# Patient Record
Sex: Female | Born: 1960 | Race: White | Hispanic: No | Marital: Single | Smoking: Current every day smoker
Health system: Southern US, Community
[De-identification: ages and names within clinical notes are randomized; demographics above are authoritative.]

## PROBLEM LIST (undated history)

## (undated) DIAGNOSIS — E119 Type 2 diabetes mellitus without complications: Secondary | ICD-10-CM

## (undated) DIAGNOSIS — F329 Major depressive disorder, single episode, unspecified: Secondary | ICD-10-CM

## (undated) DIAGNOSIS — M1990 Unspecified osteoarthritis, unspecified site: Secondary | ICD-10-CM

## (undated) DIAGNOSIS — E1142 Type 2 diabetes mellitus with diabetic polyneuropathy: Secondary | ICD-10-CM

## (undated) DIAGNOSIS — E7800 Pure hypercholesterolemia, unspecified: Secondary | ICD-10-CM

## (undated) DIAGNOSIS — F419 Anxiety disorder, unspecified: Secondary | ICD-10-CM

## (undated) DIAGNOSIS — D519 Vitamin B12 deficiency anemia, unspecified: Secondary | ICD-10-CM

## (undated) DIAGNOSIS — C50919 Malignant neoplasm of unspecified site of unspecified female breast: Secondary | ICD-10-CM

## (undated) DIAGNOSIS — M069 Rheumatoid arthritis, unspecified: Secondary | ICD-10-CM

## (undated) DIAGNOSIS — D0502 Lobular carcinoma in situ of left breast: Secondary | ICD-10-CM

## (undated) DIAGNOSIS — M545 Low back pain, unspecified: Secondary | ICD-10-CM

## (undated) DIAGNOSIS — G8929 Other chronic pain: Secondary | ICD-10-CM

## (undated) DIAGNOSIS — M109 Gout, unspecified: Secondary | ICD-10-CM

## (undated) DIAGNOSIS — F32A Depression, unspecified: Secondary | ICD-10-CM

## (undated) DIAGNOSIS — G709 Myoneural disorder, unspecified: Secondary | ICD-10-CM

## (undated) HISTORY — PX: TONSILLECTOMY: SUR1361

## (undated) HISTORY — DX: Gout, unspecified: M10.9

## (undated) HISTORY — DX: Lobular carcinoma in situ of left breast: D05.02

## (undated) HISTORY — PX: ABDOMINAL HYSTERECTOMY: SHX81

## (undated) HISTORY — PX: DILATION AND CURETTAGE OF UTERUS: SHX78

## (undated) HISTORY — PX: CHOLECYSTECTOMY: SHX55

## (undated) HISTORY — DX: Rheumatoid arthritis, unspecified: M06.9

## (undated) HISTORY — PX: FRACTURE SURGERY: SHX138

---

## 1999-03-13 HISTORY — PX: BACK SURGERY: SHX140

## 2004-11-19 ENCOUNTER — Emergency Department: Payer: Self-pay | Admitting: General Practice

## 2006-03-12 ENCOUNTER — Emergency Department: Payer: Self-pay

## 2007-02-24 ENCOUNTER — Emergency Department (HOSPITAL_COMMUNITY): Admission: EM | Admit: 2007-02-24 | Discharge: 2007-02-25 | Payer: Self-pay | Admitting: Emergency Medicine

## 2007-07-09 ENCOUNTER — Emergency Department (HOSPITAL_COMMUNITY): Admission: EM | Admit: 2007-07-09 | Discharge: 2007-07-09 | Payer: Self-pay | Admitting: Emergency Medicine

## 2008-08-03 ENCOUNTER — Emergency Department: Payer: Self-pay | Admitting: Emergency Medicine

## 2008-08-09 ENCOUNTER — Emergency Department: Payer: Self-pay

## 2008-09-05 ENCOUNTER — Emergency Department (HOSPITAL_COMMUNITY): Admission: EM | Admit: 2008-09-05 | Discharge: 2008-09-05 | Payer: Self-pay | Admitting: Emergency Medicine

## 2008-09-08 ENCOUNTER — Emergency Department (HOSPITAL_COMMUNITY): Admission: EM | Admit: 2008-09-08 | Discharge: 2008-09-08 | Payer: Self-pay | Admitting: Emergency Medicine

## 2008-09-11 ENCOUNTER — Emergency Department (HOSPITAL_COMMUNITY): Admission: EM | Admit: 2008-09-11 | Discharge: 2008-09-11 | Payer: Self-pay | Admitting: Emergency Medicine

## 2009-02-27 ENCOUNTER — Emergency Department: Payer: Self-pay | Admitting: Emergency Medicine

## 2009-06-06 ENCOUNTER — Emergency Department: Payer: Self-pay | Admitting: Internal Medicine

## 2009-06-12 ENCOUNTER — Emergency Department: Payer: Self-pay | Admitting: Emergency Medicine

## 2009-10-31 ENCOUNTER — Emergency Department: Payer: Self-pay | Admitting: Emergency Medicine

## 2009-11-18 ENCOUNTER — Emergency Department: Payer: Self-pay | Admitting: Emergency Medicine

## 2010-03-12 HISTORY — PX: ANTERIOR CERVICAL DECOMP/DISCECTOMY FUSION: SHX1161

## 2010-06-01 ENCOUNTER — Emergency Department (HOSPITAL_COMMUNITY)
Admission: EM | Admit: 2010-06-01 | Discharge: 2010-06-01 | Disposition: A | Discharge status: Home / Self Care | Payer: Medicaid Other | Attending: Emergency Medicine | Admitting: Emergency Medicine

## 2010-06-01 ENCOUNTER — Emergency Department (HOSPITAL_COMMUNITY): Payer: Medicaid Other

## 2010-06-01 DIAGNOSIS — G8929 Other chronic pain: Secondary | ICD-10-CM | POA: Insufficient documentation

## 2010-06-01 DIAGNOSIS — Y92009 Unspecified place in unspecified non-institutional (private) residence as the place of occurrence of the external cause: Secondary | ICD-10-CM | POA: Insufficient documentation

## 2010-06-01 DIAGNOSIS — M546 Pain in thoracic spine: Secondary | ICD-10-CM | POA: Insufficient documentation

## 2010-06-01 DIAGNOSIS — Z79899 Other long term (current) drug therapy: Secondary | ICD-10-CM | POA: Insufficient documentation

## 2010-06-01 DIAGNOSIS — M542 Cervicalgia: Secondary | ICD-10-CM | POA: Insufficient documentation

## 2010-06-01 DIAGNOSIS — E119 Type 2 diabetes mellitus without complications: Secondary | ICD-10-CM | POA: Insufficient documentation

## 2010-06-01 DIAGNOSIS — E7800 Pure hypercholesterolemia, unspecified: Secondary | ICD-10-CM | POA: Insufficient documentation

## 2010-06-01 DIAGNOSIS — S335XXA Sprain of ligaments of lumbar spine, initial encounter: Secondary | ICD-10-CM | POA: Insufficient documentation

## 2010-06-01 DIAGNOSIS — M545 Low back pain, unspecified: Secondary | ICD-10-CM | POA: Insufficient documentation

## 2010-06-01 DIAGNOSIS — W19XXXA Unspecified fall, initial encounter: Secondary | ICD-10-CM | POA: Insufficient documentation

## 2010-06-19 LAB — DIFFERENTIAL
Basophils Absolute: 0.1 10*3/uL (ref 0.0–0.1)
Basophils Absolute: 0.1 10*3/uL (ref 0.0–0.1)
Basophils Relative: 1 % (ref 0–1)
Basophils Relative: 1 % (ref 0–1)
Eosinophils Absolute: 0.2 10*3/uL (ref 0.0–0.7)
Eosinophils Absolute: 0.4 10*3/uL (ref 0.0–0.7)
Eosinophils Relative: 2 % (ref 0–5)
Eosinophils Relative: 5 % (ref 0–5)
Lymphocytes Relative: 19 % (ref 12–46)
Lymphocytes Relative: 24 % (ref 12–46)
Lymphs Abs: 2 10*3/uL (ref 0.7–4.0)
Lymphs Abs: 2 10*3/uL (ref 0.7–4.0)
Monocytes Absolute: 0.5 10*3/uL (ref 0.1–1.0)
Monocytes Absolute: 0.7 10*3/uL (ref 0.1–1.0)
Monocytes Relative: 6 % (ref 3–12)
Monocytes Relative: 7 % (ref 3–12)
Neutro Abs: 5.4 10*3/uL (ref 1.7–7.7)
Neutro Abs: 7.4 10*3/uL (ref 1.7–7.7)
Neutrophils Relative %: 65 % (ref 43–77)
Neutrophils Relative %: 72 % (ref 43–77)

## 2010-06-19 LAB — COMPREHENSIVE METABOLIC PANEL
ALT: 25 U/L (ref 0–35)
AST: 28 U/L (ref 0–37)
Albumin: 4 g/dL (ref 3.5–5.2)
Alkaline Phosphatase: 86 U/L (ref 39–117)
BUN: 8 mg/dL (ref 6–23)
CO2: 22 mEq/L (ref 19–32)
Calcium: 9.5 mg/dL (ref 8.4–10.5)
Chloride: 105 mEq/L (ref 96–112)
Creatinine, Ser: 0.74 mg/dL (ref 0.4–1.2)
GFR calc Af Amer: >60 mL/min (ref >60)
GFR calc non Af Amer: >60 mL/min (ref >60)
Glucose, Bld: 119 mg/dL — ABNORMAL HIGH (ref 70–99)
Potassium: 3.8 mEq/L (ref 3.5–5.1)
Sodium: 137 mEq/L (ref 135–145)
Total Bilirubin: 0.6 mg/dL (ref 0.3–1.2)
Total Protein: 7.3 g/dL (ref 6.0–8.3)

## 2010-06-19 LAB — CBC
HCT: 38.8 % (ref 36.0–46.0)
HCT: 41.4 % (ref 36.0–46.0)
Hemoglobin: 13.2 g/dL (ref 12.0–15.0)
Hemoglobin: 13.9 g/dL (ref 12.0–15.0)
MCHC: 33.6 g/dL (ref 30.0–36.0)
MCHC: 34 g/dL (ref 30.0–36.0)
MCV: 88.5 fL (ref 78.0–100.0)
MCV: 89.2 fL (ref 78.0–100.0)
Platelets: 263 10*3/uL (ref 150–400)
Platelets: 308 10*3/uL (ref 150–400)
RBC: 4.34 MIL/uL (ref 3.87–5.11)
RBC: 4.68 MIL/uL (ref 3.87–5.11)
RDW: 14.5 % (ref 11.5–15.5)
RDW: 14.7 % (ref 11.5–15.5)
WBC: 10.3 10*3/uL (ref 4.0–10.5)
WBC: 8.4 10*3/uL (ref 4.0–10.5)

## 2010-06-19 LAB — BASIC METABOLIC PANEL
BUN: 8 mg/dL (ref 6–23)
CO2: 26 mEq/L (ref 19–32)
Calcium: 9.2 mg/dL (ref 8.4–10.5)
Chloride: 107 mEq/L (ref 96–112)
Creatinine, Ser: 0.59 mg/dL (ref 0.4–1.2)
GFR calc Af Amer: >60 mL/min (ref >60)
GFR calc non Af Amer: >60 mL/min (ref >60)
Glucose, Bld: 139 mg/dL — ABNORMAL HIGH (ref 70–99)
Potassium: 4 mEq/L (ref 3.5–5.1)
Sodium: 140 mEq/L (ref 135–145)

## 2010-06-19 LAB — CULTURE, BLOOD (ROUTINE X 2): Culture: NO GROWTH

## 2010-07-09 ENCOUNTER — Emergency Department: Payer: Self-pay | Admitting: *Deleted

## 2010-07-28 ENCOUNTER — Ambulatory Visit: Payer: Self-pay | Admitting: Internal Medicine

## 2010-10-14 ENCOUNTER — Emergency Department (HOSPITAL_COMMUNITY)
Admission: EM | Admit: 2010-10-14 | Discharge: 2010-10-14 | Disposition: A | Discharge status: Home / Self Care | Payer: Medicaid Other | Attending: Emergency Medicine | Admitting: Emergency Medicine

## 2010-10-14 DIAGNOSIS — M5430 Sciatica, unspecified side: Secondary | ICD-10-CM | POA: Insufficient documentation

## 2010-10-14 DIAGNOSIS — M545 Low back pain, unspecified: Secondary | ICD-10-CM | POA: Insufficient documentation

## 2010-10-14 DIAGNOSIS — M79609 Pain in unspecified limb: Secondary | ICD-10-CM | POA: Insufficient documentation

## 2010-10-14 DIAGNOSIS — E119 Type 2 diabetes mellitus without complications: Secondary | ICD-10-CM | POA: Insufficient documentation

## 2010-10-14 DIAGNOSIS — G8929 Other chronic pain: Secondary | ICD-10-CM | POA: Insufficient documentation

## 2010-10-14 DIAGNOSIS — Z79899 Other long term (current) drug therapy: Secondary | ICD-10-CM | POA: Insufficient documentation

## 2010-10-14 DIAGNOSIS — E7800 Pure hypercholesterolemia, unspecified: Secondary | ICD-10-CM | POA: Insufficient documentation

## 2010-10-27 ENCOUNTER — Emergency Department (HOSPITAL_COMMUNITY)
Admission: EM | Admit: 2010-10-27 | Discharge: 2010-10-27 | Disposition: A | Discharge status: Home / Self Care | Payer: Medicaid Other | Attending: Emergency Medicine | Admitting: Emergency Medicine

## 2010-10-27 DIAGNOSIS — M545 Low back pain, unspecified: Secondary | ICD-10-CM | POA: Insufficient documentation

## 2010-10-27 DIAGNOSIS — E119 Type 2 diabetes mellitus without complications: Secondary | ICD-10-CM | POA: Insufficient documentation

## 2010-10-27 DIAGNOSIS — G8929 Other chronic pain: Secondary | ICD-10-CM | POA: Insufficient documentation

## 2010-10-27 DIAGNOSIS — E789 Disorder of lipoprotein metabolism, unspecified: Secondary | ICD-10-CM | POA: Insufficient documentation

## 2010-10-27 DIAGNOSIS — M79609 Pain in unspecified limb: Secondary | ICD-10-CM | POA: Insufficient documentation

## 2010-10-27 DIAGNOSIS — Z9889 Other specified postprocedural states: Secondary | ICD-10-CM | POA: Insufficient documentation

## 2010-10-27 DIAGNOSIS — Z79899 Other long term (current) drug therapy: Secondary | ICD-10-CM | POA: Insufficient documentation

## 2010-10-27 DIAGNOSIS — M5430 Sciatica, unspecified side: Secondary | ICD-10-CM | POA: Insufficient documentation

## 2010-11-24 ENCOUNTER — Emergency Department (HOSPITAL_COMMUNITY)
Admission: EM | Admit: 2010-11-24 | Discharge: 2010-11-24 | Disposition: A | Discharge status: Home / Self Care | Payer: Medicaid Other | Attending: Emergency Medicine | Admitting: Emergency Medicine

## 2010-11-24 DIAGNOSIS — G8929 Other chronic pain: Secondary | ICD-10-CM | POA: Insufficient documentation

## 2010-11-24 DIAGNOSIS — M545 Low back pain, unspecified: Secondary | ICD-10-CM | POA: Insufficient documentation

## 2010-11-24 DIAGNOSIS — E119 Type 2 diabetes mellitus without complications: Secondary | ICD-10-CM | POA: Insufficient documentation

## 2010-11-24 DIAGNOSIS — E7800 Pure hypercholesterolemia, unspecified: Secondary | ICD-10-CM | POA: Insufficient documentation

## 2010-11-24 DIAGNOSIS — Z79899 Other long term (current) drug therapy: Secondary | ICD-10-CM | POA: Insufficient documentation

## 2010-12-06 ENCOUNTER — Other Ambulatory Visit: Payer: Self-pay | Admitting: *Deleted

## 2010-12-06 MED ORDER — CYANOCOBALAMIN 1000 MCG/ML IJ SOLN: 1000.0000 ug | INTRAMUSCULAR | Status: DC

## 2010-12-15 ENCOUNTER — Emergency Department (HOSPITAL_COMMUNITY)
Admission: EM | Admit: 2010-12-15 | Discharge: 2010-12-15 | Disposition: A | Discharge status: Home / Self Care | Payer: Medicaid Other | Attending: Emergency Medicine | Admitting: Emergency Medicine

## 2010-12-15 DIAGNOSIS — Z9889 Other specified postprocedural states: Secondary | ICD-10-CM | POA: Insufficient documentation

## 2010-12-15 DIAGNOSIS — G8929 Other chronic pain: Secondary | ICD-10-CM | POA: Insufficient documentation

## 2010-12-15 DIAGNOSIS — F329 Major depressive disorder, single episode, unspecified: Secondary | ICD-10-CM | POA: Insufficient documentation

## 2010-12-15 DIAGNOSIS — Z79899 Other long term (current) drug therapy: Secondary | ICD-10-CM | POA: Insufficient documentation

## 2010-12-15 DIAGNOSIS — M79609 Pain in unspecified limb: Secondary | ICD-10-CM | POA: Insufficient documentation

## 2010-12-15 DIAGNOSIS — E789 Disorder of lipoprotein metabolism, unspecified: Secondary | ICD-10-CM | POA: Insufficient documentation

## 2010-12-15 DIAGNOSIS — F3289 Other specified depressive episodes: Secondary | ICD-10-CM | POA: Insufficient documentation

## 2010-12-15 DIAGNOSIS — M545 Low back pain, unspecified: Secondary | ICD-10-CM | POA: Insufficient documentation

## 2010-12-15 DIAGNOSIS — M542 Cervicalgia: Secondary | ICD-10-CM | POA: Insufficient documentation

## 2010-12-15 DIAGNOSIS — E119 Type 2 diabetes mellitus without complications: Secondary | ICD-10-CM | POA: Insufficient documentation

## 2010-12-25 ENCOUNTER — Emergency Department: Payer: Self-pay | Admitting: Emergency Medicine

## 2011-01-01 ENCOUNTER — Emergency Department: Payer: Self-pay | Admitting: Emergency Medicine

## 2011-01-08 ENCOUNTER — Ambulatory Visit (HOSPITAL_BASED_OUTPATIENT_CLINIC_OR_DEPARTMENT_OTHER): Payer: Medicaid Other | Admitting: Physical Medicine & Rehabilitation

## 2011-01-08 ENCOUNTER — Encounter: Payer: Medicaid Other | Attending: Physical Medicine & Rehabilitation

## 2011-01-08 DIAGNOSIS — M542 Cervicalgia: Secondary | ICD-10-CM | POA: Insufficient documentation

## 2011-01-08 DIAGNOSIS — M4712 Other spondylosis with myelopathy, cervical region: Secondary | ICD-10-CM | POA: Insufficient documentation

## 2011-01-08 DIAGNOSIS — M47817 Spondylosis without myelopathy or radiculopathy, lumbosacral region: Secondary | ICD-10-CM

## 2011-01-08 DIAGNOSIS — M549 Dorsalgia, unspecified: Secondary | ICD-10-CM | POA: Insufficient documentation

## 2011-01-08 DIAGNOSIS — M25519 Pain in unspecified shoulder: Secondary | ICD-10-CM | POA: Insufficient documentation

## 2011-01-08 DIAGNOSIS — M79609 Pain in unspecified limb: Secondary | ICD-10-CM | POA: Insufficient documentation

## 2011-01-08 DIAGNOSIS — R209 Unspecified disturbances of skin sensation: Secondary | ICD-10-CM | POA: Insufficient documentation
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
## 2011-01-16 ENCOUNTER — Ambulatory Visit: Payer: Self-pay | Admitting: Internal Medicine

## 2011-01-25 ENCOUNTER — Ambulatory Visit: Payer: Self-pay | Admitting: Internal Medicine

## 2011-01-30 ENCOUNTER — Ambulatory Visit: Payer: Medicaid Other | Admitting: Physical Medicine & Rehabilitation

## 2011-02-06 ENCOUNTER — Emergency Department: Payer: Self-pay | Admitting: Emergency Medicine

## 2011-05-31 ENCOUNTER — Ambulatory Visit: Payer: Self-pay | Admitting: Specialist

## 2011-05-31 DIAGNOSIS — D229 Melanocytic nevi, unspecified: Secondary | ICD-10-CM | POA: Insufficient documentation

## 2011-05-31 DIAGNOSIS — D219 Benign neoplasm of connective and other soft tissue, unspecified: Secondary | ICD-10-CM | POA: Insufficient documentation

## 2011-06-28 DIAGNOSIS — R944 Abnormal results of kidney function studies: Secondary | ICD-10-CM | POA: Insufficient documentation

## 2011-07-02 ENCOUNTER — Ambulatory Visit: Payer: Self-pay | Admitting: Nephrology

## 2011-07-22 ENCOUNTER — Emergency Department: Payer: Self-pay | Admitting: Emergency Medicine

## 2011-09-03 ENCOUNTER — Emergency Department: Payer: Self-pay | Admitting: Emergency Medicine

## 2011-10-12 DIAGNOSIS — E1142 Type 2 diabetes mellitus with diabetic polyneuropathy: Secondary | ICD-10-CM | POA: Insufficient documentation

## 2011-12-25 ENCOUNTER — Emergency Department: Payer: Self-pay | Admitting: Unknown Physician Specialty

## 2012-01-14 DIAGNOSIS — E785 Hyperlipidemia, unspecified: Secondary | ICD-10-CM | POA: Insufficient documentation

## 2012-01-14 DIAGNOSIS — M549 Dorsalgia, unspecified: Secondary | ICD-10-CM | POA: Insufficient documentation

## 2012-01-14 DIAGNOSIS — I10 Essential (primary) hypertension: Secondary | ICD-10-CM | POA: Insufficient documentation

## 2012-01-14 DIAGNOSIS — Z23 Encounter for immunization: Secondary | ICD-10-CM | POA: Insufficient documentation

## 2012-01-14 DIAGNOSIS — E119 Type 2 diabetes mellitus without complications: Secondary | ICD-10-CM | POA: Insufficient documentation

## 2012-02-01 ENCOUNTER — Emergency Department: Payer: Self-pay | Admitting: Emergency Medicine

## 2012-02-25 ENCOUNTER — Emergency Department: Payer: Self-pay | Admitting: Emergency Medicine

## 2012-03-12 HISTORY — PX: BREAST BIOPSY: SHX20

## 2012-05-26 ENCOUNTER — Other Ambulatory Visit: Payer: Self-pay | Admitting: Neurosurgery

## 2012-05-26 DIAGNOSIS — M48061 Spinal stenosis, lumbar region without neurogenic claudication: Secondary | ICD-10-CM

## 2012-06-05 ENCOUNTER — Ambulatory Visit
Admission: RE | Admit: 2012-06-05 | Discharge: 2012-06-05 | Disposition: A | Discharge status: Home / Self Care | Payer: Medicaid Other | Source: Ambulatory Visit | Attending: Neurosurgery | Admitting: Neurosurgery

## 2012-06-05 VITALS — BP 104/57 | HR 73 | Ht 68.0 in | Wt 203.0 lb

## 2012-06-05 DIAGNOSIS — M48061 Spinal stenosis, lumbar region without neurogenic claudication: Secondary | ICD-10-CM

## 2012-06-05 MED ORDER — MEPERIDINE HCL 100 MG/ML IJ SOLN
100.0000 mg | Freq: Once | INTRAMUSCULAR | Status: AC
  Administered 2012-06-05: 100 mg via INTRAMUSCULAR

## 2012-06-05 MED ORDER — ONDANSETRON HCL 4 MG/2ML IJ SOLN
4.0000 mg | Freq: Once | INTRAMUSCULAR | Status: AC
  Administered 2012-06-05: 4 mg via INTRAMUSCULAR

## 2012-06-05 MED ORDER — IOHEXOL 180 MG/ML  SOLN
15.0000 mL | Freq: Once | INTRAMUSCULAR | Status: AC | PRN
  Administered 2012-06-05: 15 mL via INTRATHECAL

## 2012-06-05 MED ORDER — DIAZEPAM 5 MG PO TABS
10.0000 mg | ORAL_TABLET | Freq: Once | ORAL | Status: AC
  Administered 2012-06-05: 10 mg via ORAL

## 2012-06-05 NOTE — Progress Notes (Signed)
Pt states she has been off cymbalta for the past 2 days. JKL

## 2012-06-19 ENCOUNTER — Other Ambulatory Visit: Payer: Self-pay | Admitting: Neurosurgery

## 2012-06-26 ENCOUNTER — Encounter (HOSPITAL_COMMUNITY): Payer: Self-pay | Admitting: Pharmacy Technician

## 2012-07-01 ENCOUNTER — Encounter (HOSPITAL_COMMUNITY)
Admission: RE | Admit: 2012-07-01 | Discharge: 2012-07-01 | Disposition: A | Discharge status: Home / Self Care | Payer: Medicaid Other | Source: Ambulatory Visit | Attending: Neurosurgery | Admitting: Neurosurgery

## 2012-07-01 ENCOUNTER — Encounter (HOSPITAL_COMMUNITY): Payer: Self-pay

## 2012-07-01 HISTORY — DX: Depression, unspecified: F32.A

## 2012-07-01 HISTORY — DX: Myoneural disorder, unspecified: G70.9

## 2012-07-01 HISTORY — DX: Major depressive disorder, single episode, unspecified: F32.9

## 2012-07-01 HISTORY — DX: Pure hypercholesterolemia, unspecified: E78.00

## 2012-07-01 LAB — BASIC METABOLIC PANEL
BUN: 11 mg/dL (ref 6–23)
CO2: 26 mEq/L (ref 19–32)
Calcium: 9.9 mg/dL (ref 8.4–10.5)
Chloride: 102 mEq/L (ref 96–112)
Creatinine, Ser: 0.76 mg/dL (ref 0.50–1.10)
GFR calc Af Amer: >90 mL/min (ref >90)
GFR calc non Af Amer: >90 mL/min (ref >90)
Glucose, Bld: 82 mg/dL (ref 70–99)
Potassium: 3.9 mEq/L (ref 3.5–5.1)
Sodium: 139 mEq/L (ref 135–145)

## 2012-07-01 LAB — CBC
HCT: 36.5 % (ref 36.0–46.0)
Hemoglobin: 12.4 g/dL (ref 12.0–15.0)
MCH: 29.9 pg (ref 26.0–34.0)
MCHC: 34 g/dL (ref 30.0–36.0)
MCV: 88 fL (ref 78.0–100.0)
Platelets: 260 10*3/uL (ref 150–400)
RBC: 4.15 MIL/uL (ref 3.87–5.11)
RDW: 13.2 % (ref 11.5–15.5)
WBC: 9 10*3/uL (ref 4.0–10.5)

## 2012-07-01 LAB — TYPE AND SCREEN
ABO/RH(D): A POS
Antibody Screen: NEGATIVE

## 2012-07-01 LAB — SURGICAL PCR SCREEN
MRSA, PCR: NEGATIVE
Staphylococcus aureus: NEGATIVE

## 2012-07-01 LAB — ABO/RH: ABO/RH(D): A POS

## 2012-07-01 NOTE — Pre-Procedure Instructions (Signed)
Holly Espinoza  07/01/2012   Your procedure is scheduled on:  July 08, 2012  Report to [facility] at 5:30 AM.  Call this number if you have problems the morning of surgery: [phone]   Remember:   Do not eat food or drink liquids after midnight.   Take these medicines the morning of surgery with A SIP OF WATER: cymbalta(duloxetine), neurontin(gabapentin), percocet as needed   Do not wear jewelry, make-up or nail polish.  Do not wear lotions, powders, or perfumes. You may wear deodorant.  Do not shave 48 hours prior to surgery. Men may shave face and neck.  Do not bring valuables to the hospital.  Contacts, dentures or bridgework may not be worn into surgery.  Leave suitcase in the car. After surgery it may be brought to your room.  For patients admitted to the hospital, checkout time is 11:00 AM the day of  discharge.   Patients discharged the day of surgery will not be allowed to drive  home.  Name and phone number of your driver: family/friend  Special Instructions: Shower using CHG 2 nights before surgery and the night before surgery.  If you shower the day of surgery use CHG.  Use special wash - you have one bottle of CHG for all showers.  You should use approximately 1/3 of the bottle for each shower.   Please read over the following fact sheets that you were given: Pain Booklet, Coughing and Deep Breathing and Surgical Site Infection Prevention

## 2012-07-07 MED ORDER — CEFAZOLIN SODIUM-DEXTROSE 2-3 GM-% IV SOLR
2.0000 g | INTRAVENOUS | Status: AC
  Administered 2012-07-08: 1 g via INTRAVENOUS
  Administered 2012-07-08: 2 g via INTRAVENOUS
  Filled 2012-07-07: qty 50

## 2012-07-08 ENCOUNTER — Ambulatory Visit (HOSPITAL_COMMUNITY): Payer: Medicaid Other

## 2012-07-08 ENCOUNTER — Ambulatory Visit (HOSPITAL_COMMUNITY): Payer: Medicaid Other | Admitting: Anesthesiology

## 2012-07-08 ENCOUNTER — Encounter (HOSPITAL_COMMUNITY)
Admission: RE | Disposition: A | Discharge status: Home / Self Care | Payer: Self-pay | Source: Ambulatory Visit | Attending: Neurosurgery

## 2012-07-08 ENCOUNTER — Inpatient Hospital Stay (HOSPITAL_COMMUNITY)
Admission: RE | Admit: 2012-07-08 | Discharge: 2012-07-12 | DRG: 460 | Disposition: A | Discharge status: Home / Self Care | Payer: Medicaid Other | Source: Ambulatory Visit | Attending: Neurosurgery | Admitting: Neurosurgery

## 2012-07-08 ENCOUNTER — Encounter (HOSPITAL_COMMUNITY): Payer: Self-pay | Admitting: *Deleted

## 2012-07-08 ENCOUNTER — Encounter (HOSPITAL_COMMUNITY): Payer: Self-pay | Admitting: Anesthesiology

## 2012-07-08 DIAGNOSIS — G709 Myoneural disorder, unspecified: Secondary | ICD-10-CM | POA: Diagnosis present

## 2012-07-08 DIAGNOSIS — Z794 Long term (current) use of insulin: Secondary | ICD-10-CM

## 2012-07-08 DIAGNOSIS — Z7982 Long term (current) use of aspirin: Secondary | ICD-10-CM

## 2012-07-08 DIAGNOSIS — E119 Type 2 diabetes mellitus without complications: Secondary | ICD-10-CM | POA: Diagnosis present

## 2012-07-08 DIAGNOSIS — M48061 Spinal stenosis, lumbar region without neurogenic claudication: Principal | ICD-10-CM | POA: Diagnosis present

## 2012-07-08 HISTORY — DX: Low back pain, unspecified: M54.50

## 2012-07-08 HISTORY — DX: Type 2 diabetes mellitus with diabetic polyneuropathy: E11.42

## 2012-07-08 HISTORY — DX: Low back pain: M54.5

## 2012-07-08 HISTORY — DX: Other chronic pain: G89.29

## 2012-07-08 HISTORY — DX: Type 2 diabetes mellitus without complications: E11.9

## 2012-07-08 HISTORY — DX: Vitamin B12 deficiency anemia, unspecified: D51.9

## 2012-07-08 HISTORY — DX: Unspecified osteoarthritis, unspecified site: M19.90

## 2012-07-08 HISTORY — PX: POSTERIOR LUMBAR FUSION: SHX6036

## 2012-07-08 LAB — GLUCOSE, CAPILLARY
Glucose-Capillary: 118 mg/dL — ABNORMAL HIGH (ref 70–99)
Glucose-Capillary: 161 mg/dL — ABNORMAL HIGH (ref 70–99)
Glucose-Capillary: 126 mg/dL — ABNORMAL HIGH (ref 70–99)
Glucose-Capillary: 146 mg/dL — ABNORMAL HIGH (ref 70–99)

## 2012-07-08 SURGERY — POSTERIOR LUMBAR FUSION 2 LEVEL
Anesthesia: General | Site: Back | Wound class: Clean

## 2012-07-08 MED ORDER — PROPOFOL 10 MG/ML IV BOLUS
INTRAVENOUS | Status: DC | PRN
  Administered 2012-07-08: 30 mg via INTRAVENOUS
  Administered 2012-07-08: 200 mg via INTRAVENOUS
  Administered 2012-07-08: 50 mg via INTRAVENOUS

## 2012-07-08 MED ORDER — ROCURONIUM BROMIDE 100 MG/10ML IV SOLN
INTRAVENOUS | Status: DC | PRN
  Administered 2012-07-08: 50 mg via INTRAVENOUS

## 2012-07-08 MED ORDER — NEOSTIGMINE METHYLSULFATE 1 MG/ML IJ SOLN
INTRAMUSCULAR | Status: DC | PRN
  Administered 2012-07-08: 4 mg via INTRAVENOUS

## 2012-07-08 MED ORDER — GLYCOPYRROLATE 0.2 MG/ML IJ SOLN
INTRAMUSCULAR | Status: DC | PRN
  Administered 2012-07-08: .6 mg via INTRAVENOUS

## 2012-07-08 MED ORDER — HYDROMORPHONE HCL PF 1 MG/ML IJ SOLN
INTRAMUSCULAR | Status: AC
  Administered 2012-07-08: 0.5 mg via INTRAVENOUS
  Filled 2012-07-08: qty 1

## 2012-07-08 MED ORDER — OXYCODONE-ACETAMINOPHEN 5-325 MG PO TABS
1.0000 | ORAL_TABLET | ORAL | Status: DC | PRN
  Administered 2012-07-08: 2 via ORAL
  Administered 2012-07-08 (×2): 1 via ORAL
  Administered 2012-07-09 (×2): 2 via ORAL
  Filled 2012-07-08: qty 1
  Filled 2012-07-08 (×3): qty 2

## 2012-07-08 MED ORDER — MEPERIDINE HCL 25 MG/ML IJ SOLN: 6.2500 mg | INTRAMUSCULAR | Status: DC | PRN

## 2012-07-08 MED ORDER — VECURONIUM BROMIDE 10 MG IV SOLR
INTRAVENOUS | Status: DC | PRN
  Administered 2012-07-08: 1 mg via INTRAVENOUS
  Administered 2012-07-08: 2 mg via INTRAVENOUS
  Administered 2012-07-08: 3 mg via INTRAVENOUS
  Administered 2012-07-08: 2 mg via INTRAVENOUS
  Administered 2012-07-08: 4 mg via INTRAVENOUS
  Administered 2012-07-08 (×2): 2 mg via INTRAVENOUS

## 2012-07-08 MED ORDER — CYCLOBENZAPRINE HCL 10 MG PO TABS
10.0000 mg | ORAL_TABLET | Freq: Three times a day (TID) | ORAL | Status: DC | PRN
  Administered 2012-07-08 – 2012-07-12 (×9): 10 mg via ORAL
  Filled 2012-07-08 (×8): qty 1

## 2012-07-08 MED ORDER — DEXTROSE 5 % IV SOLN
INTRAVENOUS | Status: DC | PRN
  Administered 2012-07-08 (×3): via INTRAVENOUS

## 2012-07-08 MED ORDER — SODIUM CHLORIDE 0.9 % IV SOLN
INTRAVENOUS | Status: DC | PRN
  Administered 2012-07-08: 13:00:00 via INTRAVENOUS

## 2012-07-08 MED ORDER — SODIUM CHLORIDE 0.9 % IV SOLN: 250.0000 mL | INTRAVENOUS | Status: DC

## 2012-07-08 MED ORDER — HEMOSTATIC AGENTS (NO CHARGE) OPTIME
TOPICAL | Status: DC | PRN
  Administered 2012-07-08 (×2): 1 via TOPICAL

## 2012-07-08 MED ORDER — INSULIN ASPART 100 UNIT/ML ~~LOC~~ SOLN
0.0000 [IU] | Freq: Three times a day (TID) | SUBCUTANEOUS | Status: DC
  Administered 2012-07-09: 2 [IU] via SUBCUTANEOUS

## 2012-07-08 MED ORDER — DEXAMETHASONE SODIUM PHOSPHATE 4 MG/ML IJ SOLN: 4.0000 mg | Freq: Four times a day (QID) | INTRAMUSCULAR | Status: AC

## 2012-07-08 MED ORDER — MENTHOL 3 MG MT LOZG: 1.0000 | LOZENGE | OROMUCOSAL | Status: DC | PRN

## 2012-07-08 MED ORDER — ACETAMINOPHEN 325 MG PO TABS
650.0000 mg | ORAL_TABLET | ORAL | Status: DC | PRN
  Administered 2012-07-11 – 2012-07-12 (×3): 650 mg via ORAL
  Filled 2012-07-08 (×3): qty 2

## 2012-07-08 MED ORDER — LIDOCAINE HCL (CARDIAC) 20 MG/ML IV SOLN
INTRAVENOUS | Status: DC | PRN
  Administered 2012-07-08: 50 mg via INTRAVENOUS

## 2012-07-08 MED ORDER — SODIUM CHLORIDE 0.9 % IV SOLN
INTRAVENOUS | Status: AC
  Filled 2012-07-08: qty 500

## 2012-07-08 MED ORDER — ACETAMINOPHEN 10 MG/ML IV SOLN
INTRAVENOUS | Status: AC
  Administered 2012-07-08: 1000 mg via INTRAVENOUS
  Filled 2012-07-08: qty 100

## 2012-07-08 MED ORDER — SODIUM CHLORIDE 0.9 % IJ SOLN: 3.0000 mL | INTRAMUSCULAR | Status: DC | PRN

## 2012-07-08 MED ORDER — DEXAMETHASONE 4 MG PO TABS
4.0000 mg | ORAL_TABLET | Freq: Four times a day (QID) | ORAL | Status: AC
  Administered 2012-07-08 – 2012-07-09 (×2): 4 mg via ORAL
  Filled 2012-07-08 (×2): qty 1

## 2012-07-08 MED ORDER — DEXAMETHASONE SODIUM PHOSPHATE 10 MG/ML IJ SOLN
10.0000 mg | INTRAMUSCULAR | Status: AC
  Administered 2012-07-08: 10 mg via INTRAVENOUS
  Filled 2012-07-08: qty 1

## 2012-07-08 MED ORDER — OXYCODONE HCL 5 MG PO TABS: 5.0000 mg | ORAL_TABLET | Freq: Once | ORAL | Status: DC | PRN

## 2012-07-08 MED ORDER — SODIUM CHLORIDE 0.9 % IJ SOLN
3.0000 mL | Freq: Two times a day (BID) | INTRAMUSCULAR | Status: DC
  Administered 2012-07-09 – 2012-07-10 (×3): 3 mL via INTRAVENOUS

## 2012-07-08 MED ORDER — METFORMIN HCL 500 MG PO TABS
1000.0000 mg | ORAL_TABLET | Freq: Two times a day (BID) | ORAL | Status: DC
  Administered 2012-07-08 – 2012-07-12 (×7): 1000 mg via ORAL
  Filled 2012-07-08 (×10): qty 2

## 2012-07-08 MED ORDER — ARTIFICIAL TEARS OP OINT
TOPICAL_OINTMENT | OPHTHALMIC | Status: DC | PRN
  Administered 2012-07-08: 1 via OPHTHALMIC

## 2012-07-08 MED ORDER — HYDROMORPHONE HCL PF 1 MG/ML IJ SOLN
INTRAMUSCULAR | Status: DC | PRN
  Administered 2012-07-08 (×2): .25 mg via INTRAVENOUS
  Administered 2012-07-08: 0.5 mg via INTRAVENOUS

## 2012-07-08 MED ORDER — PHENOL 1.4 % MT LIQD: 1.0000 | OROMUCOSAL | Status: DC | PRN

## 2012-07-08 MED ORDER — CYCLOBENZAPRINE HCL 10 MG PO TABS
ORAL_TABLET | ORAL | Status: AC
  Filled 2012-07-08: qty 1

## 2012-07-08 MED ORDER — THROMBIN 20000 UNITS EX SOLR
CUTANEOUS | Status: DC | PRN
  Administered 2012-07-08 (×2): via TOPICAL

## 2012-07-08 MED ORDER — DULOXETINE HCL 60 MG PO CPEP
60.0000 mg | ORAL_CAPSULE | Freq: Every day | ORAL | Status: DC
  Administered 2012-07-09 – 2012-07-12 (×4): 60 mg via ORAL
  Filled 2012-07-08 (×4): qty 1

## 2012-07-08 MED ORDER — OXYCODONE HCL 5 MG/5ML PO SOLN: 5.0000 mg | Freq: Once | ORAL | Status: DC | PRN

## 2012-07-08 MED ORDER — POTASSIUM CHLORIDE IN NACL 20-0.9 MEQ/L-% IV SOLN
INTRAVENOUS | Status: DC
  Administered 2012-07-08: 17:00:00 via INTRAVENOUS
  Filled 2012-07-08 (×5): qty 1000

## 2012-07-08 MED ORDER — ACETAMINOPHEN 650 MG RE SUPP: 650.0000 mg | RECTAL | Status: DC | PRN

## 2012-07-08 MED ORDER — HYDROMORPHONE HCL PF 1 MG/ML IJ SOLN
1.0000 mg | INTRAMUSCULAR | Status: DC | PRN
  Administered 2012-07-08: 1.5 mg via INTRAMUSCULAR
  Administered 2012-07-09: 1 mg via INTRAMUSCULAR
  Administered 2012-07-10: 1.5 mg via INTRAMUSCULAR
  Administered 2012-07-10 (×3): 1 mg via INTRAMUSCULAR
  Administered 2012-07-11: 1.5 mg via INTRAMUSCULAR
  Filled 2012-07-08 (×4): qty 1
  Filled 2012-07-08: qty 2
  Filled 2012-07-08 (×2): qty 1
  Filled 2012-07-08: qty 2

## 2012-07-08 MED ORDER — BUPIVACAINE HCL (PF) 0.5 % IJ SOLN
INTRAMUSCULAR | Status: DC | PRN
  Administered 2012-07-08: 30 mL

## 2012-07-08 MED ORDER — HYDROMORPHONE HCL PF 1 MG/ML IJ SOLN
0.2500 mg | INTRAMUSCULAR | Status: DC | PRN
  Administered 2012-07-08 (×3): 0.5 mg via INTRAVENOUS

## 2012-07-08 MED ORDER — ONDANSETRON HCL 4 MG/2ML IJ SOLN: 4.0000 mg | INTRAMUSCULAR | Status: DC | PRN

## 2012-07-08 MED ORDER — BACITRACIN 50000 UNITS IM SOLR
INTRAMUSCULAR | Status: AC
  Filled 2012-07-08: qty 1

## 2012-07-08 MED ORDER — LACTATED RINGERS IV SOLN
INTRAVENOUS | Status: DC | PRN
  Administered 2012-07-08 (×4): via INTRAVENOUS

## 2012-07-08 MED ORDER — HYDROMORPHONE HCL PF 1 MG/ML IJ SOLN
INTRAMUSCULAR | Status: AC
  Filled 2012-07-08: qty 1

## 2012-07-08 MED ORDER — PROMETHAZINE HCL 25 MG/ML IJ SOLN: 6.2500 mg | INTRAMUSCULAR | Status: DC | PRN

## 2012-07-08 MED ORDER — CEFAZOLIN SODIUM-DEXTROSE 2-3 GM-% IV SOLR
2.0000 g | Freq: Three times a day (TID) | INTRAVENOUS | Status: AC
  Administered 2012-07-08 – 2012-07-09 (×4): 2 g via INTRAVENOUS
  Filled 2012-07-08 (×4): qty 50

## 2012-07-08 MED ORDER — CEFAZOLIN SODIUM 1-5 GM-% IV SOLN
INTRAVENOUS | Status: AC
  Filled 2012-07-08: qty 50

## 2012-07-08 MED ORDER — GABAPENTIN 300 MG PO CAPS
300.0000 mg | ORAL_CAPSULE | Freq: Three times a day (TID) | ORAL | Status: DC
  Administered 2012-07-08 – 2012-07-12 (×12): 300 mg via ORAL
  Filled 2012-07-08 (×14): qty 1

## 2012-07-08 MED ORDER — FENTANYL CITRATE 0.05 MG/ML IJ SOLN
INTRAMUSCULAR | Status: DC | PRN
  Administered 2012-07-08 (×2): 100 ug via INTRAVENOUS
  Administered 2012-07-08: 50 ug via INTRAVENOUS

## 2012-07-08 MED ORDER — ONDANSETRON HCL 4 MG/2ML IJ SOLN
INTRAMUSCULAR | Status: DC | PRN
  Administered 2012-07-08: 4 mg via INTRAVENOUS

## 2012-07-08 MED ORDER — ATORVASTATIN CALCIUM 40 MG PO TABS
40.0000 mg | ORAL_TABLET | Freq: Every day | ORAL | Status: DC
  Administered 2012-07-08 – 2012-07-11 (×4): 40 mg via ORAL
  Filled 2012-07-08 (×5): qty 1

## 2012-07-08 MED ORDER — SODIUM CHLORIDE 0.9 % IR SOLN
Status: DC | PRN
  Administered 2012-07-08: 08:00:00

## 2012-07-08 MED ORDER — SODIUM CHLORIDE 0.9 % IR SOLN
Status: DC | PRN
  Administered 2012-07-08: 1000 mL

## 2012-07-08 MED ORDER — ZOLPIDEM TARTRATE 5 MG PO TABS
5.0000 mg | ORAL_TABLET | Freq: Every evening | ORAL | Status: DC | PRN
  Administered 2012-07-08 – 2012-07-11 (×4): 5 mg via ORAL
  Filled 2012-07-08 (×4): qty 1

## 2012-07-08 MED ORDER — OXYCODONE-ACETAMINOPHEN 5-325 MG PO TABS
ORAL_TABLET | ORAL | Status: AC
  Filled 2012-07-08: qty 2

## 2012-07-08 MED ORDER — INSULIN ASPART 100 UNIT/ML ~~LOC~~ SOLN
4.0000 [IU] | Freq: Three times a day (TID) | SUBCUTANEOUS | Status: DC
  Administered 2012-07-09: 4 [IU] via SUBCUTANEOUS

## 2012-07-08 MED ORDER — MIDAZOLAM HCL 5 MG/5ML IJ SOLN
INTRAMUSCULAR | Status: DC | PRN
  Administered 2012-07-08: 2 mg via INTRAVENOUS

## 2012-07-08 MED ORDER — INSULIN ASPART 100 UNIT/ML ~~LOC~~ SOLN
0.0000 [IU] | Freq: Every day | SUBCUTANEOUS | Status: DC
Start: 2012-07-08 — End: 2012-07-12

## 2012-07-08 SURGICAL SUPPLY — 59 items
BAG DECANTER FOR FLEXI CONT (MISCELLANEOUS) ×2 IMPLANT
BENZOIN TINCTURE PRP APPL 2/3 (GAUZE/BANDAGES/DRESSINGS) ×2 IMPLANT
BLADE SURG ROTATE 9660 (MISCELLANEOUS) ×2 IMPLANT
BONE EQUIVA 10CC (Bone Implant) ×4 IMPLANT
BRUSH SCRUB EZ PLAIN DRY (MISCELLANEOUS) ×2 IMPLANT
BUR CUTTER 7.0 ROUND (BURR) ×6 IMPLANT
BUR MATCHSTICK NEURO 3.0 LAGG (BURR) ×2 IMPLANT
CANISTER SUCTION 2500CC (MISCELLANEOUS) ×2 IMPLANT
CLOTH BEACON ORANGE TIMEOUT ST (SAFETY) ×2 IMPLANT
CONT SPEC 4OZ CLIKSEAL STRL BL (MISCELLANEOUS) ×4 IMPLANT
COVER BACK TABLE 24X17X13 BIG (DRAPES) IMPLANT
COVER TABLE BACK 60X90 (DRAPES) ×2 IMPLANT
DERMABOND ADVANCED (GAUZE/BANDAGES/DRESSINGS) ×1
DERMABOND ADVANCED .7 DNX12 (GAUZE/BANDAGES/DRESSINGS) ×1 IMPLANT
DRAPE C-ARM 42X72 X-RAY (DRAPES) ×4 IMPLANT
DRAPE LAPAROTOMY 100X72X124 (DRAPES) ×2 IMPLANT
DRAPE SURG 17X23 STRL (DRAPES) ×4 IMPLANT
DRESSING TELFA 8X3 (GAUZE/BANDAGES/DRESSINGS) ×2 IMPLANT
DURAPREP 26ML APPLICATOR (WOUND CARE) ×2 IMPLANT
ELECT REM PT RETURN 9FT ADLT (ELECTROSURGICAL) ×2
ELECTRODE REM PT RTRN 9FT ADLT (ELECTROSURGICAL) ×1 IMPLANT
EVACUATOR 1/8 PVC DRAIN (DRAIN) ×2 IMPLANT
GAUZE SPONGE 4X4 16PLY XRAY LF (GAUZE/BANDAGES/DRESSINGS) ×2 IMPLANT
GLOVE ECLIPSE 7.5 STRL STRAW (GLOVE) ×10 IMPLANT
GLOVE INDICATOR 7.5 STRL GRN (GLOVE) ×2 IMPLANT
GLOVE INDICATOR 8.0 STRL GRN (GLOVE) ×2 IMPLANT
GOWN BRE IMP SLV AUR LG STRL (GOWN DISPOSABLE) ×6 IMPLANT
GOWN BRE IMP SLV AUR XL STRL (GOWN DISPOSABLE) ×4 IMPLANT
GOWN STRL REIN 2XL LVL4 (GOWN DISPOSABLE) ×2 IMPLANT
IMPLANT ARDIS PEEK 109X22 (Orthopedic Implant) ×4 IMPLANT
IMPLANT ARDIS PEEK 8X9X22 (Orthopedic Implant) ×4 IMPLANT
KIT BASIN OR (CUSTOM PROCEDURE TRAY) ×2 IMPLANT
KIT ROOM TURNOVER OR (KITS) ×2 IMPLANT
MILL MEDIUM DISP (BLADE) ×2 IMPLANT
NEEDLE HYPO 22GX1.5 SAFETY (NEEDLE) ×2 IMPLANT
NS IRRIG 1000ML POUR BTL (IV SOLUTION) ×2 IMPLANT
PACK LAMINECTOMY NEURO (CUSTOM PROCEDURE TRAY) ×2 IMPLANT
PAD ARMBOARD 7.5X6 YLW CONV (MISCELLANEOUS) ×6 IMPLANT
PATTIES SURGICAL .75X.75 (GAUZE/BANDAGES/DRESSINGS) ×2 IMPLANT
ROD PERBENT 80MM (Rod) ×4 IMPLANT
SCREW POLY 6.5X40MM (Screw) ×4 IMPLANT
SCREW POLY 6.5X45 (Screw) ×4 IMPLANT
SCREW POLY 6.5X45MM (Screw) ×4 IMPLANT
SPEEDLINK 11 MEDIUM (Rod) ×2 IMPLANT
SPONGE GAUZE 4X4 12PLY (GAUZE/BANDAGES/DRESSINGS) ×2 IMPLANT
SPONGE LAP 4X18 X RAY DECT (DISPOSABLE) IMPLANT
SPONGE SURGIFOAM ABS GEL 100 (HEMOSTASIS) ×2 IMPLANT
STRIP CLOSURE SKIN 1/2X4 (GAUZE/BANDAGES/DRESSINGS) ×2 IMPLANT
SUT VIC AB 0 CT1 18XCR BRD8 (SUTURE) ×1 IMPLANT
SUT VIC AB 0 CT1 8-18 (SUTURE) ×1
SUT VIC AB 2-0 OS6 18 (SUTURE) ×6 IMPLANT
SUT VIC AB 3-0 CP2 18 (SUTURE) ×2 IMPLANT
SYR 20ML ECCENTRIC (SYRINGE) ×2 IMPLANT
TOP CLSR SEQUOIA (Orthopedic Implant) ×12 IMPLANT
TOWEL OR 17X24 6PK STRL BLUE (TOWEL DISPOSABLE) ×4 IMPLANT
TOWEL OR 17X26 10 PK STRL BLUE (TOWEL DISPOSABLE) ×2 IMPLANT
TRAP SPECIMEN MUCOUS 40CC (MISCELLANEOUS) ×2 IMPLANT
TRAY FOLEY CATH 14FRSI W/METER (CATHETERS) ×2 IMPLANT
WATER STERILE IRR 1000ML POUR (IV SOLUTION) ×2 IMPLANT

## 2012-07-08 NOTE — Transfer of Care (Signed)
Immediate Anesthesia Transfer of Care Note  Patient: Holly Espinoza  Procedure(s) Performed: Procedure(s) with comments: POSTERIOR LUMBAR FUSION 2 LEVEL (N/A) - lumbar three-four,lumbar four five  Patient Location: PACU  Anesthesia Type:General  Level of Consciousness: awake, alert , oriented and patient cooperative  Airway & Oxygen Therapy: Patient Spontanous Breathing and Patient connected to nasal cannula oxygen  Post-op Assessment: Report given to PACU RN and Post -op Vital signs reviewed and stable  Post vital signs: Reviewed and stable  Complications: No apparent anesthesia complications

## 2012-07-08 NOTE — Preoperative (Signed)
Beta Blockers   Reason not to administer Beta Blockers:Not Applicable 

## 2012-07-08 NOTE — Anesthesia Preprocedure Evaluation (Addendum)
Anesthesia Evaluation  Patient identified by MRN, date of birth, ID band Patient awake    Reviewed: Allergy & Precautions, H&P , NPO status , Patient's Chart, lab work & pertinent test results  Airway Mallampati: II TM Distance: >3 FB Neck ROM: Limited    Dental  (+) Teeth Intact and Dental Advisory Given   Pulmonary neg pulmonary ROS, Current Smoker,  breath sounds clear to auscultation  Pulmonary exam normal       Cardiovascular Exercise Tolerance: Good negative cardio ROS  Rhythm:Regular Rate:Normal  01-Jul-2012 10:41:34 [facility] ROUTINE RECORD Normal sinus rhythm Low voltage QRS Borderline ECG No previous tracing   Neuro/Psych PSYCHIATRIC DISORDERS Depression s/p ACDF  Neuromuscular disease    GI/Hepatic negative GI ROS, Neg liver ROS,   Endo/Other  diabetes, Well Controlled, Type 2, Oral Hypoglycemic AgentsGlucose 118 this am  Renal/GU negative Renal ROS     Musculoskeletal  (+) Arthritis -, Osteoarthritis,    Abdominal   Peds  Hematology negative hematology ROS (+)   Anesthesia Other Findings   Reproductive/Obstetrics negative OB ROS                       Anesthesia Physical Anesthesia Plan  ASA: II  Anesthesia Plan: General   Post-op Pain Management:    Induction: Intravenous  Airway Management Planned: Oral ETT  Additional Equipment:   Intra-op Plan:   Post-operative Plan: Extubation in OR  Informed Consent: I have reviewed the patients History and Physical, chart, labs and discussed the procedure including the risks, benefits and alternatives for the proposed anesthesia with the patient or authorized representative who has indicated his/her understanding and acceptance.   Dental advisory given  Plan Discussed with: CRNA and Surgeon  Anesthesia Plan Comments:         Anesthesia Quick Evaluation

## 2012-07-08 NOTE — Op Note (Signed)
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 Midline incision was made above the spinous processes of L3-L4 and L5. Using Bovie cutting current the incision was carried on the spinous processes. Subperiosteal dissection was then carried out bilaterally on the spinous processes lamina facet joint and the far lateral region to identify the transverse processes. Self-retaining tract was placed for exposure and x-ray showed approach the appropriate levels. Using the Leksell rongeur spinous processes of L3-L4 and L5 were removed. Starting the patient's left side generous laminotomies were performed removing the inferior three quarters of the L3 lamina the medial three quarters of the facet joint the superior one third of the L4 lamina. Residual bone and ligamentum flavum removed in a piecemeal fashion. On the left side we then turned our attention to the L4-5 level where the inferior two thirds of the L4 lamina the medial three quarters of the facet joint the superior one third of the L5 lamina were removed. Residual bone and ligamentum flavum removed in a piecemeal fashion. Similar decompression was then carried on the opposite side and residual midline structures were removed to complete the bilateral decompression. L3-L4 and L5 nerve roots were visualized and decompressed the lateral recesses relieving the stenosis more so than needed for interbody fusion. We then prepared  the disc for interbody fusion. We clean them out thoroughly with pituitary rongeurs and curettes. We started L4-5 distracted the disc space up to 8 and 8 mm size and felt this was a good choice. We prepared the interspace for fusion and then packed a peek spacer a 9 x 22 mm size. We impacted the space without difficulty. We did the same day on the opposite side a prior to placing the second space we placed a mixture of autologous bone morselized allograft in the interspace to help with interbody fusion. We then packed and implanted the second spacer in excellent position confirmed by fluoroscopy. At L3-4 we distracted the disc space up to a 10 mm size and felt this was a good choice. Once again prepared the disc for interbody fusion and impacted cages with a mixture of morselized allograft and autologous bone. We packed the first spacer and placed the same mixture the balloon interspace and packed the second spacer and impacted without difficulty. Fossae showed the skin excellent position. We then placed pedicle screws in standard fashion. We used drill hole entry points then passed the pedicle all tapped with a 5.5 mm tap and placed 6.5 x 45 mm screws at L3 and L4 bilaterally and 6.5 x 40 mm screws at L5. These were followed in excellent position. We decorticated the far lateral region and then placed a mixture of autologous bone morselized allograft for posterior lateral fusion L3-L4 and L5. We then chose appropriate length rods and secured them to the top of the screws and then did tightening and final tightening of the top loading nuts. Decompression of L4 and L5 and L3 on L4. We then irrigated copiously metabolic irrigation controlled any bleeding with upper coagulation Gelfoam. The placed a cross-link difficulty tightened  in standard fashion. Left an epidural drain in the epidural space and brought out through a separate stab incision. We then closed the wound in multiple layers of Vicryl on the muscle fascia  subcutaneous and subcuticular tissues. Dermabond Steri-Strips were placed on the skin. Shortness was then applied the patient was extubated and taken to cut them in stable condition.

## 2012-07-08 NOTE — Anesthesia Procedure Notes (Signed)
Procedure Name: Intubation Date/Time: 07/08/2012 7:48 AM Performed by: Tyrone Nine Pre-anesthesia Checklist: Patient identified, Timeout performed, Emergency Drugs available, Patient being monitored and Suction available Patient Re-evaluated:Patient Re-evaluated prior to inductionOxygen Delivery Method: Circle system utilized Preoxygenation: Pre-oxygenation with 100% oxygen Intubation Type: IV induction Ventilation: Mask ventilation without difficulty Tube type: Oral Tube size: 7.5 mm Number of attempts: 1 Airway Equipment and Method: Stylet Placement Confirmation: ETT inserted through vocal cords under direct vision,  positive ETCO2 and breath sounds checked- equal and bilateral Secured at: 23 cm Tube secured with: Tape Dental Injury: Teeth and Oropharynx as per pre-operative assessment

## 2012-07-08 NOTE — Anesthesia Postprocedure Evaluation (Signed)
  Anesthesia Post-op Note  Patient: Holly Espinoza  Procedure(s) Performed: Procedure(s) with comments: POSTERIOR LUMBAR FUSION 2 LEVEL (N/A) - lumbar three-four,lumbar four five  Patient Location: PACU  Anesthesia Type:General  Level of Consciousness: awake and sedated  Airway and Oxygen Therapy: Patient Spontanous Breathing  Post-op Pain: mild  Post-op Assessment: Post-op Vital signs reviewed  Post-op Vital Signs: stable  Complications: No apparent anesthesia complications

## 2012-07-08 NOTE — H&P (Signed)
Holly Espinoza is an 52 y.o. female.   Chief Complaint: Back and bilateral leg pain HPI: The patient is a 52 year old female who presented with back and bilateral leg pain. She had remote surgery many years ago at L4-5 but not really got much improvement from that surgery. When evaluated in the office she was tried on aggressive conservative therapy which gave her no improvement. She'll imaging studies which showed marked stenosis at L3-4 and L4-5 with previous surgery at L4-5. There was also early listhesis at L4-5. After discussing the options the patient requested surgery now comes for a two-level posterior lumbar interbody fusion with pedicle screw fixation. I had a long discussion with her regarding the risks and benefits of surgical intervention. The risks discussed include but are not limited to bleeding infection weakness numbness paralysis spinal fluid leakage coma and death. We have discussed alternative methods of therapy offered risks and benefits of nonintervention. She's had the opportunity to ask numerous questions and appears to understand. With this information in hand she has requested that we proceed with surgery.  Past Medical History  Diagnosis Date  . Neuromuscular disorder   . Diabetes mellitus without complication   . Depression   . High cholesterol     Past Surgical History  Procedure Laterality Date  . Back surgery    . Neck surgery    . Abdominal hysterectomy    . Cholecystectomy      History reviewed. No pertinent family history. Social History:  reports that she has been smoking Cigarettes.  She has a 15 pack-year smoking history. She has never used smokeless tobacco. She reports that she does not drink alcohol or use illicit drugs.  Allergies: No Known Allergies  Medications Prior to Admission  Medication Sig Dispense Refill  . aspirin EC 81 MG tablet Take 81 mg by mouth daily.      Marland Kitchen atorvastatin (LIPITOR) 40 MG tablet Take 40 mg by mouth daily.      .  cyanocobalamin (,VITAMIN B-12,) 1000 MCG/ML injection Inject 1,000 mcg into the muscle every 30 (thirty) days. On the 1st of every month      . DULoxetine (CYMBALTA) 60 MG capsule Take 60 mg by mouth daily.      Marland Kitchen gabapentin (NEURONTIN) 300 MG capsule Take 300 mg by mouth 3 (three) times daily.      Marland Kitchen ibuprofen (ADVIL,MOTRIN) 800 MG tablet Take 800 mg by mouth 3 (three) times daily.      . metFORMIN (GLUCOPHAGE) 1000 MG tablet Take 1,000 mg by mouth 2 (two) times daily with a meal.      . naproxen sodium (ANAPROX) 220 MG tablet Take 440 mg by mouth 3 (three) times daily as needed (for pain).      . Vitamin D, Ergocalciferol, (DRISDOL) 50000 UNITS CAPS Take 50,000 Units by mouth every 30 (thirty) days. On 1st of the month      . oxyCODONE-acetaminophen (PERCOCET) 7.5-325 MG per tablet Take 1 tablet by mouth every 6 (six) hours as needed for pain.        No results found for this or any previous visit (from the past 48 hour(s)). No results found.  Review of systems not obtained due to patient factors.  Blood pressure 113/77, pulse 76, temperature 98.2 F (36.8 C), temperature source Oral, resp. rate 20, SpO2 98.00%.  The patient is awake alert and oriented. She is no facial asymmetry. Her gait is slow mildly antalgic. Reflexes are decreased but equal. She had mild  weakness of dorsiflexion. Sensation is intact. Assessment/Plan Impression is that of stenosis and listhesis at L3-4 and L4-5. The plan is for a two-level interbody fusion with instrumentation.  Reinaldo Meeker, MD 07/08/2012, 7:34 AM

## 2012-07-09 LAB — GLUCOSE, CAPILLARY
Glucose-Capillary: 133 mg/dL — ABNORMAL HIGH (ref 70–99)
Glucose-Capillary: 108 mg/dL — ABNORMAL HIGH (ref 70–99)
Glucose-Capillary: 93 mg/dL (ref 70–99)
Glucose-Capillary: 102 mg/dL — ABNORMAL HIGH (ref 70–99)

## 2012-07-09 MED ORDER — OXYCODONE HCL 5 MG PO TABS
15.0000 mg | ORAL_TABLET | ORAL | Status: DC | PRN
  Administered 2012-07-09 – 2012-07-12 (×20): 15 mg via ORAL
  Filled 2012-07-09 (×14): qty 3
  Filled 2012-07-09: qty 1
  Filled 2012-07-09 (×3): qty 3
  Filled 2012-07-09: qty 2
  Filled 2012-07-09 (×3): qty 3

## 2012-07-09 NOTE — Progress Notes (Signed)
Patient ID: Holly Espinoza, female   DOB: [birth year], 52 y.o.   MRN: [id number] Subjective: Patient reports incisional pain only  Objective: Vital signs in last 24 hours: Temp:  [97.3 F (36.3 C)-98.4 F (36.9 C)] 97.9 F (36.6 C) (04/30 0623) Pulse Rate:  [71-87] 74 (04/30 0623) Resp:  [13-24] 18 (04/30 0623) BP: (79-118)/(45-78) 101/55 mmHg (04/30 0623) SpO2:  [94 %-100 %] 96 % (04/30 0623) Weight:  [88.451 kg (195 lb)] 88.451 kg (195 lb) (04/29 1530)  Intake/Output from previous day: 04/29 0701 - 04/30 0700 In: 3715 [I.V.:3600; Blood:115] Out: 4125 [Urine:3375; Drains:350; Blood:400] Intake/Output this shift:    Wound:clean and dry   Lab Results: No results found for this basename: WBC, HGB, HCT, PLT,  in the last 72 hours BMET No results found for this basename: NA, K, CL, CO2, GLUCOSE, BUN, CREATININE, CALCIUM,  in the last 72 hours  Studies/Results: Dg Lumbar Spine 2-3 Views  07/08/2012  *RADIOLOGY REPORT*  Clinical Data: Back pain  DG C-ARM GT 120 MIN,LUMBAR SPINE - 2-3 VIEW  Technique:  C-arm fluoroscopic images were obtained intraoperatively and submitted for postoperative interpretation. Please see the performing provider's procedural report for the fluoroscopy time utilized.  Comparison: 06/05/2012  Findings: C-arm films document L3-L5 PLIF.  Satisfactory position and alignment.  IMPRESSION: As above.   Original Report Authenticated By: Davonna Belling, M.D.    Dg C-arm Gt 120 Min  07/08/2012  *RADIOLOGY REPORT*  Clinical Data: Back pain  DG C-ARM GT 120 MIN,LUMBAR SPINE - 2-3 VIEW  Technique:  C-arm fluoroscopic images were obtained intraoperatively and submitted for postoperative interpretation. Please see the performing provider's procedural report for the fluoroscopy time utilized.  Comparison: 06/05/2012  Findings: C-arm films document L3-L5 PLIF.  Satisfactory position and alignment.  IMPRESSION: As above.   Original Report Authenticated By: Davonna Belling, M.D.      Assessment/Plan: Doing well POD 1. Leg pain much better. Already ambulated. Will increase activity steadily. Plan d/c in 1 or 2 days.   LOS: 1 day  as above   Reinaldo Meeker, MD 07/09/2012, 8:44 AM

## 2012-07-09 NOTE — Evaluation (Signed)
Physical Therapy Evaluation Patient Details Name: Holly Espinoza MRN: [id number] DOB: 03/22/1960 Today's Date: 07/09/2012 Time: 7829-5621 PT Time Calculation (min): 33 min  PT Assessment / Plan / Recommendation Clinical Impression  Pt admitted s/p L3-4-5 posterolateral fusion.  Pt was agreeable to mobility, but anxious.  Complained of pain and fatigue throughout.  She presents with the below problem list impacting functional independence and would benefit from acute physical therapy to improve functional mobility to maximize safety for d/c to home.    PT Assessment  Patient needs continued PT services    Follow Up Recommendations  Home health PT;Supervision for mobility/OOB    Does the patient have the potential to tolerate intense rehabilitation      Barriers to Discharge None      Equipment Recommendations  Rolling walker with 5" wheels    Recommendations for Other Services     Frequency Min 5X/week    Precautions / Restrictions Precautions Precautions: Back Precaution Booklet Issued: Yes (comment) (back precaution handout) Required Braces or Orthoses: Spinal Brace Spinal Brace: Applied in sitting position;Lumbar corset Restrictions Weight Bearing Restrictions: No          Mobility  Bed Mobility Bed Mobility: Rolling Right;Sit to Supine;Sit to Sidelying Left Rolling Right: 4: Min guard Sit to Supine: 4: Min guard Sit to Sidelying Left: 4: Min assist Transfers Transfers: Sit to Stand;Stand to Sit Sit to Stand from chair: 4: Min assist providing verbal cues on back precautions and assisting to upright. Stand to Sit on bed: 3: Mod assist providing help to control descent Ambulation/Gait Ambulation/Gait Assistance: 4: Min guard providing verbal and tactile cues to relax. Assistive device: Rolling walker Ambulation/Gait Assistance Details: Pt needs verbal cues to relax shoulders when using rolling walker Gait Pattern: Decreased stride length;Step-through pattern,  decreased step height. Gait velocity: decreased General Gait Details: verbal/tactile cues for safe use of rolling walker. Stairs: No Wheelchair Mobility Wheelchair Mobility: No         PT Diagnosis: Acute pain;Difficulty walking  PT Problem List: Decreased activity tolerance;Decreased mobility;Impaired sensation;Decreased knowledge of use of DME;Pain PT Treatment Interventions: DME instruction;Functional mobility training;Gait training;Patient/family education   PT Goals Acute Rehab PT Goals PT Goal Formulation: With patient Time For Goal Achievement: 07/16/12 Potential to Achieve Goals: Good Pt will Roll Supine to Left Side: with supervision PT Goal: Rolling Supine to Left Side - Progress: Goal set today Pt will go Sit to Supine/Side: with supervision PT Goal: Sit to Supine/Side - Progress: Goal set today Pt will go Sit to Stand: with supervision PT Goal: Sit to Stand - Progress: Goal set today Pt will Ambulate: with rolling walker;>150 feet PT Goal: Ambulate - Progress: Goal set today Additional Goals Additional Goal #1: Pt will demonstrate comprehensive understanding of back precautions verbally and by demonstration during activity PT Goal: Additional Goal #1 - Progress: Goal set today  Visit Information  Assistance Needed: +1    Subjective Data  Subjective: Pt stated that she was independent in ADLs prior, but was unable to drive the car due to radicular symptoms.  Now is having radicular type symptoms in left lower extremity to the knee.  Right lower extremity feels "pretty good".  Complained that sitting up in the chair had increased her back pain. Patient Stated Goal: Pt wants to go home and be functionally independent   Prior Functioning  Home Living Lives With: Family Available Help at Discharge: Family Type of Home: House Home Access: Stairs to enter [employer] of Steps: 3 Home  Layout: One level Bathroom Toilet: Handicapped height Home Adaptive  Equipment: Straight cane Prior Function Level of Independence: Independent with assistive device(s) Able to Take Stairs?: Yes Driving: No Communication Communication: No difficulties    Cognition  Cognition Arousal/Alertness: Awake/alert Behavior During Therapy: Anxious Overall Cognitive Status: Within Functional Limits for tasks assessed    Extremity/Trunk Assessment Right Lower Extremity Assessment RLE ROM/Strength/Tone: [facility] for tasks assessed Left Lower Extremity Assessment LLE ROM/Strength/Tone: [facility] for tasks assessed   Balance Balance Balance Assessed: No  End of Session PT - End of Session Equipment Utilized During Treatment: Gait belt;Back brace Activity Tolerance: Patient limited by pain Patient left: in bed;with call bell/phone within reach  GP     Payton Doughty 07/09/2012, 11:16 AM

## 2012-07-09 NOTE — Evaluation (Signed)
I have read and agree with the below assessment and plan.   Holly Espinoza Holly Espinoza PT, DPT Pager: [phone] 

## 2012-07-09 NOTE — Progress Notes (Signed)
Foley cath d/c'd at 0700 and patient tolerated well. Ambulated down the hall and back to her room 120 ft with brace on.

## 2012-07-09 NOTE — Evaluation (Signed)
Occupational Therapy Evaluation Patient Details Name: Holly Espinoza MRN: [id number] DOB: 07/20/1960 Today's Date: 07/09/2012 Time: 1478-2956 OT Time Calculation (min): 30 min  OT Assessment / Plan / Recommendation Clinical Impression  Pt s/p posterior lumbar fusion thus affecting PLOF. Will benefit from continued OT services to address below problem list.    OT Assessment  Patient needs continued OT Services    Follow Up Recommendations  Home health OT    Barriers to Discharge      Equipment Recommendations  Tub/shower seat    Recommendations for Other Services    Frequency  Min 2X/week    Precautions / Restrictions Precautions Precautions: Back Precaution Booklet Issued: Yes (comment) Precaution Comments: reviewed 3/3 back precautions Required Braces or Orthoses: Spinal Brace Spinal Brace: Applied in sitting position;Lumbar corset   Pertinent Vitals/Pain See vitals    ADL  Grooming: Performed;Wash/dry hands;Supervision/safety Where Assessed - Grooming: Unsupported standing Upper Body Bathing: Simulated;Set up Where Assessed - Upper Body Bathing: Unsupported sitting Lower Body Bathing: Simulated;Minimal assistance Where Assessed - Lower Body Bathing: Supported sit to stand Upper Body Dressing: Set up;Performed Where Assessed - Upper Body Dressing: Unsupported sitting Lower Body Dressing: Simulated;Minimal assistance Where Assessed - Lower Body Dressing: Supported sit to stand Toilet Transfer: Performed;Minimal assistance Toilet Transfer Method: Sit to Barista: Comfort height toilet;Grab bars Toileting - Architect and Hygiene: Performed;Min guard Where Assessed - Engineer, mining and Hygiene: Sit to stand from 3-in-1 or toilet Equipment Used: Back brace;Gait belt Transfers/Ambulation Related to ADLs: Min guard ambulating with RW.  Attempted to ambulate without RW and using IV pole for steadying. Pt with several LOB  while pushing IV pole and required physical assist to regain balance. ADL Comments: Donned/doffed back brace correctly with setup assist. Pt somewhat anxious and often required VCs to attend to ADL task.    OT Diagnosis: Generalized weakness;Acute pain  OT Problem List: Decreased activity tolerance;Impaired balance (sitting and/or standing);Decreased knowledge of use of DME or AE;Decreased knowledge of precautions;Pain OT Treatment Interventions: Self-care/ADL training;DME and/or AE instruction;Therapeutic activities;Patient/family education;Balance training   OT Goals Acute Rehab OT Goals OT Goal Formulation: With patient Time For Goal Achievement: 07/16/12 Potential to Achieve Goals: Good ADL Goals Pt Will Perform Lower Body Bathing: with set-up;Sit to stand from chair;Sit to stand from bed;with adaptive equipment ADL Goal: Lower Body Bathing - Progress: Goal set today Pt Will Perform Lower Body Dressing: with set-up;Sit to stand from chair;Sit to stand from bed;with adaptive equipment ADL Goal: Lower Body Dressing - Progress: Goal set today Pt Will Transfer to Toilet: with supervision;Ambulation;with DME;Comfort height toilet;Maintaining back safety precautions ADL Goal: Toilet Transfer - Progress: Goal set today Pt Will Perform Toileting - Clothing Manipulation: with supervision;Standing ADL Goal: Toileting - Clothing Manipulation - Progress: Goal set today Pt Will Perform Toileting - Hygiene: with supervision;Standing at 3-in-1/toilet ADL Goal: Toileting - Hygiene - Progress: Goal set today Pt Will Perform Tub/Shower Transfer: Tub transfer;with min assist;Ambulation;with DME;Shower seat with back;Maintaining back safety precautions ADL Goal: Tub/Shower Transfer - Progress: Goal set today  Visit Information  Last OT Received On: 07/09/12 Assistance Needed: +1    Subjective Data      Prior Functioning     Home Living Lives With: Family Available Help at Discharge:  Family;Available 24 hours/day Type of Home: House Home Access: Stairs to enter [employer] of Steps: 3 Home Layout: One level Bathroom Shower/Tub: Engineer, manufacturing systems: Handicapped height Home Adaptive Equipment: Straight cane;Raised toilet seat with rails Prior  Function Level of Independence: Independent with assistive device(s) Able to Take Stairs?: Yes Driving: No Communication Communication: No difficulties         Vision/Perception     Cognition  Cognition Arousal/Alertness: Awake/alert Behavior During Therapy: Anxious Overall Cognitive Status: Within Functional Limits for tasks assessed    Extremity/Trunk Assessment Right Upper Extremity Assessment RUE ROM/Strength/Tone: [facility] for tasks assessed Left Upper Extremity Assessment LUE ROM/Strength/Tone: WFL for tasks assessed     Mobility Bed Mobility Bed Mobility: Rolling Left;Left Sidelying to Sit;Sitting - Scoot to Edge of Bed;Sit to Sidelying Left Rolling Left: 5: Supervision;With rail Left Sidelying to Sit: 4: Min guard;With rails Sitting - Scoot to Edge of Bed: 5: Supervision Sit to Sidelying Left: 4: Min guard;With rail Details for Bed Mobility Assistance: VCs for log roll technique Transfers Transfers: Sit to Stand;Stand to Sit Sit to Stand: 4: Min assist;From bed;With upper extremity assist;From toilet Stand to Sit: 4: Min assist;To toilet;To bed;With upper extremity assist Details for Transfer Assistance: VCs for safe hand placement and technique.     Exercise     Balance     End of Session OT - End of Session Equipment Utilized During Treatment: Back brace (RW) Activity Tolerance: Patient tolerated treatment well Patient left: in bed;with call bell/phone within reach;with bed alarm set Nurse Communication: Mobility status  GO   07/09/2012 Cipriano Mile OTR/L Pager [phone] Office [phone]   Cipriano Mile 07/09/2012, 6:11 PM

## 2012-07-10 LAB — GLUCOSE, CAPILLARY
Glucose-Capillary: 88 mg/dL (ref 70–99)
Glucose-Capillary: 90 mg/dL (ref 70–99)
Glucose-Capillary: 86 mg/dL (ref 70–99)

## 2012-07-10 NOTE — Progress Notes (Signed)
Occupational Therapy Treatment Patient Details Name: Atlantis Delong MRN: [id number] DOB: 1961/02/02 Today's Date: 07/10/2012 Time: 2536-6440 OT Time Calculation (min): 25 min  OT Assessment / Plan / Recommendation Comments on Treatment Session pt. with visible pain and grimacing and moaning with transitional movements but still willing to participate in skilled ot.    Follow Up Recommendations  Home health OT           Equipment Recommendations  Tub/shower seat        Frequency Min 2X/week   Plan      Precautions / Restrictions Precautions Precautions: Back Precaution Booklet Issued: Yes (comment) Precaution Comments: Reinforced precautions with pt and family Required Braces or Orthoses: Spinal Brace Spinal Brace: Lumbar corset;Applied in sitting position   Pertinent Vitals/Pain 8/10, repositioned    ADL  Lower Body Bathing:  (states she will have assistance at home) Where Assessed - Lower Body Bathing:  (states she will have assistance at home) Lower Body Dressing:  (declined a/e will have assistance at home) Toilet Transfer: Performed;Min guard Toilet Transfer Method: Sit to Barista: Regular height toilet;Grab bars Toileting - Clothing Manipulation and Hygiene: Performed;Supervision/safety Where Assessed - Engineer, mining and Hygiene: Sit on 3-in-1 or toilet Tub/Shower Transfer: Simulated;Moderate assistance Tub/Shower Transfer Method: Stand pivot Transfers/Ambulation Related to ADLs: amb. with use of iv pole, no lob noted ADL Comments: donned/doffed back brace correctly with set up.  sim. tub transfer with side-stepping to right side faucet.  pt. has shower doors.  discussed possiblility of removing doors and having curtain put up to allow for more room to side step in/out of tub and have a shower seat in tub prn.  emphasiszed need for sponge bathing until properly assessed., able to state 3/3 precautions         OT Goals ADL  Goals ADL Goal: Lower Body Bathing - Progress: Other (comment) (pt. sates she will have assistance at home for this task) ADL Goal: Lower Body Dressing - Progress: Other (comment) (pt. states she will have assistance with this task at home) ADL Goal: Toilet Transfer - Progress: Progressing toward goals ADL Goal: Toileting - Clothing Manipulation - Progress: Progressing toward goals ADL Goal: Toileting - Hygiene - Progress: Progressing toward goals ADL Goal: Tub/Shower Transfer - Progress: Progressing toward goals (rec. con't. practice with hhot once pt decides bathing plans)  Visit Information  Last OT Received On: 07/10/12    Subjective Data  Subjective: "i knew surgery would hurt, but i had no idea this much"          Cognition  Cognition Arousal/Alertness: Awake/alert Behavior During Therapy: WFL for tasks assessed/performed Overall Cognitive Status: Within Functional Limits for tasks assessed    Mobility  Bed Mobility Bed Mobility: Rolling Right;Rolling Left;Left Sidelying to Sit;Supine to Sit;Sit to Supine;Sit to Sidelying Left Rolling Right: 4: Min guard Rolling Left: 5: Supervision Left Sidelying to Sit: HOB flat;4: Min guard Supine to Sit: 5: Supervision;HOB flat Sitting - Scoot to Edge of Bed: 5: Supervision Sit to Supine: 4: Min assist Sit to Sidelying Left: HOB flat;4: Min assist Details for Bed Mobility Assistance: assistance to bring b les into bed while pt. maintaining back precautions Transfers Sit to Stand: 4: Min assist Stand to Sit: 4: Min assist Details for Transfer Assistance: verbal cues to maintain precautions.  Helped control descent with stand to sit.  Verbal cues to reach for the bed when sitting down.  End of Session OT - End of Session Equipment Utilized During Treatment: Back brace Activity Tolerance: Patient tolerated treatment well Patient left: in bed;with call bell/phone within reach       Robet Leu 07/10/2012, 3:01 PM

## 2012-07-10 NOTE — Progress Notes (Signed)
Patient ID: Holly Espinoza, female   DOB: 1960-09-23, 52 y.o.   MRN: [id number] Afeb, VSS. Complains of more incisional pain today. Neuro stable. Ambulating steadily more each day. Plan is for her to go home tomorrow. Will plan on that.

## 2012-07-10 NOTE — Progress Notes (Signed)
Hemovac accidentally pulled out by patient from turning in bed, bloody output from hemovac. Dressing intact on incision site. Will continue to monitor.

## 2012-07-10 NOTE — Plan of Care (Signed)
Problem: Phase III Progression Outcomes Goal: Demonstrates proper use of assistive devices Outcome: Progressing Still needing cues to relax shoulders and not rely on walker as much.

## 2012-07-10 NOTE — Progress Notes (Signed)
I have read and agree with the below treatment note and plan. Dona Klemann [name] PT, DPT Pager: [phone] 

## 2012-07-10 NOTE — Progress Notes (Signed)
Physical Therapy Treatment Patient Details Name: Holly Espinoza MRN: [id number] DOB: 04-28-1960 Today's Date: 07/10/2012 Time: 1324-4010 PT Time Calculation (min): 24 min  PT Assessment / Plan / Recommendation Comments on Treatment Session  Pt in obvious pain when entering the room.  Willing to attempt ambulation but limited severely by increased pain.   Spouse was in the room and further educated on back precautions and providing assistance.  Acute PT recommended to improve functional mobility to return to baseline.    Follow Up Recommendations  Home health PT;Supervision for mobility/OOB     Equipment Recommendations  Rolling walker with 5" wheels    Frequency Min 5X/week   Plan Discharge plan remains appropriate    Precautions / Restrictions Precautions Precautions: Back Precaution Booklet Issued: Yes (comment) Precaution Comments: Reinforced precautions with pt and family Required Braces or Orthoses: Spinal Brace Spinal Brace: Lumbar corset;Applied in sitting position   Pertinent Vitals/Pain Pain 10/10 reported at start of treatment.  Had just been administered pain medication.  Notified nursing and repositioned to decrease pain.    Mobility  Bed Mobility Bed Mobility: Sit to Supine Sit to Supine: 4: Min assist Details for Bed Mobility Assistance: tactile facilitation to assist legs into bed with verbal cues to maintain precautions. Transfers Transfers: Sit to Stand;Stand to Sit Sit to Stand: 4: Min assist Stand to Sit: 4: Min assist Details for Transfer Assistance: verbal cues to maintain precautions.  Helped control descent with stand to sit.  Verbal cues to reach for the bed when sitting down. Ambulation/Gait Ambulation/Gait Assistance: 4: Min guard Ambulation Distance (Feet): 60 Feet Assistive device: Rolling walker Ambulation/Gait Assistance Details: Tactile and verbal cues to relax shoulders while ambulating.  Verbal cues and demonstration on deep breathing to relax  through pain. Gait Pattern: Step-through pattern;Decreased stride length Gait velocity: Decreased General Gait Details: Shortened stridel length and step height.  Icreased dependence on walker today due to pain.      PT Goals Acute Rehab PT Goals PT Goal Formulation: With patient PT Goal: Rolling Supine to Left Side - Progress: Progressing toward goal PT Goal: Sit to Supine/Side - Progress: Progressing toward goal PT Goal: Sit to Stand - Progress: Progressing toward goal PT Goal: Ambulate - Progress: Progressing toward goal  Visit Information  Last PT Received On: 07/10/12 Assistance Needed: +1    Subjective Data  Subjective: Pt feeling intense pain at rest and with movement Patient Stated Goal: decrease pain   Cognition  Cognition Arousal/Alertness: Awake/alert Behavior During Therapy: Anxious Overall Cognitive Status: Within Functional Limits for tasks assessed    End of Session PT - End of Session Equipment Utilized During Treatment: Gait belt;Other (comment) (rolling walker) Activity Tolerance: Patient limited by pain Patient left: in bed;with call bell/phone within reach;with family/visitor present   GP     Payton Doughty 07/10/2012, 11:18 AM Payton Doughty, PT Student

## 2012-07-11 LAB — GLUCOSE, CAPILLARY
Glucose-Capillary: 77 mg/dL (ref 70–99)
Glucose-Capillary: 112 mg/dL — ABNORMAL HIGH (ref 70–99)
Glucose-Capillary: 117 mg/dL — ABNORMAL HIGH (ref 70–99)
Glucose-Capillary: 118 mg/dL — ABNORMAL HIGH (ref 70–99)
Glucose-Capillary: 107 mg/dL — ABNORMAL HIGH (ref 70–99)

## 2012-07-11 MED ORDER — POLYETHYLENE GLYCOL 3350 17 G PO PACK
17.0000 g | PACK | Freq: Every day | ORAL | Status: DC
  Administered 2012-07-11 – 2012-07-12 (×2): 17 g via ORAL
  Filled 2012-07-11 (×2): qty 1

## 2012-07-11 MED ORDER — SENNOSIDES-DOCUSATE SODIUM 8.6-50 MG PO TABS
1.0000 | ORAL_TABLET | Freq: Every day | ORAL | Status: DC
  Administered 2012-07-11: 1 via ORAL
  Filled 2012-07-11: qty 1

## 2012-07-11 MED FILL — Sodium Chloride IV Soln 0.9%: INTRAVENOUS | Qty: 1000 | Status: AC

## 2012-07-11 MED FILL — Heparin Sodium (Porcine) Inj 1000 Unit/ML: INTRAMUSCULAR | Qty: 30 | Status: AC

## 2012-07-11 NOTE — Progress Notes (Signed)
I have read and agree with the below treatment note and plan. Yoshiye Kraft [name] PT, DPT Pager: [phone] 

## 2012-07-11 NOTE — Progress Notes (Signed)
Patient ID: Holly Espinoza, female   DOB: [birth year], 52 y.o.   MRN: [id number] Afeb, vss. Feeling slowly better, but feels she would benefit from one more day. Wound looks excellent. No new neuro changes. Will plan d/c for tomorrow.

## 2012-07-11 NOTE — Progress Notes (Signed)
Physical Therapy Treatment Patient Details Name: Holly Espinoza MRN: [id number] DOB: 1960/03/14 Today's Date: 07/11/2012 Time: 8295-6213 PT Time Calculation (min): 20 min  PT Assessment / Plan / Recommendation Comments on Treatment Session  Pt in pain when entering but very willing to participate.  Ambulation limited secondary to pain in back and radiating to left LE.  Reinforcement of back precautions, and pt demonstrating understanding. Plan to reinforce precautions and care procedures with family at next visit.    Follow Up Recommendations  Home health PT;Supervision for mobility/OOB           Equipment Recommendations  Rolling walker with 5" wheels       Frequency Min 5X/week   Plan Discharge plan remains appropriate    Precautions / Restrictions Precautions Precautions: Back Precaution Comments: Pt able to demonstrate understanding of precautions Required Braces or Orthoses: Spinal Brace Spinal Brace: Lumbar corset;Applied in sitting position Restrictions Weight Bearing Restrictions: No   Pertinent Vitals/Pain 7/10 pain prior to activity.  Medication administered immediately prior to treatment.  Repositioned post treatment and notified nursing.    Mobility  Bed Mobility Bed Mobility: Supine to Sit Supine to Sit: 5: Supervision (v/c for encouragement and calming) Sit to Sidelying Left: 4: Min guard (v/c for precautions and calming.  Minimal t/c) Transfers Transfers: Sit to Stand;Stand to Sit Sit to Stand: 4: Min assist Stand to Sit: 4: Min assist (pt becomes very anxious when hands don't reach surface and needs v/c to keep reaching and control descent.  Provided facilitation to control descent. ) Details for Transfer Assistance: v/c for to reach for bed when sitting Ambulation/Gait Ambulation/Gait Assistance: 5: Supervision;4: Min guard (provided t/c and minor stabilization or of hips while ambulating and waiting for chair.) Ambulation Distance (Feet): 85 Feet Assistive  device: Rolling walker Ambulation/Gait Assistance Details: ambulation limited by fatigue and pain in LE Gait Pattern: Step-through pattern;Decreased stride length Gait velocity: decreased General Gait Details: shortened stride length and step height Stairs: No Wheelchair Mobility Wheelchair Mobility: No     PT Goals Acute Rehab PT Goals PT Goal: Rolling Supine to Left Side - Progress: Progressing toward goal PT Goal: Sit to Supine/Side - Progress: Progressing toward goal PT Goal: Sit to Stand - Progress: Progressing toward goal PT Goal: Ambulate - Progress: Progressing toward goal Additional Goals PT Goal: Additional Goal #1 - Progress: Met  Visit Information  Last PT Received On: 07/11/12 Assistance Needed: +1    Subjective Data  Subjective: Pt reporting pain at rest with increased pain into LE during movement. Patient Stated Goal: lower pain   Cognition  Cognition Arousal/Alertness: Awake/alert Behavior During Therapy: Anxious Overall Cognitive Status: Within Functional Limits for tasks assessed    Balance  Balance Balance Assessed: Yes Static Standing Balance Static Standing - Comment/# of Minutes: Standing drinking water with only left UE supported on walker  End of Session PT - End of Session Equipment Utilized During Treatment: Gait belt Activity Tolerance: Patient limited by pain Patient left: in bed;with call bell/phone within reach;with bed alarm set Nurse Communication: Mobility status;Patient requests pain meds   GP     Payton Doughty 07/11/2012, 2:39 PM Payton Doughty, PT Student

## 2012-07-12 LAB — GLUCOSE, CAPILLARY: Glucose-Capillary: 127 mg/dL — ABNORMAL HIGH (ref 70–99)

## 2012-07-12 MED ORDER — CYCLOBENZAPRINE HCL 10 MG PO TABS: 10.0000 mg | ORAL_TABLET | Freq: Three times a day (TID) | ORAL | Status: DC | PRN

## 2012-07-12 MED ORDER — OXYCODONE HCL 15 MG PO TABS: 15.0000 mg | ORAL_TABLET | ORAL | Status: DC | PRN

## 2012-07-12 NOTE — Progress Notes (Signed)
Agree with updated stair goal.  Jake Shark, PT DPT [phone]

## 2012-07-12 NOTE — Progress Notes (Addendum)
Physical Therapy Treatment Patient Details Name: Holly Espinoza MRN: [id number] DOB: March 03, 1961 Today's Date: 07/12/2012 Time: 1478-2956 PT Time Calculation (min): 36 min  PT Assessment / Plan / Recommendation Comments on Treatment Session  Pt cont's to c/o significant pain (8/10).  overall moving fairly well despite pain but very guarded/stiff & requires cues to reinforce "no twisting" with functional mobility.   Performed stairs today due to pt reports she has 3 steps to enter house (stair goal added)    Follow Up Recommendations  Home health PT;Supervision for mobility/OOB     Does the patient have the potential to tolerate intense rehabilitation     Barriers to Discharge        Equipment Recommendations  Rolling walker with 5" wheels    Recommendations for Other Services    Frequency Min 5X/week   Plan Discharge plan remains appropriate    Precautions / Restrictions Precautions Precautions: Back Precaution Comments: Pt verbalizes 3/3 back precautions but requires frequent cueing to reinforce "no twisting" with functional mobility Required Braces or Orthoses: Spinal Brace Spinal Brace: Lumbar corset;Applied in sitting position Restrictions Weight Bearing Restrictions: No   Pertinent Vitals/Pain 8/10 back & Lt LE.  Premedicated.      Mobility  Bed Mobility Bed Mobility: Left Sidelying to Sit;Sitting - Scoot to Edge of Bed;Sit to Sidelying Left Left Sidelying to Sit: 5: Supervision;HOB flat Sit to Sidelying Left: 5: Supervision;HOB flat Details for Bed Mobility Assistance: Effortful & required increased time but able to complete without physical (A).  Transfers Transfers: Sit to Stand;Stand to Sit Sit to Stand: 5: Supervision;With upper extremity assist;From bed Stand to Sit: 5: Supervision;With upper extremity assist;To bed Details for Transfer Assistance: Cues for reinforce hand placement, No twisting when reaching back for seated surface to  sit Ambulation/Gait Ambulation/Gait Assistance: 5: Supervision Ambulation Distance (Feet): 150 Feet Assistive device: Rolling walker Ambulation/Gait Assistance Details: cues to relax.  Pt with very stiff, guarded gait 2/2 pain per pt.   Gait Pattern: Step-through pattern;Decreased stride length Gait velocity: decreased General Gait Details: shortened stride length and step height Stairs: Yes Stairs Assistance: 4: Min assist Stairs Assistance Details (indicate cue type and reason): (A) provided via HHA as pt ascended/descended.   Pt held onto Lt rail to mimic iron rod pole she can hold onto at home.  She reports husband has been assisting her up/down steps for a while now.   Stair Management Technique: One rail Left;Step to pattern;Forwards Number of Stairs: 3 Wheelchair Mobility Wheelchair Mobility: No     PT Goals Acute Rehab PT Goals Time For Goal Achievement: 07/16/12 Potential to Achieve Goals: Good Pt will Roll Supine to Left Side: with supervision Pt will go Sit to Supine/Side: with supervision PT Goal: Sit to Supine/Side - Progress: Progressing toward goal Pt will go Sit to Stand: with supervision PT Goal: Sit to Stand - Progress: Met Pt will Ambulate: with rolling walker;>150 feet PT Goal: Ambulate - Progress: Progressing toward goal Pt will Go Up / Down Stairs: 3-5 stairs;with supervision;with rail(s) (1 rail to mimic pole on Lt) PT Goal: Up/Down Stairs - Progress: Goal set today Additional Goals Additional Goal #1: Pt will demonstrate comprehensive understanding of back precautions verbally and by demonstration during activity PT Goal: Additional Goal #1 - Progress: Progressing toward goal  Visit Information  Last PT Received On: 07/12/12 Assistance Needed: +1 PT/OT Co-Evaluation/Treatment: Yes    Subjective Data  Subjective: Pt reporting pain at rest with increased pain into LE during movement. Patient Stated  Goal: lower pain   Cognition   Cognition Arousal/Alertness: Awake/alert Behavior During Therapy: WFL for tasks assessed/performed Overall Cognitive Status: Within Functional Limits for tasks assessed    Balance     End of Session PT - End of Session Equipment Utilized During Treatment: Gait belt;Back brace Activity Tolerance: Patient limited by pain Patient left: in bed;with call bell/phone within reach Nurse Communication: Mobility status     Verdell Face, [place] [phone] 07/12/2012

## 2012-07-12 NOTE — Discharge Summary (Signed)
Physician Discharge Summary  Patient ID: Holly Espinoza MRN: [id number] DOB/AGE: [birth year] 51 y.o.  Admit date: 07/08/2012 Discharge date: 07/12/2012  Admission Diagnoses:  Discharge Diagnoses:  Active Problems:   * No active hospital problems. *   Discharged Condition: good  Hospital Course: Surgery Tuesday for 2 level plif. Did well. Slowly uincreased activity. Buy pod 4, ambulating well, pain controlled, wound healing well. Sent home with specific instructions given.  Consults: None  Significant Diagnostic Studies: none  Treatments: surgery: L 34 L 45 plif with screws  Discharge Exam: Blood pressure 91/43, pulse 91, temperature 98.6 F (37 C), temperature source Oral, resp. rate 20, height 5\' 8"  (1.727 m), weight 88.451 kg (195 lb), SpO2 95.00%. Incision/Wound:healing well  Disposition: 01-Home or Self Care  Discharge Orders   Future Orders Complete By Expires     Call MD for:  difficulty breathing, headache or visual disturbances  As directed     Call MD for:  hives  As directed     Call MD for:  persistant nausea and vomiting  As directed     Call MD for:  redness, tenderness, or signs of infection (pain, swelling, redness, odor or green/yellow discharge around incision site)  As directed     Call MD for:  severe uncontrolled pain  As directed     Call MD for:  temperature >100.4  As directed     Diet general  As directed     Discharge instructions  As directed     Comments:      Mostly bedrest. Get up 9 or 10 times each day and walk for 15-20 minutes each time. Very little sitting the first week. No riding in the car until your first post op appointment. If you had neck surgery...may shower from the chest down. If you had low back surgery....you may shower with a saran wrap covering over the incision. Take your pain medicine as needed...and other medicines that you are instructed to take. Call for an appointment...[phone].        Medication List    STOP taking  these medications       aspirin EC 81 MG tablet     ibuprofen 800 MG tablet  Commonly known as:  ADVIL,MOTRIN     naproxen sodium 220 MG tablet  Commonly known as:  ANAPROX     oxyCODONE-acetaminophen 7.5-325 MG per tablet  Commonly known as:  PERCOCET      TAKE these medications       atorvastatin 40 MG tablet  Commonly known as:  LIPITOR  Take 40 mg by mouth daily.     cyanocobalamin 1000 MCG/ML injection  Commonly known as:  (VITAMIN B-12)  Inject 1,000 mcg into the muscle every 30 (thirty) days. On the 1st of every month     cyclobenzaprine 10 MG tablet  Commonly known as:  FLEXERIL  Take 1 tablet (10 mg total) by mouth 3 (three) times daily as needed for muscle spasms.     DULoxetine 60 MG capsule  Commonly known as:  CYMBALTA  Take 60 mg by mouth daily.     gabapentin 300 MG capsule  Commonly known as:  NEURONTIN  Take 300 mg by mouth 3 (three) times daily.     metFORMIN 1000 MG tablet  Commonly known as:  GLUCOPHAGE  Take 1,000 mg by mouth 2 (two) times daily with a meal.     oxyCODONE 15 MG immediate release tablet  Commonly known as:  ROXICODONE  Take 1 tablet (15 mg total) by mouth every 4 (four) hours as needed for pain.     Vitamin D (Ergocalciferol) 50000 UNITS Caps  Commonly known as:  DRISDOL  Take 50,000 Units by mouth every 30 (thirty) days. On 1st of the month         At home rest most of the time. Get up 9 or 10 times each day and take a 15 or 20 minute walk. No riding in the car and to your first postoperative appointment. If you have neck surgery you may shower from the chest down starting on the third postoperative day. If you had back surgery he may start showering on the third postoperative day with saran wrap wrapped around your incisional area 3 times. After the shower remove the saran wrap. Take pain medicine as needed and other medications as instructed. Call my office for an appointment.  SignedReinaldo Meeker, MD 07/12/2012, 10:42  AM

## 2012-07-12 NOTE — Progress Notes (Signed)
Occupational Therapy Treatment Patient Details Name: Liesl Simons MRN: [id number] DOB: 09/08/1960 Today's Date: 07/12/2012 Time: 0865-7846 OT Time Calculation (min): 26 min  OT Assessment / Plan / Recommendation Comments on Treatment Session visible pain but motivated with participation    Follow Up Recommendations  Home health OT           Equipment Recommendations  Tub/shower seat        Frequency Min 2X/week         Precautions / Restrictions Precautions Precautions: Back Precaution Comments: able to recall 3/3 but requires max cues for "no twisting" with functional mobility Required Braces or Orthoses: Spinal Brace Spinal Brace: Lumbar corset;Applied in sitting position Restrictions Weight Bearing Restrictions: No   Pertinent Vitals/Pain Increased pain with positional changes, states meds given prior to tx.    ADL  Tub/Shower Transfer: Performed;Min guard Tub/Shower Transfer Method: Other (comment) (side step over tub for right faucet) Tub/Shower Transfer Equipment: Grab bars;Shower seat with back Transfers/Ambulation Related to ADLs: amb. with r.w. no lob noted ADL Comments: able to side step into tub with right faucet with ue support on wall.  agrees she will need the space to bring les over side of tub and doors will not allow her the amount of space needed.  will discuss with husband.           OT Goals ADL Goals ADL Goal: Tub/Shower Transfer - Progress: Progressing toward goals  Visit Information  Last OT Received On: 07/12/12 Assistance Needed: +1    Subjective Data  Subjective: "yeah, i can go walk with yall" Patient Stated Goal: home   Prior Functioning       Cognition  Cognition Arousal/Alertness: Awake/alert Behavior During Therapy: WFL for tasks assessed/performed Overall Cognitive Status: Within Functional Limits for tasks assessed    Mobility  Bed Mobility Bed Mobility: Left Sidelying to Sit;Sitting - Scoot to [place] of Bed;Sit to Sidelying  Left Rolling Right: 4: Min guard Rolling Left: 5: Supervision Left Sidelying to Sit: 5: Supervision;HOB flat Supine to Sit: 5: Supervision Sitting - Scoot to Edge of Bed: 5: Supervision Sit to Supine: 4: Min guard Sit to Sidelying Left: 5: Supervision;HOB flat Details for Bed Mobility Assistance: able to complete but requires cues for maintaining back precautions, reports she lays on side and did not want to practice rolling on back Transfers Transfers: Sit to Stand;Stand to Sit Sit to Stand: 5: Supervision;With upper extremity assist;From bed Stand to Sit: 5: Supervision;With upper extremity assist;To bed Details for Transfer Assistance: cues for maintaining back precautions               End of Session OT - End of Session Equipment Utilized During Treatment: Back brace Activity Tolerance: Patient tolerated treatment well Patient left: in bed;with call bell/phone within reach       Robet Leu 07/12/2012, 11:12 AM

## 2012-07-12 NOTE — Progress Notes (Signed)
Gave all health education and instructions. Gave the prescription to the patient. D/c IV. Rolled the patient down in a wheel chair. No concerns.   Fuller Canada, RN

## 2012-07-20 ENCOUNTER — Emergency Department: Payer: Self-pay | Admitting: Emergency Medicine

## 2012-07-20 LAB — BASIC METABOLIC PANEL
Anion Gap: 2 — ABNORMAL LOW (ref 7–16)
BUN: 10 mg/dL (ref 7–18)
Calcium, Total: 9.1 mg/dL (ref 8.5–10.1)
Chloride: 104 mmol/L (ref 98–107)
Co2: 30 mmol/L (ref 21–32)
Creatinine: 0.73 mg/dL (ref 0.60–1.30)
EGFR (African American): >60
EGFR (Non-African Amer.): >60
Glucose: 108 mg/dL — ABNORMAL HIGH (ref 65–99)
Osmolality: 272 (ref 275–301)
Potassium: 3.9 mmol/L (ref 3.5–5.1)
Sodium: 136 mmol/L (ref 136–145)

## 2012-07-20 LAB — CBC WITH DIFFERENTIAL/PLATELET
Basophil #: 0.1 10*3/uL (ref 0.0–0.1)
Basophil %: 0.6 %
Eosinophil #: 0.2 10*3/uL (ref 0.0–0.7)
Eosinophil %: 1.3 %
HCT: 26.2 % — ABNORMAL LOW (ref 35.0–47.0)
HGB: 8.6 g/dL — ABNORMAL LOW (ref 12.0–16.0)
Lymphocyte #: 1.3 10*3/uL (ref 1.0–3.6)
Lymphocyte %: 11.2 %
MCH: 28.8 pg (ref 26.0–34.0)
MCHC: 33.1 g/dL (ref 32.0–36.0)
MCV: 87 fL (ref 80–100)
Monocyte #: 0.6 x10 3/mm (ref 0.2–0.9)
Monocyte %: 5.1 %
Neutrophil #: 9.8 10*3/uL — ABNORMAL HIGH (ref 1.4–6.5)
Neutrophil %: 81.8 %
Platelet: 401 10*3/uL (ref 150–440)
RBC: 3 10*6/uL — ABNORMAL LOW (ref 3.80–5.20)
RDW: 13.3 % (ref 11.5–14.5)
WBC: 11.9 10*3/uL — ABNORMAL HIGH (ref 3.6–11.0)

## 2012-07-20 LAB — SEDIMENTATION RATE: Erythrocyte Sed Rate: >140 mm/hr — ABNORMAL HIGH (ref 0–30)

## 2012-08-11 ENCOUNTER — Encounter (HOSPITAL_COMMUNITY): Payer: Self-pay | Admitting: *Deleted

## 2012-08-11 ENCOUNTER — Other Ambulatory Visit: Payer: Self-pay | Admitting: Neurosurgery

## 2012-08-12 ENCOUNTER — Ambulatory Visit (HOSPITAL_COMMUNITY): Payer: Medicaid Other | Admitting: Anesthesiology

## 2012-08-12 ENCOUNTER — Inpatient Hospital Stay (HOSPITAL_COMMUNITY)
Admission: RE | Admit: 2012-08-12 | Discharge: 2012-08-15 | DRG: 856 | Disposition: A | Discharge status: Home / Self Care | Payer: Medicaid Other | Source: Ambulatory Visit | Attending: Neurosurgery | Admitting: Neurosurgery

## 2012-08-12 ENCOUNTER — Encounter (HOSPITAL_COMMUNITY): Payer: Self-pay | Admitting: Anesthesiology

## 2012-08-12 ENCOUNTER — Encounter (HOSPITAL_COMMUNITY)
Admission: RE | Disposition: A | Discharge status: Home / Self Care | Payer: Self-pay | Source: Ambulatory Visit | Attending: Neurosurgery

## 2012-08-12 DIAGNOSIS — G709 Myoneural disorder, unspecified: Secondary | ICD-10-CM | POA: Diagnosis present

## 2012-08-12 DIAGNOSIS — T8140XA Infection following a procedure, unspecified, initial encounter: Principal | ICD-10-CM | POA: Diagnosis present

## 2012-08-12 DIAGNOSIS — Z6825 Body mass index (BMI) 25.0-25.9, adult: Secondary | ICD-10-CM

## 2012-08-12 DIAGNOSIS — M545 Low back pain, unspecified: Secondary | ICD-10-CM | POA: Diagnosis present

## 2012-08-12 DIAGNOSIS — M129 Arthropathy, unspecified: Secondary | ICD-10-CM | POA: Diagnosis present

## 2012-08-12 DIAGNOSIS — E1149 Type 2 diabetes mellitus with other diabetic neurological complication: Secondary | ICD-10-CM | POA: Diagnosis present

## 2012-08-12 DIAGNOSIS — F329 Major depressive disorder, single episode, unspecified: Secondary | ICD-10-CM | POA: Diagnosis present

## 2012-08-12 DIAGNOSIS — E1142 Type 2 diabetes mellitus with diabetic polyneuropathy: Secondary | ICD-10-CM | POA: Diagnosis present

## 2012-08-12 DIAGNOSIS — F17200 Nicotine dependence, unspecified, uncomplicated: Secondary | ICD-10-CM | POA: Diagnosis present

## 2012-08-12 DIAGNOSIS — E7800 Pure hypercholesterolemia, unspecified: Secondary | ICD-10-CM | POA: Diagnosis present

## 2012-08-12 DIAGNOSIS — D518 Other vitamin B12 deficiency anemias: Secondary | ICD-10-CM | POA: Diagnosis present

## 2012-08-12 DIAGNOSIS — Z794 Long term (current) use of insulin: Secondary | ICD-10-CM

## 2012-08-12 DIAGNOSIS — E43 Unspecified severe protein-calorie malnutrition: Secondary | ICD-10-CM | POA: Diagnosis present

## 2012-08-12 DIAGNOSIS — A4901 Methicillin susceptible Staphylococcus aureus infection, unspecified site: Secondary | ICD-10-CM | POA: Diagnosis present

## 2012-08-12 DIAGNOSIS — F3289 Other specified depressive episodes: Secondary | ICD-10-CM | POA: Diagnosis present

## 2012-08-12 DIAGNOSIS — Z981 Arthrodesis status: Secondary | ICD-10-CM

## 2012-08-12 DIAGNOSIS — Y838 Other surgical procedures as the cause of abnormal reaction of the patient, or of later complication, without mention of misadventure at the time of the procedure: Secondary | ICD-10-CM | POA: Diagnosis present

## 2012-08-12 DIAGNOSIS — G8929 Other chronic pain: Secondary | ICD-10-CM | POA: Diagnosis present

## 2012-08-12 HISTORY — PX: LUMBAR WOUND DEBRIDEMENT: SHX1988

## 2012-08-12 LAB — BASIC METABOLIC PANEL
BUN: 10 mg/dL (ref 6–23)
CO2: 26 mEq/L (ref 19–32)
Calcium: 9.3 mg/dL (ref 8.4–10.5)
Chloride: 97 mEq/L (ref 96–112)
Creatinine, Ser: 0.66 mg/dL (ref 0.50–1.10)
GFR calc Af Amer: >90 mL/min (ref >90)
GFR calc non Af Amer: >90 mL/min (ref >90)
Glucose, Bld: 128 mg/dL — ABNORMAL HIGH (ref 70–99)
Potassium: 4 mEq/L (ref 3.5–5.1)
Sodium: 133 mEq/L — ABNORMAL LOW (ref 135–145)

## 2012-08-12 LAB — GRAM STAIN

## 2012-08-12 LAB — CBC
HCT: 27.5 % — ABNORMAL LOW (ref 36.0–46.0)
Hemoglobin: 9 g/dL — ABNORMAL LOW (ref 12.0–15.0)
MCH: 27.8 pg (ref 26.0–34.0)
MCHC: 32.7 g/dL (ref 30.0–36.0)
MCV: 84.9 fL (ref 78.0–100.0)
Platelets: 295 10*3/uL (ref 150–400)
RBC: 3.24 MIL/uL — ABNORMAL LOW (ref 3.87–5.11)
RDW: 14.2 % (ref 11.5–15.5)
WBC: 7.8 10*3/uL (ref 4.0–10.5)

## 2012-08-12 LAB — GLUCOSE, CAPILLARY
Glucose-Capillary: 123 mg/dL — ABNORMAL HIGH (ref 70–99)
Glucose-Capillary: 97 mg/dL (ref 70–99)
Glucose-Capillary: 96 mg/dL (ref 70–99)

## 2012-08-12 LAB — SURGICAL PCR SCREEN
MRSA, PCR: NEGATIVE
Staphylococcus aureus: NEGATIVE

## 2012-08-12 SURGERY — LUMBAR WOUND DEBRIDEMENT
Anesthesia: General | Wound class: Dirty or Infected

## 2012-08-12 MED ORDER — HEMOSTATIC AGENTS (NO CHARGE) OPTIME
TOPICAL | Status: DC | PRN
  Administered 2012-08-12: 1 via TOPICAL

## 2012-08-12 MED ORDER — PANTOPRAZOLE SODIUM 40 MG IV SOLR
40.0000 mg | Freq: Every day | INTRAVENOUS | Status: DC
  Administered 2012-08-12 – 2012-08-14 (×3): 40 mg via INTRAVENOUS
  Filled 2012-08-12 (×4): qty 40

## 2012-08-12 MED ORDER — ONDANSETRON HCL 4 MG/2ML IJ SOLN: 4.0000 mg | Freq: Once | INTRAMUSCULAR | Status: DC | PRN

## 2012-08-12 MED ORDER — LIDOCAINE HCL (CARDIAC) 20 MG/ML IV SOLN
INTRAVENOUS | Status: DC | PRN
  Administered 2012-08-12: 70 mg via INTRAVENOUS

## 2012-08-12 MED ORDER — ACETAMINOPHEN 325 MG PO TABS: 650.0000 mg | ORAL_TABLET | ORAL | Status: DC | PRN

## 2012-08-12 MED ORDER — THROMBIN 5000 UNITS EX SOLR
CUTANEOUS | Status: DC | PRN
  Administered 2012-08-12 (×2): 5000 [IU] via TOPICAL

## 2012-08-12 MED ORDER — VANCOMYCIN HCL IN DEXTROSE 1-5 GM/200ML-% IV SOLN
1000.0000 mg | Freq: Two times a day (BID) | INTRAVENOUS | Status: DC
  Administered 2012-08-12 – 2012-08-14 (×5): 1000 mg via INTRAVENOUS
  Filled 2012-08-12 (×6): qty 200

## 2012-08-12 MED ORDER — DULOXETINE HCL 60 MG PO CPEP
60.0000 mg | ORAL_CAPSULE | Freq: Every day | ORAL | Status: DC
  Administered 2012-08-13 – 2012-08-15 (×3): 60 mg via ORAL
  Filled 2012-08-12 (×3): qty 1

## 2012-08-12 MED ORDER — MUPIROCIN 2 % EX OINT: TOPICAL_OINTMENT | Freq: Two times a day (BID) | CUTANEOUS | Status: DC

## 2012-08-12 MED ORDER — LIDOCAINE HCL 4 % MT SOLN
OROMUCOSAL | Status: DC | PRN
  Administered 2012-08-12: 4 mL via TOPICAL

## 2012-08-12 MED ORDER — ONDANSETRON HCL 4 MG/2ML IJ SOLN
INTRAMUSCULAR | Status: DC | PRN
  Administered 2012-08-12: 4 mg via INTRAVENOUS

## 2012-08-12 MED ORDER — HYDROMORPHONE HCL PF 1 MG/ML IJ SOLN
0.5000 mg | INTRAMUSCULAR | Status: AC | PRN
  Administered 2012-08-12: 0.5 mg via INTRAVENOUS

## 2012-08-12 MED ORDER — SODIUM CHLORIDE 0.9 % IV SOLN: 250.0000 mL | INTRAVENOUS | Status: DC

## 2012-08-12 MED ORDER — DEXAMETHASONE SODIUM PHOSPHATE 10 MG/ML IJ SOLN: 10.0000 mg | INTRAMUSCULAR | Status: DC

## 2012-08-12 MED ORDER — LACTATED RINGERS IV SOLN
INTRAVENOUS | Status: DC | PRN
  Administered 2012-08-12 (×2): via INTRAVENOUS

## 2012-08-12 MED ORDER — ATORVASTATIN CALCIUM 40 MG PO TABS
40.0000 mg | ORAL_TABLET | Freq: Every day | ORAL | Status: DC
  Administered 2012-08-12 – 2012-08-14 (×3): 40 mg via ORAL
  Filled 2012-08-12 (×4): qty 1

## 2012-08-12 MED ORDER — HYDROMORPHONE HCL PF 1 MG/ML IJ SOLN
0.2500 mg | INTRAMUSCULAR | Status: DC | PRN
  Administered 2012-08-12 (×2): 0.5 mg via INTRAVENOUS

## 2012-08-12 MED ORDER — GABAPENTIN 300 MG PO CAPS
300.0000 mg | ORAL_CAPSULE | Freq: Three times a day (TID) | ORAL | Status: DC
  Administered 2012-08-12 – 2012-08-15 (×8): 300 mg via ORAL
  Filled 2012-08-12 (×10): qty 1

## 2012-08-12 MED ORDER — SUCCINYLCHOLINE CHLORIDE 20 MG/ML IJ SOLN
INTRAMUSCULAR | Status: DC | PRN
  Administered 2012-08-12: 100 mg via INTRAVENOUS

## 2012-08-12 MED ORDER — METFORMIN HCL 500 MG PO TABS
1000.0000 mg | ORAL_TABLET | Freq: Two times a day (BID) | ORAL | Status: DC
  Administered 2012-08-13 – 2012-08-15 (×5): 1000 mg via ORAL
  Filled 2012-08-12 (×8): qty 2

## 2012-08-12 MED ORDER — OXYCODONE-ACETAMINOPHEN 5-325 MG PO TABS
1.0000 | ORAL_TABLET | ORAL | Status: DC | PRN
  Administered 2012-08-13 – 2012-08-15 (×13): 2 via ORAL
  Filled 2012-08-12 (×13): qty 2

## 2012-08-12 MED ORDER — ARTIFICIAL TEARS OP OINT
TOPICAL_OINTMENT | OPHTHALMIC | Status: DC | PRN
  Administered 2012-08-12: 1 via OPHTHALMIC

## 2012-08-12 MED ORDER — HYDROMORPHONE HCL PF 1 MG/ML IJ SOLN
INTRAMUSCULAR | Status: AC
  Administered 2012-08-12: 0.5 mg via INTRAVENOUS
  Filled 2012-08-12: qty 1

## 2012-08-12 MED ORDER — INSULIN ASPART 100 UNIT/ML ~~LOC~~ SOLN: 0.0000 [IU] | Freq: Three times a day (TID) | SUBCUTANEOUS | Status: DC

## 2012-08-12 MED ORDER — MUPIROCIN 2 % EX OINT
TOPICAL_OINTMENT | CUTANEOUS | Status: AC
  Filled 2012-08-12: qty 22

## 2012-08-12 MED ORDER — OXYCODONE-ACETAMINOPHEN 5-325 MG PO TABS
ORAL_TABLET | ORAL | Status: AC
  Administered 2012-08-12: 2 via ORAL
  Filled 2012-08-12: qty 2

## 2012-08-12 MED ORDER — BACITRACIN 50000 UNITS IM SOLR
INTRAMUSCULAR | Status: AC
  Filled 2012-08-12: qty 1

## 2012-08-12 MED ORDER — POTASSIUM CHLORIDE IN NACL 20-0.45 MEQ/L-% IV SOLN
INTRAVENOUS | Status: DC
  Administered 2012-08-12: 23:00:00 via INTRAVENOUS
  Filled 2012-08-12 (×7): qty 1000

## 2012-08-12 MED ORDER — INSULIN ASPART 100 UNIT/ML ~~LOC~~ SOLN: 0.0000 [IU] | Freq: Every day | SUBCUTANEOUS | Status: DC

## 2012-08-12 MED ORDER — FENTANYL CITRATE 0.05 MG/ML IJ SOLN
INTRAMUSCULAR | Status: DC | PRN
  Administered 2012-08-12: 50 ug via INTRAVENOUS
  Administered 2012-08-12: 100 ug via INTRAVENOUS
  Administered 2012-08-12 (×3): 50 ug via INTRAVENOUS
  Administered 2012-08-12: 100 ug via INTRAVENOUS
  Administered 2012-08-12 (×2): 50 ug via INTRAVENOUS

## 2012-08-12 MED ORDER — CEFAZOLIN SODIUM-DEXTROSE 2-3 GM-% IV SOLR
2.0000 g | INTRAVENOUS | Status: AC
  Administered 2012-08-12: 2 g via INTRAVENOUS

## 2012-08-12 MED ORDER — CYCLOBENZAPRINE HCL 10 MG PO TABS
ORAL_TABLET | ORAL | Status: AC
  Administered 2012-08-12: 10 mg via ORAL
  Filled 2012-08-12: qty 1

## 2012-08-12 MED ORDER — ACETAMINOPHEN 650 MG RE SUPP: 650.0000 mg | RECTAL | Status: DC | PRN

## 2012-08-12 MED ORDER — SODIUM CHLORIDE 0.9 % IJ SOLN: 3.0000 mL | INTRAMUSCULAR | Status: DC | PRN

## 2012-08-12 MED ORDER — CYCLOBENZAPRINE HCL 10 MG PO TABS
10.0000 mg | ORAL_TABLET | Freq: Three times a day (TID) | ORAL | Status: DC | PRN
  Administered 2012-08-13 – 2012-08-15 (×5): 10 mg via ORAL
  Filled 2012-08-12 (×5): qty 1

## 2012-08-12 MED ORDER — SODIUM CHLORIDE 0.9 % IJ SOLN
3.0000 mL | Freq: Two times a day (BID) | INTRAMUSCULAR | Status: DC
  Administered 2012-08-13 – 2012-08-14 (×3): 3 mL via INTRAVENOUS

## 2012-08-12 MED ORDER — ZOLPIDEM TARTRATE 5 MG PO TABS
5.0000 mg | ORAL_TABLET | Freq: Every evening | ORAL | Status: DC | PRN
  Administered 2012-08-14 (×2): 5 mg via ORAL
  Filled 2012-08-12 (×2): qty 1

## 2012-08-12 MED ORDER — SODIUM CHLORIDE 0.9 % IR SOLN
Status: DC | PRN
  Administered 2012-08-12 (×3): 1000 mL

## 2012-08-12 MED ORDER — MENTHOL 3 MG MT LOZG: 1.0000 | LOZENGE | OROMUCOSAL | Status: DC | PRN

## 2012-08-12 MED ORDER — PROPOFOL 10 MG/ML IV BOLUS
INTRAVENOUS | Status: DC | PRN
  Administered 2012-08-12: 200 mg via INTRAVENOUS
  Administered 2012-08-12: 30 mg via INTRAVENOUS

## 2012-08-12 MED ORDER — SODIUM CHLORIDE 0.9 % IR SOLN
Status: DC | PRN
  Administered 2012-08-12: 19:00:00

## 2012-08-12 MED ORDER — ONDANSETRON HCL 4 MG/2ML IJ SOLN: 4.0000 mg | INTRAMUSCULAR | Status: DC | PRN

## 2012-08-12 MED ORDER — HYDROMORPHONE HCL PF 1 MG/ML IJ SOLN
1.0000 mg | INTRAMUSCULAR | Status: DC | PRN
  Administered 2012-08-12 – 2012-08-13 (×5): 1 mg via INTRAMUSCULAR
  Filled 2012-08-12 (×5): qty 1

## 2012-08-12 MED ORDER — SODIUM CHLORIDE 0.9 % IV SOLN
INTRAVENOUS | Status: AC
  Filled 2012-08-12: qty 500

## 2012-08-12 MED ORDER — PHENOL 1.4 % MT LIQD: 1.0000 | OROMUCOSAL | Status: DC | PRN

## 2012-08-12 MED ORDER — INSULIN ASPART 100 UNIT/ML ~~LOC~~ SOLN
4.0000 [IU] | Freq: Three times a day (TID) | SUBCUTANEOUS | Status: DC
  Administered 2012-08-13 – 2012-08-15 (×7): 4 [IU] via SUBCUTANEOUS

## 2012-08-12 SURGICAL SUPPLY — 45 items
BAG DECANTER FOR FLEXI CONT (MISCELLANEOUS) ×2 IMPLANT
BENZOIN TINCTURE PRP APPL 2/3 (GAUZE/BANDAGES/DRESSINGS) ×2 IMPLANT
BLADE SURG ROTATE 9660 (MISCELLANEOUS) IMPLANT
BRUSH SCRUB EZ PLAIN DRY (MISCELLANEOUS) ×2 IMPLANT
CANISTER SUCTION 2500CC (MISCELLANEOUS) ×2 IMPLANT
CLEANER TIP ELECTROSURG 2X2 (MISCELLANEOUS) IMPLANT
CLOTH BEACON ORANGE TIMEOUT ST (SAFETY) ×2 IMPLANT
CONT SPEC 4OZ CLIKSEAL STRL BL (MISCELLANEOUS) IMPLANT
DERMABOND ADVANCED (GAUZE/BANDAGES/DRESSINGS) ×2
DERMABOND ADVANCED .7 DNX12 (GAUZE/BANDAGES/DRESSINGS) ×2 IMPLANT
DRAPE LAPAROTOMY 100X72X124 (DRAPES) ×2 IMPLANT
DRESSING TELFA 8X3 (GAUZE/BANDAGES/DRESSINGS) ×2 IMPLANT
ELECT REM PT RETURN 9FT ADLT (ELECTROSURGICAL) ×2
ELECTRODE REM PT RTRN 9FT ADLT (ELECTROSURGICAL) ×1 IMPLANT
EVACUATOR 1/8 PVC DRAIN (DRAIN) ×2 IMPLANT
GAUZE SPONGE 4X4 16PLY XRAY LF (GAUZE/BANDAGES/DRESSINGS) IMPLANT
GLOVE BIO SURGEON STRL SZ 6.5 (GLOVE) ×2 IMPLANT
GLOVE BIOGEL PI IND STRL 6.5 (GLOVE) ×1 IMPLANT
GLOVE BIOGEL PI INDICATOR 6.5 (GLOVE) ×1
GLOVE ECLIPSE 7.5 STRL STRAW (GLOVE) ×2 IMPLANT
GLOVE EXAM NITRILE LRG STRL (GLOVE) IMPLANT
GLOVE EXAM NITRILE XL STR (GLOVE) IMPLANT
GLOVE EXAM NITRILE XS STR PU (GLOVE) IMPLANT
GOWN BRE IMP SLV AUR LG STRL (GOWN DISPOSABLE) ×2 IMPLANT
GOWN BRE IMP SLV AUR XL STRL (GOWN DISPOSABLE) ×2 IMPLANT
GOWN STRL REIN 2XL LVL4 (GOWN DISPOSABLE) IMPLANT
KIT BASIN OR (CUSTOM PROCEDURE TRAY) ×2 IMPLANT
KIT ROOM TURNOVER OR (KITS) ×2 IMPLANT
NS IRRIG 1000ML POUR BTL (IV SOLUTION) ×6 IMPLANT
PACK LAMINECTOMY NEURO (CUSTOM PROCEDURE TRAY) ×2 IMPLANT
PAD ARMBOARD 7.5X6 YLW CONV (MISCELLANEOUS) ×6 IMPLANT
SPONGE GAUZE 4X4 12PLY (GAUZE/BANDAGES/DRESSINGS) ×2 IMPLANT
SPONGE SURGIFOAM ABS GEL SZ50 (HEMOSTASIS) ×2 IMPLANT
STAPLER SKIN PROX WIDE 3.9 (STAPLE) ×2 IMPLANT
STRIP CLOSURE SKIN 1/2X4 (GAUZE/BANDAGES/DRESSINGS) ×4 IMPLANT
SUT VIC AB 0 CT1 18XCR BRD8 (SUTURE) ×1 IMPLANT
SUT VIC AB 0 CT1 8-18 (SUTURE) ×1
SUT VIC AB 2-0 OS6 18 (SUTURE) ×6 IMPLANT
SUT VIC AB 3-0 CP2 18 (SUTURE) ×2 IMPLANT
SWAB CULTURE LIQ STUART DBL (MISCELLANEOUS) ×2 IMPLANT
SYR 20ML ECCENTRIC (SYRINGE) ×2 IMPLANT
TOWEL OR 17X24 6PK STRL BLUE (TOWEL DISPOSABLE) ×2 IMPLANT
TOWEL OR 17X26 10 PK STRL BLUE (TOWEL DISPOSABLE) ×2 IMPLANT
TUBE ANAEROBIC SPECIMEN COL (MISCELLANEOUS) ×2 IMPLANT
WATER STERILE IRR 1000ML POUR (IV SOLUTION) ×2 IMPLANT

## 2012-08-12 NOTE — Progress Notes (Signed)
ANTIBIOTIC CONSULT NOTE - INITIAL  Pharmacy Consult for Vancomycin Indication:  Empiric for Lumbar wound.  No Known Allergies  Patient Measurements: Weight 88.5 kg Height 173cm  Vital Signs: Temp: 98.2 F (36.8 C) (06/03 1915) BP: 103/50 mmHg (06/03 2115) Pulse Rate: 69 (06/03 2115)  Labs:  Recent Labs  08/12/12 1332  WBC 7.8  HGB 9.0*  PLT 295  CREATININE 0.66   Microbiology: Recent Results (from the past 720 hour(s))  SURGICAL PCR SCREEN     Status: None   Collection Time    08/12/12  1:25 PM      Result Value Range Status   MRSA, PCR NEGATIVE  NEGATIVE Final   Staphylococcus aureus NEGATIVE  NEGATIVE Final   Comment:            The Xpert SA Assay (FDA     approved for NASAL specimens     in patients over 37 years of age),     is one component of     a comprehensive surveillance     program.  Test performance has     been validated by [employer] for patients greater     than or equal to 66 year old.     It is not intended     to diagnose infection nor to     guide or monitor treatment.  GRAM STAIN     Status: None   Collection Time    08/12/12  4:33 PM      Result Value Range Status   Specimen Description WOUND BACK   Final   Special Requests PATIENT ON FOLLOWING ANCEF   Final   Gram Stain     Final   Value: ABUNDANT WBC PRESENT,BOTH PMN AND MONONUCLEAR     NO ORGANISMS SEEN   Report Status 08/12/2012 FINAL   Final    Medical History: Past Medical History  Diagnosis Date  . Neuromuscular disorder   . Depression   . High cholesterol   . Type II diabetes mellitus   . Diabetic peripheral neuropathy   . B12 deficiency anemia   . Arthritis     "pretty much from my neck to my feet" (07/08/2012)  . Chronic lower back pain     Medications:  Anti-infectives   Start     Dose/Rate Route Frequency Ordered Stop   08/13/12 0600  ceFAZolin (ANCEF) IVPB 2 g/50 mL premix     2 g 100 mL/hr over 30 Minutes Intravenous On call to O.R. 08/12/12 1324  08/12/12 1800   08/12/12 1832  bacitracin 50,000 Units in sodium chloride irrigation 0.9 % 500 mL irrigation  Status:  Discontinued       As needed 08/12/12 1832 08/12/12 1907   08/12/12 1733  bacitracin 16109 UNITS injection    Comments:  Reece Packer: cabinet override      08/12/12 1733 08/13/12 0544     Assessment: 51yo female admitted with a draining wound on her back.  She had a lumbar fusion about a month ago. She is now s/p I&D of her lumbar wound and we have been asked to provide empiric antibiotic dosing with IV Vancomycin.  She is ~ 88kg, has a creatinine of 0.66 and an estimated crcl > 50 mlmin.  Goal of Therapy:  Vancomycin trough level 15-20 mcg/ml  Plan:  Vancomycin 1gm IV every 12 hours Monitor renal function and adjust as needed F/U culture data  Nadara Mustard, PharmD., MS Clinical Pharmacist Pager:  [phone] Thank you for allowing pharmacy to be part of this patients care team. 08/12/2012,9:51 PM

## 2012-08-12 NOTE — H&P (Signed)
Holly Espinoza is an 52 y.o. female.   Chief Complaint: Wound infection HPI: The patient is a 52 year old female who had a back fusion approximately one month ago. She did initially well but then came back with a draining wound.. The superficial sutures put on oral antibiotics and the area that she was initially draining from healed but then another area began to drain was felt that the best answer would be to explore the wound washout we closed it with some drains in place and then put her on appropriate antibiotics. She now comes for IND of her lumbar wound. I had a long discussion with her regarding the risks and benefits of surgical intervention. We discussed alternative methods of therapy offered risks and benefits of nonintervention. She's had the customers questions and appears to understand. With this information in hand she has requested we proceed with surgery.  Past Medical History  Diagnosis Date  . Neuromuscular disorder   . Depression   . High cholesterol   . Type II diabetes mellitus   . Diabetic peripheral neuropathy   . B12 deficiency anemia   . Arthritis     "pretty much from my neck to my feet" (07/08/2012)  . Chronic lower back pain     Past Surgical History  Procedure Laterality Date  . Anterior cervical decomp/discectomy fusion  2012  . Abdominal hysterectomy  2001?  Marland Kitchen Cholecystectomy  1980's  . Posterior lumbar fusion  07/08/2012  . Tonsillectomy  ~ 1968  . Dilation and curettage of uterus  [phone]    "probably 3" (07/08/2012)  . Back surgery  2001    laminectomy    No family history on file. Social History:  reports that she has been smoking Cigarettes.  She has a 20 pack-year smoking history. She has never used smokeless tobacco. She reports that she does not drink alcohol or use illicit drugs.  Allergies: No Known Allergies  Medications Prior to Admission  Medication Sig Dispense Refill  . atorvastatin (LIPITOR) 40 MG tablet Take 40 mg by mouth daily.       . cyanocobalamin (,VITAMIN B-12,) 1000 MCG/ML injection Inject 1,000 mcg into the muscle every 30 (thirty) days. On the 1st of every month      . cyclobenzaprine (FLEXERIL) 10 MG tablet Take 1 tablet (10 mg total) by mouth 3 (three) times daily as needed for muscle spasms.  50 tablet  1  . doxycycline (DORYX) 100 MG DR capsule Take 100 mg by mouth 2 (two) times daily.      . DULoxetine (CYMBALTA) 60 MG capsule Take 60 mg by mouth daily.      Marland Kitchen gabapentin (NEURONTIN) 300 MG capsule Take 300 mg by mouth 3 (three) times daily.      . metFORMIN (GLUCOPHAGE) 1000 MG tablet Take 1,000 mg by mouth 2 (two) times daily with a meal.      . oxyCODONE (ROXICODONE) 15 MG immediate release tablet Take 1 tablet (15 mg total) by mouth every 4 (four) hours as needed for pain.  75 tablet  0  . Vitamin D, Ergocalciferol, (DRISDOL) 50000 UNITS CAPS Take 50,000 Units by mouth every 30 (thirty) days. On 1st of the month        Results for orders placed during the hospital encounter of 08/12/12 (from the past 48 hour(s))  GLUCOSE, CAPILLARY     Status: Abnormal   Collection Time    08/12/12  1:13 PM      Result Value Range  Glucose-Capillary 123 (*) 70 - 99 mg/dL  SURGICAL PCR SCREEN     Status: None   Collection Time    08/12/12  1:25 PM      Result Value Range   MRSA, PCR NEGATIVE  NEGATIVE   Staphylococcus aureus NEGATIVE  NEGATIVE   Comment:            The Xpert SA Assay (FDA     approved for NASAL specimens     in patients over 20 years of age),     is one component of     a comprehensive surveillance     program.  Test performance has     been validated by [employer] for patients greater     than or equal to 81 year old.     It is not intended     to diagnose infection nor to     guide or monitor treatment.  BASIC METABOLIC PANEL     Status: Abnormal   Collection Time    08/12/12  1:32 PM      Result Value Range   Sodium 133 (*) 135 - 145 mEq/L   Potassium 4.0  3.5 - 5.1 mEq/L    Chloride 97  96 - 112 mEq/L   CO2 26  19 - 32 mEq/L   Glucose, Bld 128 (*) 70 - 99 mg/dL   BUN 10  6 - 23 mg/dL   Creatinine, Ser 0.86  0.50 - 1.10 mg/dL   Calcium 9.3  8.4 - 57.8 mg/dL   GFR calc non Af Amer >90  >90 mL/min   GFR calc Af Amer >90  >90 mL/min   Comment:            The eGFR has been calculated     using the CKD EPI equation.     This calculation has not been     validated in all clinical     situations.     eGFR's persistently     <90 mL/min signify     possible Chronic Kidney Disease.  CBC     Status: Abnormal   Collection Time    08/12/12  1:32 PM      Result Value Range   WBC 7.8  4.0 - 10.5 K/uL   RBC 3.24 (*) 3.87 - 5.11 MIL/uL   Hemoglobin 9.0 (*) 12.0 - 15.0 g/dL   HCT 46.9 (*) 62.9 - 52.8 %   MCV 84.9  78.0 - 100.0 fL   MCH 27.8  26.0 - 34.0 pg   MCHC 32.7  30.0 - 36.0 g/dL   RDW 41.3  24.4 - 01.0 %   Platelets 295  150 - 400 K/uL  GLUCOSE, CAPILLARY     Status: None   Collection Time    08/12/12  3:32 PM      Result Value Range   Glucose-Capillary 97  70 - 99 mg/dL   No results found.  Review of systems not obtained due to patient factors.  There were no vitals taken for this visit.  The patient is awake or and oriented. She is no facial asymmetry. Gait is slow and very antalgic. Her back is tender to touch. She's decreased reflexes are normal strength and sensation. Her wound is for a what appears to be purulent material. Assessment/Plan Impression that of a wound infection after lumbar fusion. The plan is for an I and D of her lumbar wound.  Reinaldo Meeker, MD 08/12/2012,  5:09 PM

## 2012-08-12 NOTE — Op Note (Signed)
Preop diagnosis: Lumbar wound infection Postop diagnosis: Same Procedure: I and D. of lumbar wound Surgeon: Barri Neidlinger  After and placed the prone position the patient's back was prepped and draped in usual sterile fashion. Previous lumbar incision was reopened and some superficial purulent material was identified. As we incised the fascia large amount of purulent material under pressure was encountered and cultures were sent. The was then opened until the dura and instrumentation could be identified and then irrigated copiously with both saline irrigation and bacitracin a to the point we used 2 in half liters of irrigation. We look for any hidden pockets of purulent material and none could be identified. Instrumentation all appeared to be clean. We placed a small amount of Gelfoam over the dura with a bit of bleeding and left to epidural drain to the epidural space out through separate stab incisions. The was then closed in multiple layers of Vicryl on the muscle fascia subcutaneous and subcuticular tissues. Dermabond Steri-Strips were placed on the skin. Shortness was then applied the patient was extubated and taken to recovery room in stable condition.

## 2012-08-12 NOTE — Anesthesia Procedure Notes (Signed)
Procedure Name: Intubation Date/Time: 08/12/2012 5:54 PM Performed by: Jefm Miles E Pre-anesthesia Checklist: Patient identified, Timeout performed, Emergency Drugs available, Suction available and Patient being monitored Patient Re-evaluated:Patient Re-evaluated prior to inductionOxygen Delivery Method: Circle system utilized Preoxygenation: Pre-oxygenation with 100% oxygen Intubation Type: IV induction Ventilation: Mask ventilation without difficulty Laryngoscope Size: Mac and 3 Grade View: Grade I Tube type: Oral Tube size: 7.5 mm Number of attempts: 1 Airway Equipment and Method: Stylet and LTA kit utilized Placement Confirmation: ETT inserted through vocal cords under direct vision,  breath sounds checked- equal and bilateral and positive ETCO2 Secured at: 22 cm Tube secured with: Tape Dental Injury: Teeth and Oropharynx as per pre-operative assessment

## 2012-08-12 NOTE — Preoperative (Signed)
Beta Blockers   Reason not to administer Beta Blockers:Not Applicable 

## 2012-08-12 NOTE — Anesthesia Preprocedure Evaluation (Addendum)
Anesthesia Evaluation  Patient identified by MRN, date of birth, ID band Patient awake    Reviewed: Allergy & Precautions, H&P , NPO status , Patient's Chart, lab work & pertinent test results  Airway Mallampati: I TM Distance: >3 FB Neck ROM: full    Dental  (+) Partial Lower, Partial Upper and Dental Advisory Given   Pulmonary          Cardiovascular hypertension, Rhythm:regular Rate:Normal     Neuro/Psych PSYCHIATRIC DISORDERS  Neuromuscular disease    GI/Hepatic   Endo/Other  diabetes, Type 2, Oral Hypoglycemic Agents  Renal/GU      Musculoskeletal   Abdominal   Peds  Hematology  (+) anemia ,   Anesthesia Other Findings   Reproductive/Obstetrics                         Anesthesia Physical Anesthesia Plan  ASA: III  Anesthesia Plan: General   Post-op Pain Management:    Induction: Intravenous  Airway Management Planned: Oral ETT  Additional Equipment:   Intra-op Plan:   Post-operative Plan: Extubation in OR  Informed Consent: I have reviewed the patients History and Physical, chart, labs and discussed the procedure including the risks, benefits and alternatives for the proposed anesthesia with the patient or authorized representative who has indicated his/her understanding and acceptance.     Plan Discussed with: CRNA, Anesthesiologist and Surgeon  Anesthesia Plan Comments:         Anesthesia Quick Evaluation

## 2012-08-12 NOTE — Transfer of Care (Signed)
Immediate Anesthesia Transfer of Care Note  Patient: Holly Espinoza  Procedure(s) Performed: Procedure(s) with comments: LUMBAR WOUND DEBRIDEMENT (N/A) - Lumbar Wound Debridement  Patient Location: PACU  Anesthesia Type:General  Level of Consciousness: awake and alert   Airway & Oxygen Therapy: Patient Spontanous Breathing and Patient connected to nasal cannula oxygen  Post-op Assessment: Report given to PACU RN and Post -op Vital signs reviewed and stable  Post vital signs: Reviewed and stable  Complications: No apparent anesthesia complications

## 2012-08-12 NOTE — Anesthesia Postprocedure Evaluation (Signed)
Anesthesia Post Note  Patient: Holly Espinoza  Procedure(s) Performed: Procedure(s) (LRB): LUMBAR WOUND DEBRIDEMENT (N/A)  Anesthesia type: general  Patient location: PACU  Post pain: Pain level controlled  Post assessment: Patient's Cardiovascular Status Stable  Post vital signs: Reviewed and stable  Level of consciousness: sedated  Complications: No apparent anesthesia complications

## 2012-08-13 ENCOUNTER — Inpatient Hospital Stay (HOSPITAL_COMMUNITY): Payer: Medicaid Other

## 2012-08-13 LAB — GLUCOSE, CAPILLARY
Glucose-Capillary: 73 mg/dL (ref 70–99)
Glucose-Capillary: 88 mg/dL (ref 70–99)
Glucose-Capillary: 113 mg/dL — ABNORMAL HIGH (ref 70–99)
Glucose-Capillary: 113 mg/dL — ABNORMAL HIGH (ref 70–99)
Glucose-Capillary: 56 mg/dL — ABNORMAL LOW (ref 70–99)
Glucose-Capillary: 70 mg/dL (ref 70–99)
Glucose-Capillary: 90 mg/dL (ref 70–99)

## 2012-08-13 MED ORDER — SODIUM CHLORIDE 0.9 % IJ SOLN
10.0000 mL | INTRAMUSCULAR | Status: DC | PRN
  Administered 2012-08-15: 10 mL

## 2012-08-13 MED ORDER — RIFAMPIN 300 MG PO CAPS
300.0000 mg | ORAL_CAPSULE | Freq: Two times a day (BID) | ORAL | Status: DC
  Administered 2012-08-13 – 2012-08-15 (×5): 300 mg via ORAL
  Filled 2012-08-13 (×6): qty 1

## 2012-08-13 NOTE — Care Management Note (Signed)
    Page 1 of 1   08/15/2012     3:41:03 PM   CARE MANAGEMENT NOTE 08/15/2012  Patient:  MITHRA, SPANO   Account Number:  [id number]  Date Initiated:  08/13/2012  Documentation initiated by:  Elmer Bales  Subjective/Objective Assessment:   Pt admitted for lumbar would infection.  Lives at home with husband.     Action/Plan:   Will follow for discharge planning   Anticipated DC Date:  08/13/2012   Anticipated DC Plan:  HOME W HOME HEALTH SERVICES      DC Planning Services  CM consult      Choice offered to / List presented to:  C-1 Patient        HH arranged  IV Antibiotics      HH agency  [facility]   Status of service:  Completed, signed off Medicare Important Message given?   (If response is "NO", the following Medicare IM given date fields will be blank) Date Medicare IM given:   Date Additional Medicare IM given:    Discharge Disposition:  HOME W HOME HEALTH SERVICES  Per UR Regulation:  Reviewed for med. necessity/level of care/duration of stay  If discussed at Long Length of Stay Meetings, dates discussed:    Comments:  08/14/12 1430 Elmer Bales RN, MSN CM- Spoke with Corrie Dandy at [facility] to verify that IV medication orders were recieved. Pt is tentatively set for a home health visit tomorrow if discharged.  08/13/12 1530  Elmer Bales RN, MSN CM- Met with patient to discuss home IV antibiotics at discharge. Pt has chosen [facility] for IV antibiotic therapy.  Spoke with Hilda Lias from [facility], who has accepted the referral.

## 2012-08-13 NOTE — Progress Notes (Signed)
Paged iv nurse x 2 for picc line placement

## 2012-08-13 NOTE — Progress Notes (Signed)
Hypoglycemic Event  CBG: 56  Treatment: 15 GM carbohydrate snack  Symptoms: None  Follow-up CBG: Time:2125 CBG Result:70  Possible Reasons for Event: Inadequate meal intake  Comments/MD notified:No    Holly Espinoza  Remember to initiate Hypoglycemia Order Set & complete

## 2012-08-13 NOTE — Progress Notes (Signed)
UR complete.  Al Bracewell RN, MSN 

## 2012-08-13 NOTE — Progress Notes (Signed)
Patient ID: Holly Espinoza, female   DOB: 03/04/1961, 52 y.o.   MRN: [id number] Afeb. VSS Feels significantly better.  Drain working well. Cultures with no growth as of yet. Will get PICC placed, and treat for gm positive which is by far the most likely.

## 2012-08-13 NOTE — Progress Notes (Signed)
Peripherally Inserted Central Catheter/Midline Placement  The IV Nurse has discussed with the patient and/or persons authorized to consent for the patient, the purpose of this procedure and the potential benefits and risks involved with this procedure.  The benefits include less needle sticks, lab draws from the catheter and patient may be discharged home with the catheter.  Risks include, but not limited to, infection, bleeding, blood clot (thrombus formation), and puncture of an artery; nerve damage and irregular heat beat.  Alternatives to this procedure were also discussed. PICC placed by Ronie Spies RN.  PICC/Midline Placement Documentation  PICC / Midline Single Lumen 08/13/12 PICC Right Basilic (Active)       Christeen Douglas 08/13/2012, 10:32 PM

## 2012-08-13 NOTE — Progress Notes (Signed)
INITIAL NUTRITION ASSESSMENT  Pt meets criteria for SEVERE MALNUTRITION in the context of acute illness as evidenced by 15% weight loss x 3 weeks and estimated intake of < 50% in > 5 days.  DOCUMENTATION CODES Per approved criteria  -Severe malnutrition in the context of acute illness or injury   INTERVENTION: Magic cup TID between meals, each supplement provides 290 kcal and 9 grams of protein.  NUTRITION DIAGNOSIS: Inadequate oral inake related to pain as evidenced by 15% weight loss x 3 weeks.   Goal: Pt to meet >/= 90% of their estimated nutrition needs.   Monitor:  PO intake, supplement acceptance, weight trend, labs  Reason for Assessment: Pt identified as at nutrition risk on the Malnutrition Screen Tool  52 y.o. female  Admitting Dx: Wound infection   ASSESSMENT: Pt admitted with wound infection, s/p I&D.  Per pt she has not ate in 3 weeks due to being in bed in pain.   Nutrition Focused Physical Exam:  Subcutaneous Fat:  Orbital Region: WNL Upper Arm Region: WNL, sagging skin likely due to weight loss Thoracic and Lumbar Region: WNL  Muscle:  Temple Region: mild wasting Clavicle Bone Region: mild-moderate wasting Clavicle and Acromion Bone Region: WNL Scapular Bone Region: WNL Dorsal Hand: WNL Patellar Region: WNL Anterior Thigh Region: mild-moderate wasting Posterior Calf Region: WNL  Edema: not present   Height: Ht Readings from Last 1 Encounters:  08/12/12 5\' 8"  (1.727 m)    Weight: Wt Readings from Last 1 Encounters:  08/12/12 170 lb (77.111 kg)    Ideal Body Weight: 63.6 kg   % Ideal Body Weight: 121%  Wt Readings from Last 10 Encounters:  08/12/12 170 lb (77.111 kg)  08/12/12 170 lb (77.111 kg)  07/08/12 195 lb (88.451 kg)  07/08/12 195 lb (88.451 kg)  07/01/12 201 lb 6.4 oz (91.354 kg)  06/05/12 203 lb (92.08 kg)    Usual Body Weight: 200 lb   % Usual Body Weight: 85%  BMI:  Body mass index is 25.85 kg/(m^2).  Overweight  Estimated Nutritional Needs: Kcal: 1800-2000 Protein: 90-110 grams Fluid: > 1.8 L/day  Skin: incision  Diet Order: Carb Control Meal Completion: 50%  EDUCATION NEEDS: -No education needs identified at this time   Intake/Output Summary (Last 24 hours) at 08/13/12 1157 Last data filed at 08/13/12 0745  Gross per 24 hour  Intake   2080 ml  Output   1140 ml  Net    940 ml    Last BM: PTA   Labs:   Recent Labs Lab 08/12/12 1332  NA 133*  K 4.0  CL 97  CO2 26  BUN 10  CREATININE 0.66  CALCIUM 9.3  GLUCOSE 128*    CBG (last 3)   Recent Labs  08/12/12 2237 08/13/12 0648 08/13/12 1133  GLUCAP 73 88 113*    Scheduled Meds: . atorvastatin  40 mg Oral Daily  . DULoxetine  60 mg Oral Daily  . gabapentin  300 mg Oral TID  . insulin aspart  0-15 Units Subcutaneous TID WC  . insulin aspart  0-5 Units Subcutaneous QHS  . insulin aspart  4 Units Subcutaneous TID WC  . metFORMIN  1,000 mg Oral BID WC  . pantoprazole (PROTONIX) IV  40 mg Intravenous QHS  . rifampin  300 mg Oral Q12H  . sodium chloride  3 mL Intravenous Q12H  . vancomycin  1,000 mg Intravenous Q12H    Continuous Infusions: . 0.45 % NaCl with KCl 20 mEq /  L 75 mL/hr at 08/12/12 2231  . sodium chloride      Past Medical History  Diagnosis Date  . Neuromuscular disorder   . Depression   . High cholesterol   . Type II diabetes mellitus   . Diabetic peripheral neuropathy   . B12 deficiency anemia   . Arthritis     "pretty much from my neck to my feet" (07/08/2012)  . Chronic lower back pain     Past Surgical History  Procedure Laterality Date  . Anterior cervical decomp/discectomy fusion  2012  . Abdominal hysterectomy  2001?  Marland Kitchen Cholecystectomy  1980's  . Posterior lumbar fusion  07/08/2012  . Tonsillectomy  ~ 1968  . Dilation and curettage of uterus  [phone]    "probably 3" (07/08/2012)  . Back surgery  2001    laminectomy  . Fracture surgery      C4-7 Surgery for  fracture    Kendell Bane RD, LDN, CNSC [phone] Pager [phone] After Hours Pager

## 2012-08-14 ENCOUNTER — Encounter (HOSPITAL_COMMUNITY): Payer: Self-pay | Admitting: Neurosurgery

## 2012-08-14 LAB — GLUCOSE, CAPILLARY
Glucose-Capillary: 96 mg/dL (ref 70–99)
Glucose-Capillary: 91 mg/dL (ref 70–99)
Glucose-Capillary: 90 mg/dL (ref 70–99)
Glucose-Capillary: 84 mg/dL (ref 70–99)

## 2012-08-14 NOTE — Progress Notes (Signed)
Patient ID: Holly Espinoza, female   DOB: [birth year], 52 y.o.   MRN: [id number] Subjective: Patient reports starting to feel better  Objective: Vital signs in last 24 hours: Temp:  [97.9 F (36.6 C)-98.1 F (36.7 C)] 98 F (36.7 C) (06/04 2135) Pulse Rate:  [74-75] 74 (06/04 2314) Resp:  [18-22] 22 (06/04 2135) BP: (81-128)/(38-60) 128/49 mmHg (06/05 0503) SpO2:  [96 %-99 %] 97 % (06/04 2314)  Intake/Output from previous day: 06/04 0701 - 06/05 0700 In: 960 [P.O.:960] Out: 50 [Drains:50] Intake/Output this shift:    Wound:clean and dry  Lab Results:  Recent Labs  08/12/12 1332  WBC 7.8  HGB 9.0*  HCT 27.5*  PLT 295   BMET  Recent Labs  08/12/12 1332  NA 133*  K 4.0  CL 97  CO2 26  GLUCOSE 128*  BUN 10  CREATININE 0.66  CALCIUM 9.3    Studies/Results: Dg Chest Port 1 View  08/13/2012   *RADIOLOGY REPORT*  Clinical Data: PICC line placement  PORTABLE CHEST - 1 VIEW  Comparison: 09/05/2008  Findings: Right-sided PICC line tip terminates over the cavoatrial junction.  Cervical fusion hardware noted.  Heart size is normal. No pneumothorax.  Bilateral costophrenic angles are omitted from the field of view. The lungs are clear in their visualized aspects.  IMPRESSION: Right-sided PICC line tip over cavoatrial junction.   Original Report Authenticated By: Christiana Pellant, M.D.    Assessment/Plan: Doing better. Would like to stay one more day. Cultures growing a bit of staph. Will use rifampin and vanco. Will have home health arranged in anticipation of d/c tomorrow.   LOS: 2 days  as above   Reinaldo Meeker, MD 08/14/2012, 9:42 AM

## 2012-08-14 NOTE — Progress Notes (Signed)
Inpatient Diabetes Program Recommendations  AACE/ADA: New Consensus Statement on Inpatient Glycemic Control (2013)  Target Ranges:  Prepandial:   less than 140 mg/dL      Peak postprandial:   less than 180 mg/dL (1-2 hours)      Critically ill patients:  140 - 180 mg/dL  Results for [name], [name] (MRN [id number]) as of 08/14/2012 11:16  Ref. Range 08/13/2012 16:27 08/13/2012 21:06 08/13/2012 21:25 08/13/2012 23:07 08/14/2012 06:38  Glucose-Capillary Latest Range: 70-99 mg/dL 409 (H) 56 (L) 70 90 96    Inpatient Diabetes Program Recommendations Correction (SSI): decrease to SENSITIVE scale Insulin - Meal Coverage: discontinue Novolog meal coverage 4 units  Thank you  Piedad Climes BSN, RN,CDE Inpatient Diabetes Coordinator [phone] (team pager)

## 2012-08-14 NOTE — Progress Notes (Signed)
08/14/12 1430 Elmer Bales RN, MSN CM- Spoke with Corrie Dandy at [facility] to verify that IV medication orders were recieved. Pt is tentatively set for a home health visit tomorrow if discharged.

## 2012-08-15 DIAGNOSIS — E43 Unspecified severe protein-calorie malnutrition: Secondary | ICD-10-CM | POA: Insufficient documentation

## 2012-08-15 LAB — GLUCOSE, CAPILLARY
Glucose-Capillary: 97 mg/dL (ref 70–99)
Glucose-Capillary: 92 mg/dL (ref 70–99)

## 2012-08-15 LAB — WOUND CULTURE

## 2012-08-15 LAB — VANCOMYCIN, TROUGH: Vancomycin Tr: 9.7 ug/mL — ABNORMAL LOW (ref 10.0–20.0)

## 2012-08-15 MED ORDER — RIFAMPIN 300 MG PO CAPS: 300.0000 mg | ORAL_CAPSULE | Freq: Two times a day (BID) | ORAL | Status: DC

## 2012-08-15 MED ORDER — VANCOMYCIN HCL IN DEXTROSE 1-5 GM/200ML-% IV SOLN
1000.0000 mg | Freq: Three times a day (TID) | INTRAVENOUS | Status: DC
  Administered 2012-08-15 (×2): 1000 mg via INTRAVENOUS
  Filled 2012-08-15 (×4): qty 200

## 2012-08-15 MED ORDER — VANCOMYCIN HCL IN DEXTROSE 1-5 GM/200ML-% IV SOLN: 1000.0000 mg | Freq: Three times a day (TID) | INTRAVENOUS | Status: DC

## 2012-08-15 MED ORDER — OXYCODONE HCL 15 MG PO TABS: 15.0000 mg | ORAL_TABLET | ORAL | Status: DC | PRN

## 2012-08-15 MED ORDER — HEPARIN SOD (PORK) LOCK FLUSH 100 UNIT/ML IV SOLN
250.0000 [IU] | INTRAVENOUS | Status: DC | PRN
  Administered 2012-08-15: 250 [IU]
  Filled 2012-08-15: qty 3

## 2012-08-15 MED ORDER — HEPARIN SOD (PORK) LOCK FLUSH 100 UNIT/ML IV SOLN
250.0000 [IU] | Freq: Every day | INTRAVENOUS | Status: DC
  Filled 2012-08-15: qty 3

## 2012-08-15 NOTE — Progress Notes (Signed)
Pt being discharged home,  hemovac dc'd, picc line staying in for IV home meds.  Rx. And instructions given to patient. Pt states she is very emotional and she doesn't feel like her self. Somewhat anxious about going home.  Delynn Pursley Charity fundraiser.SCRN

## 2012-08-15 NOTE — Progress Notes (Signed)
ANTIBIOTIC CONSULT NOTE - FOLLOW UP  Pharmacy Consult for vancomycin Indication: wound infection  No Known Allergies  Patient Measurements: Height: 5\' 8"  (172.7 cm) Weight: 170 lb (77.111 kg) IBW/kg (Calculated) : 63.9  Vital Signs: Temp: 98.9 F (37.2 C) (06/05 2014) Temp src: Oral (06/05 2014) BP: 105/45 mmHg (06/05 2014) Pulse Rate: 72 (06/05 2014) Intake/Output from previous day: 06/05 0701 - 06/06 0700 In: 240 [P.O.:240] Out: 50 [Drains:50] Intake/Output from this shift: Total I/O In: -  Out: 50 [Drains:50]  Labs:  Recent Labs  08/12/12 1332  WBC 7.8  HGB 9.0*  PLT 295  CREATININE 0.66   Estimated Creatinine Clearance: 89.9 ml/min (by C-G formula based on Cr of 0.66).  Recent Labs  08/14/12 2304  VANCOTROUGH 9.7*     Microbiology: Recent Results (from the past 720 hour(s))  SURGICAL PCR SCREEN     Status: None   Collection Time    08/12/12  1:25 PM      Result Value Range Status   MRSA, PCR NEGATIVE  NEGATIVE Final   Staphylococcus aureus NEGATIVE  NEGATIVE Final   Comment:            The Xpert SA Assay (FDA     approved for NASAL specimens     in patients over 31 years of age),     is one component of     a comprehensive surveillance     program.  Test performance has     been validated by [employer] for patients greater     than or equal to 73 year old.     It is not intended     to diagnose infection nor to     guide or monitor treatment.  WOUND CULTURE     Status: None   Collection Time    08/12/12  4:33 PM      Result Value Range Status   Specimen Description WOUND BACK   Final   Special Requests PATIENT ON FOLLOWING ANCEF   Final   Gram Stain     Final   Value: ABUNDANT WBC PRESENT,BOTH PMN AND MONONUCLEAR     NO ORGANISMS SEEN     Performed at [facility]   Culture     Final   Value: FEW STAPHYLOCOCCUS AUREUS     Note: RIFAMPIN AND GENTAMICIN SHOULD NOT BE USED AS SINGLE DRUGS FOR TREATMENT OF STAPH INFECTIONS.   Report Status PENDING   Incomplete  ANAEROBIC CULTURE     Status: None   Collection Time    08/12/12  4:33 PM      Result Value Range Status   Specimen Description WOUND BACK   Final   Special Requests PATIENT ON FOLLOWING ANCEF   Final   Gram Stain     Final   Value: ABUNDANT WBC PRESENT,BOTH PMN AND MONONUCLEAR     NO ORGANISMS SEEN     Performed at [facility]   Culture     Final   Value: NO ANAEROBES ISOLATED; CULTURE IN PROGRESS FOR 5 DAYS   Report Status PENDING   Incomplete  GRAM STAIN     Status: None   Collection Time    08/12/12  4:33 PM      Result Value Range Status   Specimen Description WOUND BACK   Final   Special Requests PATIENT ON FOLLOWING ANCEF   Final   Gram Stain     Final   Value:  ABUNDANT WBC PRESENT,BOTH PMN AND MONONUCLEAR     NO ORGANISMS SEEN   Report Status 08/12/2012 FINAL   Final    Anti-infectives   Start     Dose/Rate Route Frequency Ordered Stop   08/15/12 0400  vancomycin (VANCOCIN) IVPB 1000 mg/200 mL premix     1,000 mg 200 mL/hr over 60 Minutes Intravenous Every 8 hours 08/15/12 0054     08/13/12 1000  rifampin (RIFADIN) capsule 300 mg     300 mg Oral Every 12 hours 08/13/12 0859     08/13/12 0600  ceFAZolin (ANCEF) IVPB 2 g/50 mL premix     2 g 100 mL/hr over 30 Minutes Intravenous On call to O.R. 08/12/12 1324 08/12/12 1800   08/12/12 2300  vancomycin (VANCOCIN) IVPB 1000 mg/200 mL premix  Status:  Discontinued     1,000 mg 200 mL/hr over 60 Minutes Intravenous Every 12 hours 08/12/12 2201 08/15/12 0054   08/12/12 1832  bacitracin 50,000 Units in sodium chloride irrigation 0.9 % 500 mL irrigation  Status:  Discontinued       As needed 08/12/12 1832 08/12/12 1907   08/12/12 1733  bacitracin 41324 UNITS injection    Comments:  Reece Packer: cabinet override      08/12/12 1733 08/13/12 0544      Assessment: 52yo female had lumbar fusion with subsequent draining wound, Staph aureus found on Cx, to continue IV ABX at  discharge with home health; initial trough low at 9.7 with last dose prior to trough given ~2hr late.  Goal of Therapy:  Vancomycin trough level 15-20 mcg/ml  Plan:  Will change vancomycin to 1000mg  IV Q8H for calculated trough of ~16; if home health requires less frequent dosing, 2000mg  IV Q12H yields calculated trough of ~17 but with much higher peak; recommend continued close management.  Vernard Gambles, PharmD, BCPS  08/15/2012,12:54 AM

## 2012-08-16 NOTE — Discharge Summary (Signed)
Physician Discharge Summary  Patient ID: Holly Espinoza MRN: [id number] DOB/AGE: 08/01/1960 52 y.o.  Admit date: 08/12/2012 Discharge date: 08/16/2012  Admission Diagnoses:Lumbar wound infection   Discharge Diagnoses: Lumbar wound infection  Active Problems:   Protein-calorie malnutrition, severe   Discharged Condition: good  Hospital Course: Holly Espinoza was admitted and taken to the operating room where she underwent and open debridement of what was an infected wound. Post op she was placed on rifampin and vancomycin for STAPHYLOCOCCUS AUREUS. Neurologically she did well. Post op she did have less pain. She was sent home with IV vancomycin, and rifampin for 6 weeks.    Consults: ID  Significant Diagnostic Studies: microbiology: wound culture: positive for Staphylococcus aureus  Treatments: antibiotics: vancomycin and rifampin and surgery: I and D. of lumbar wound   Discharge Exam: Blood pressure 111/54, pulse 77, temperature 98.3 F (36.8 C), temperature source Oral, resp. rate 16, height 5\' 8"  (1.727 m), weight 77.111 kg (170 lb), SpO2 99.00%. General appearance: alert, cooperative and appears stated age Neurologic: Grossly normal, wound was clean and dry.   Disposition: 01-Home or Self Care     Medication List    TAKE these medications       atorvastatin 40 MG tablet  Commonly known as:  LIPITOR  Take 40 mg by mouth daily.     cyanocobalamin 1000 MCG/ML injection  Commonly known as:  (VITAMIN B-12)  Inject 1,000 mcg into the muscle every 30 (thirty) days. On the 1st of every month     cyclobenzaprine 10 MG tablet  Commonly known as:  FLEXERIL  Take 1 tablet (10 mg total) by mouth 3 (three) times daily as needed for muscle spasms.     doxycycline 100 MG DR capsule  Commonly known as:  DORYX  Take 100 mg by mouth 2 (two) times daily.     DULoxetine 60 MG capsule  Commonly known as:  CYMBALTA  Take 60 mg by mouth daily.     gabapentin 300 MG capsule  Commonly  known as:  NEURONTIN  Take 300 mg by mouth 3 (three) times daily.     metFORMIN 1000 MG tablet  Commonly known as:  GLUCOPHAGE  Take 1,000 mg by mouth 2 (two) times daily with a meal.     oxyCODONE 15 MG immediate release tablet  Commonly known as:  ROXICODONE  Take 1 tablet (15 mg total) by mouth every 4 (four) hours as needed for pain.     rifampin 300 MG capsule  Commonly known as:  RIFADIN  Take 1 capsule (300 mg total) by mouth every 12 (twelve) hours.     vancomycin 1 GM/200ML Soln  Commonly known as:  VANCOCIN  Inject 200 mLs (1,000 mg total) into the vein every 8 (eight) hours.     Vitamin D (Ergocalciferol) 50000 UNITS Caps  Commonly known as:  DRISDOL  Take 50,000 Units by mouth every 30 (thirty) days. On 1st of the month         Signed: Joyann Spidle L 08/16/2012, 1:14 AM

## 2012-08-17 LAB — ANAEROBIC CULTURE

## 2012-08-21 ENCOUNTER — Emergency Department: Payer: Self-pay | Admitting: Emergency Medicine

## 2012-08-21 LAB — CBC
HCT: 29.1 % — ABNORMAL LOW (ref 35.0–47.0)
HGB: 9.7 g/dL — ABNORMAL LOW (ref 12.0–16.0)
MCH: 27.4 pg (ref 26.0–34.0)
MCHC: 33.2 g/dL (ref 32.0–36.0)
MCV: 83 fL (ref 80–100)
Platelet: 533 10*3/uL — ABNORMAL HIGH (ref 150–440)
RBC: 3.52 10*6/uL — ABNORMAL LOW (ref 3.80–5.20)
RDW: 15.2 % — ABNORMAL HIGH (ref 11.5–14.5)
WBC: 10 10*3/uL (ref 3.6–11.0)

## 2012-08-21 LAB — URINALYSIS, COMPLETE
Bilirubin,UR: NEGATIVE
Blood: NEGATIVE
Glucose,UR: NEGATIVE mg/dL (ref 0–75)
Hyaline Cast: 3
Ketone: NEGATIVE
Leukocyte Esterase: NEGATIVE
Nitrite: NEGATIVE
Ph: 6 (ref 4.5–8.0)
Protein: NEGATIVE
RBC,UR: NONE SEEN /HPF (ref 0–5)
Specific Gravity: 1.005 (ref 1.003–1.030)
Squamous Epithelial: 1
WBC UR: NONE SEEN /HPF (ref 0–5)

## 2012-08-21 LAB — COMPREHENSIVE METABOLIC PANEL
Albumin: 3.4 g/dL (ref 3.4–5.0)
Alkaline Phosphatase: 123 U/L (ref 50–136)
Anion Gap: 9 (ref 7–16)
BUN: 8 mg/dL (ref 7–18)
Bilirubin,Total: 0.2 mg/dL (ref 0.2–1.0)
Calcium, Total: 9.9 mg/dL (ref 8.5–10.1)
Chloride: 102 mmol/L (ref 98–107)
Co2: 26 mmol/L (ref 21–32)
Creatinine: 1.03 mg/dL (ref 0.60–1.30)
EGFR (African American): >60
EGFR (Non-African Amer.): >60
Glucose: 112 mg/dL — ABNORMAL HIGH (ref 65–99)
Osmolality: 273 (ref 275–301)
Potassium: 3.8 mmol/L (ref 3.5–5.1)
SGOT(AST): 14 U/L — ABNORMAL LOW (ref 15–37)
SGPT (ALT): 18 U/L (ref 12–78)
Sodium: 137 mmol/L (ref 136–145)
Total Protein: 8.3 g/dL — ABNORMAL HIGH (ref 6.4–8.2)

## 2012-08-21 LAB — VANCOMYCIN, TROUGH: Vancomycin, Trough: 7 ug/mL — ABNORMAL LOW (ref 10–20)

## 2012-08-21 LAB — TROPONIN I: Troponin-I: <0.02 ng/mL

## 2012-08-27 ENCOUNTER — Other Ambulatory Visit: Payer: Self-pay | Admitting: Neurosurgery

## 2012-08-27 LAB — CULTURE, BLOOD (SINGLE)

## 2012-08-27 LAB — VANCOMYCIN, TROUGH: Vancomycin, Trough: 14 ug/mL (ref 10–20)

## 2012-09-03 DIAGNOSIS — R600 Localized edema: Secondary | ICD-10-CM | POA: Insufficient documentation

## 2012-09-18 DIAGNOSIS — D649 Anemia, unspecified: Secondary | ICD-10-CM | POA: Insufficient documentation

## 2012-09-22 ENCOUNTER — Emergency Department (HOSPITAL_COMMUNITY)
Admission: EM | Admit: 2012-09-22 | Discharge: 2012-09-23 | Disposition: A | Discharge status: Home / Self Care | Payer: Medicaid Other | Attending: Emergency Medicine | Admitting: Emergency Medicine

## 2012-09-22 ENCOUNTER — Encounter (HOSPITAL_COMMUNITY): Payer: Self-pay | Admitting: Family Medicine

## 2012-09-22 DIAGNOSIS — M545 Low back pain, unspecified: Secondary | ICD-10-CM | POA: Insufficient documentation

## 2012-09-22 DIAGNOSIS — M129 Arthropathy, unspecified: Secondary | ICD-10-CM | POA: Insufficient documentation

## 2012-09-22 DIAGNOSIS — G709 Myoneural disorder, unspecified: Secondary | ICD-10-CM | POA: Insufficient documentation

## 2012-09-22 DIAGNOSIS — Z79899 Other long term (current) drug therapy: Secondary | ICD-10-CM | POA: Insufficient documentation

## 2012-09-22 DIAGNOSIS — F329 Major depressive disorder, single episode, unspecified: Secondary | ICD-10-CM | POA: Insufficient documentation

## 2012-09-22 DIAGNOSIS — G8929 Other chronic pain: Secondary | ICD-10-CM | POA: Insufficient documentation

## 2012-09-22 DIAGNOSIS — Y838 Other surgical procedures as the cause of abnormal reaction of the patient, or of later complication, without mention of misadventure at the time of the procedure: Secondary | ICD-10-CM | POA: Insufficient documentation

## 2012-09-22 DIAGNOSIS — T82598A Other mechanical complication of other cardiac and vascular devices and implants, initial encounter: Secondary | ICD-10-CM | POA: Insufficient documentation

## 2012-09-22 DIAGNOSIS — E1142 Type 2 diabetes mellitus with diabetic polyneuropathy: Secondary | ICD-10-CM | POA: Insufficient documentation

## 2012-09-22 DIAGNOSIS — F3289 Other specified depressive episodes: Secondary | ICD-10-CM | POA: Insufficient documentation

## 2012-09-22 DIAGNOSIS — F17200 Nicotine dependence, unspecified, uncomplicated: Secondary | ICD-10-CM | POA: Insufficient documentation

## 2012-09-22 DIAGNOSIS — T82898A Other specified complication of vascular prosthetic devices, implants and grafts, initial encounter: Secondary | ICD-10-CM

## 2012-09-22 DIAGNOSIS — D518 Other vitamin B12 deficiency anemias: Secondary | ICD-10-CM | POA: Insufficient documentation

## 2012-09-22 DIAGNOSIS — E7800 Pure hypercholesterolemia, unspecified: Secondary | ICD-10-CM | POA: Insufficient documentation

## 2012-09-22 DIAGNOSIS — E1149 Type 2 diabetes mellitus with other diabetic neurological complication: Secondary | ICD-10-CM | POA: Insufficient documentation

## 2012-09-22 DIAGNOSIS — R110 Nausea: Secondary | ICD-10-CM | POA: Insufficient documentation

## 2012-09-22 MED ORDER — HYDROMORPHONE HCL PF 1 MG/ML IJ SOLN
1.0000 mg | Freq: Once | INTRAMUSCULAR | Status: AC
  Administered 2012-09-22: 1 mg via INTRAVENOUS
  Filled 2012-09-22: qty 1

## 2012-09-22 MED ORDER — ONDANSETRON HCL 4 MG/2ML IJ SOLN
4.0000 mg | Freq: Once | INTRAMUSCULAR | Status: AC
  Administered 2012-09-22: 4 mg via INTRAVENOUS
  Filled 2012-09-22: qty 2

## 2012-09-22 MED ORDER — VANCOMYCIN HCL IN DEXTROSE 750-5 MG/150ML-% IV SOLN
750.0000 mg | Freq: Once | INTRAVENOUS | Status: AC
Start: 2012-09-22 — End: 2012-09-23
  Administered 2012-09-22: 750 mg via INTRAVENOUS
  Filled 2012-09-22: qty 150

## 2012-09-22 NOTE — ED Provider Notes (Signed)
History    CSN: [id number] Arrival date & time 09/22/12  1746  First MD Initiated Contact with Patient 09/22/12 2259     Chief Complaint  Patient presents with  . PICC line out    (Consider location/radiation/quality/duration/timing/severity/associated sxs/prior Treatment) The history is provided by the patient.   52 year old female who is getting vancomycin every 12 hours through a PICC line at home and nurse accidentally pulled her PICC line partially out. She had not taken her dose of vancomycin last night or this morning because she was having nausea and states that vancomycin makes her nausea worse. She has come in to have the PICC line replaced. She has been getting the vancomycin to treat an infection of a surgical site infection from lumbar surgery. She has been taking oxycodone for pain and has not had a dose of oxycodone and 6 hours and is requesting pain medication. Past Medical History  Diagnosis Date  . Neuromuscular disorder   . Depression   . High cholesterol   . Type II diabetes mellitus   . Diabetic peripheral neuropathy   . B12 deficiency anemia   . Arthritis     "pretty much from my neck to my feet" (07/08/2012)  . Chronic lower back pain    Past Surgical History  Procedure Laterality Date  . Anterior cervical decomp/discectomy fusion  2012  . Abdominal hysterectomy  2001?  Marland Kitchen Cholecystectomy  1980's  . Posterior lumbar fusion  07/08/2012  . Tonsillectomy  ~ 1968  . Dilation and curettage of uterus  [phone]    "probably 3" (07/08/2012)  . Back surgery  2001    laminectomy  . Fracture surgery      C4-7 Surgery for fracture  . Lumbar wound debridement N/A 08/12/2012    Procedure: LUMBAR WOUND DEBRIDEMENT;  Surgeon: Reinaldo Meeker, MD;  Location: [facility];  Service: Neurosurgery;  Laterality: N/A;  Lumbar Wound Debridement   History reviewed. No pertinent family history. History  Substance Use Topics  . Smoking status: Current Every Day Smoker --  0.50 packs/day for 40 years    Types: Cigarettes  . Smokeless tobacco: Never Used  . Alcohol Use: No   OB History   Grav Para Term Preterm Abortions TAB SAB Ect Mult Living                 Review of Systems  All other systems reviewed and are negative.    Allergies  Review of patient's allergies indicates no known allergies.  Home Medications   Current Outpatient Rx  Name  Route  Sig  Dispense  Refill  . atorvastatin (LIPITOR) 40 MG tablet   Oral   Take 40 mg by mouth daily.         . cyanocobalamin (,VITAMIN B-12,) 1000 MCG/ML injection   Intramuscular   Inject 1,000 mcg into the muscle every 30 (thirty) days. On the 1st of every month         . diazepam (VALIUM) 5 MG tablet   Oral   Take 5 mg by mouth 4 (four) times daily as needed for anxiety. For muscle spasms         . DULoxetine (CYMBALTA) 60 MG capsule   Oral   Take 60 mg by mouth daily.         Marland Kitchen gabapentin (NEURONTIN) 300 MG capsule   Oral   Take 300 mg by mouth 3 (three) times daily.         Marland Kitchen  metFORMIN (GLUCOPHAGE) 1000 MG tablet   Oral   Take 1,000 mg by mouth 2 (two) times daily with a meal.         . ondansetron (ZOFRAN) 4 MG tablet   Oral   Take 4 mg by mouth every 8 (eight) hours as needed for nausea. For nausea         . oxyCODONE (ROXICODONE) 15 MG immediate release tablet   Oral   Take 1 tablet (15 mg total) by mouth every 4 (four) hours as needed for pain.   75 tablet   0   . vancomycin (VANCOCIN) 1 GM/200ML SOLN   Intravenous   Inject 200 mLs (1,000 mg total) into the vein every 8 (eight) hours.   24000 mL   0    BP 120/70  Pulse 81  Temp(Src) 98.9 F (37.2 C) (Oral)  Resp 20  SpO2 95% Physical Exam  Nursing note and vitals reviewed.  52 year old female, resting comfortably and in no acute distress. Vital signs are normal. Oxygen saturation is 95%, which is normal. Head is normocephalic and atraumatic. PERRLA, EOMI. Oropharynx is clear. Neck is nontender and  supple without adenopathy or JVD. Back is nontender and there is no CVA tenderness. Lungs are clear without rales, wheezes, or rhonchi. Chest is nontender. Heart has regular rate and rhythm without murmur. Abdomen is soft, flat, nontender without masses or hepatosplenomegaly and peristalsis is normoactive. Extremities have no cyanosis or edema, full range of motion is present. PICC line in the right antecubital has been partially pulled out with 17 cm still within her arm. There is no sign of infection at the PICC line site. Skin is warm and dry without rash. Neurologic: Mental status is normal, cranial nerves are intact, there are no motor or sensory deficits.  ED Course  Procedures (including critical care time) 1. PICC line complication with accidental partial removal, initial encounter     MDM  Partial removal of PICC line. Old records are reviewed and she is getting vancomycin for her surgical site infection. She states that the dose has been decreased from 1000 mg twice a day this 750 mg twice a day. A peripheral IV line has been started and she'll be given her a dose of vancomycin and a dose of hydromorphone for pain. Arrange this will be made for a PICC line insertion tomorrow morning.  Dione Booze, MD 09/23/12 [phone]

## 2012-09-22 NOTE — ED Notes (Signed)
PICC line has come over halfway out.

## 2012-09-22 NOTE — ED Notes (Signed)
Site accessed and covered with guaze

## 2012-09-22 NOTE — ED Notes (Signed)
Per pt sts nurse came to her house today and accidentally pulled PICC line almost all the way out. Pt here to have replaced

## 2012-09-22 NOTE — Progress Notes (Signed)
At bedside to assess PICC.  Found that the PICC was out at 18 cm, insertion site was covered only gauze dressing which patient states gauze was placed over site upon admission to ER.  Site cleaned per protocol and biopatch added to site, tegaderm placed over site after securing line with steristrips, there is approximately 5 cm under this dressing.  Remaining exposed line was also cleaned separately and then secured and covered with separate tegaderm dressing.  Patient expressed concerns that this line is contaminated as she states that the home health RN attempted to reinsert part of the line.  After discussing with MD, it was felt that the decision to replace versus exchange PICC would need to be made in the morning and this is why line was not removed. Tykira Wachs, Lajean Manes

## 2012-09-23 ENCOUNTER — Emergency Department (HOSPITAL_COMMUNITY): Payer: Medicaid Other

## 2012-09-23 LAB — GLUCOSE, CAPILLARY: Glucose-Capillary: 95 mg/dL (ref 70–99)

## 2012-09-23 MED ORDER — HYDROMORPHONE HCL PF 1 MG/ML IJ SOLN
2.0000 mg | Freq: Once | INTRAMUSCULAR | Status: AC
  Administered 2012-09-23: 2 mg via INTRAVENOUS
  Filled 2012-09-23: qty 2

## 2012-09-23 MED ORDER — HYDROMORPHONE HCL PF 2 MG/ML IJ SOLN
INTRAMUSCULAR | Status: AC
  Administered 2012-09-23: 2 mg via INTRAVENOUS
  Filled 2012-09-23: qty 1

## 2012-09-23 MED ORDER — HYDROMORPHONE HCL PF 1 MG/ML IJ SOLN: 2.0000 mg | Freq: Once | INTRAMUSCULAR | Status: AC

## 2012-09-23 MED ORDER — HYDROMORPHONE HCL PF 1 MG/ML IJ SOLN
1.0000 mg | Freq: Once | INTRAMUSCULAR | Status: AC
  Administered 2012-09-23: 1 mg via INTRAVENOUS
  Filled 2012-09-23: qty 1

## 2012-09-23 NOTE — ED Notes (Signed)
Patient ambulated to bathroom, requests additional pain medications.  ERMD aware.  Patient has signed consent form for PICC placement.  IR to call with schedule this morning.  Patient has inquired if we are giving vanc dose this morning.  If we do, we are to give zofran due to nausea.

## 2012-09-23 NOTE — ED Notes (Signed)
Patient now awake and requesting additional pain medications.  Will inform MD

## 2012-09-23 NOTE — Procedures (Signed)
Successful exchange of existing right upper extremity approach 37 cm single lumen PICC line with tip at the superior caval-atrial junction.  The PICC line is ready for immediate use.

## 2012-09-23 NOTE — Progress Notes (Signed)
Pt c/o pain along edges of dressing on right lateral side of PICC drsg and just below it approximately 3 inches above the ante cubital area.  Appears to be irritation from previous drsg or the drape from where the new PICC was inserted.  Instructed to not put lotions near the drsg to prevent infection of the PICC.  Primary RN was in room also.  Pt. Verbalized understanding.

## 2012-09-24 DIAGNOSIS — N951 Menopausal and female climacteric states: Secondary | ICD-10-CM | POA: Insufficient documentation

## 2012-09-24 DIAGNOSIS — Z780 Asymptomatic menopausal state: Secondary | ICD-10-CM | POA: Insufficient documentation

## 2012-09-24 MED FILL — Chlorhexidine Gluconate Liquid 4%: CUTANEOUS | Qty: 30 | Status: AC

## 2012-10-28 ENCOUNTER — Encounter: Payer: Self-pay | Admitting: Neurosurgery

## 2012-10-28 ENCOUNTER — Ambulatory Visit: Payer: Self-pay | Admitting: Neurosurgery

## 2012-11-10 ENCOUNTER — Encounter: Payer: Self-pay | Admitting: Neurosurgery

## 2012-11-27 ENCOUNTER — Ambulatory Visit: Payer: Self-pay | Admitting: Internal Medicine

## 2012-12-17 ENCOUNTER — Ambulatory Visit: Payer: Self-pay | Admitting: Hematology and Oncology

## 2012-12-18 ENCOUNTER — Ambulatory Visit: Payer: Self-pay | Admitting: Hematology and Oncology

## 2012-12-18 LAB — CBC CANCER CENTER
Basophil #: 0.1 x10 3/mm (ref 0.0–0.1)
Basophil %: 0.6 %
Eosinophil #: 0.2 x10 3/mm (ref 0.0–0.7)
Eosinophil %: 2.2 %
HCT: 35.6 % (ref 35.0–47.0)
HGB: 11.9 g/dL — ABNORMAL LOW (ref 12.0–16.0)
Lymphocyte #: 1.5 x10 3/mm (ref 1.0–3.6)
Lymphocyte %: 18.7 %
MCH: 26 pg (ref 26.0–34.0)
MCHC: 33.4 g/dL (ref 32.0–36.0)
MCV: 78 fL — ABNORMAL LOW (ref 80–100)
Monocyte #: 0.4 x10 3/mm (ref 0.2–0.9)
Monocyte %: 5.1 %
Neutrophil #: 5.9 x10 3/mm (ref 1.4–6.5)
Neutrophil %: 73.4 %
Platelet: 319 x10 3/mm (ref 150–440)
RBC: 4.58 10*6/uL (ref 3.80–5.20)
RDW: 17.8 % — ABNORMAL HIGH (ref 11.5–14.5)
WBC: 8 x10 3/mm (ref 3.6–11.0)

## 2012-12-23 ENCOUNTER — Ambulatory Visit: Payer: Self-pay | Admitting: Internal Medicine

## 2013-01-10 ENCOUNTER — Ambulatory Visit: Payer: Self-pay | Admitting: Hematology and Oncology

## 2013-01-15 ENCOUNTER — Ambulatory Visit: Payer: Self-pay | Admitting: Hematology and Oncology

## 2013-01-15 LAB — IRON AND TIBC
Iron Bind.Cap.(Total): 336 ug/dL (ref 250–450)
Iron Saturation: 22 %
Iron: 75 ug/dL (ref 50–170)
Unbound Iron-Bind.Cap.: 261 ug/dL

## 2013-01-15 LAB — CBC CANCER CENTER
Basophil #: 0.1 x10 3/mm (ref 0.0–0.1)
Basophil %: 1.1 %
Eosinophil #: 0.4 x10 3/mm (ref 0.0–0.7)
Eosinophil %: 4.4 %
HCT: 35.8 % (ref 35.0–47.0)
HGB: 11.8 g/dL — ABNORMAL LOW (ref 12.0–16.0)
Lymphocyte #: 2.5 x10 3/mm (ref 1.0–3.6)
Lymphocyte %: 30.9 %
MCH: 27.5 pg (ref 26.0–34.0)
MCHC: 33.1 g/dL (ref 32.0–36.0)
MCV: 83 fL (ref 80–100)
Monocyte #: 0.6 x10 3/mm (ref 0.2–0.9)
Monocyte %: 7.1 %
Neutrophil #: 4.6 x10 3/mm (ref 1.4–6.5)
Neutrophil %: 56.5 %
Platelet: 219 x10 3/mm (ref 150–440)
RBC: 4.3 10*6/uL (ref 3.80–5.20)
RDW: 19.4 % — ABNORMAL HIGH (ref 11.5–14.5)
WBC: 8.2 x10 3/mm (ref 3.6–11.0)

## 2013-01-15 LAB — FERRITIN: Ferritin (ARMC): 255 ng/mL (ref 8–388)

## 2013-01-15 LAB — RETICULOCYTES
Absolute Retic Count: 0.0917 10*6/uL (ref 0.019–0.186)
Reticulocyte: 2.13 % (ref 0.4–3.1)

## 2013-01-16 ENCOUNTER — Ambulatory Visit: Payer: Self-pay | Admitting: Internal Medicine

## 2013-02-02 ENCOUNTER — Ambulatory Visit: Payer: Self-pay | Admitting: Internal Medicine

## 2013-02-02 DIAGNOSIS — D0502 Lobular carcinoma in situ of left breast: Secondary | ICD-10-CM

## 2013-02-02 HISTORY — DX: Lobular carcinoma in situ of left breast: D05.02

## 2013-02-09 ENCOUNTER — Ambulatory Visit: Payer: Self-pay | Admitting: Hematology and Oncology

## 2013-02-10 ENCOUNTER — Ambulatory Visit: Payer: Self-pay | Admitting: Gastroenterology

## 2013-02-10 LAB — PATHOLOGY REPORT

## 2013-02-11 ENCOUNTER — Ambulatory Visit: Payer: Self-pay | Admitting: Hematology and Oncology

## 2013-02-12 LAB — PATHOLOGY REPORT

## 2013-02-16 ENCOUNTER — Ambulatory Visit (INDEPENDENT_AMBULATORY_CARE_PROVIDER_SITE_OTHER): Payer: Medicaid Other | Admitting: General Surgery

## 2013-02-16 ENCOUNTER — Ambulatory Visit: Payer: Medicaid Other

## 2013-02-16 ENCOUNTER — Encounter: Payer: Self-pay | Admitting: General Surgery

## 2013-02-16 VITALS — BP 130/68 | HR 78 | Resp 14 | Ht 68.0 in | Wt 185.0 lb

## 2013-02-16 DIAGNOSIS — D0590 Unspecified type of carcinoma in situ of unspecified breast: Secondary | ICD-10-CM

## 2013-02-16 DIAGNOSIS — D0500 Lobular carcinoma in situ of unspecified breast: Secondary | ICD-10-CM | POA: Insufficient documentation

## 2013-02-16 DIAGNOSIS — D0502 Lobular carcinoma in situ of left breast: Secondary | ICD-10-CM

## 2013-02-16 NOTE — Patient Instructions (Addendum)
Patient to be scheduled for outpatient surgery for left breast lumpectomy.   Patient's surgery has been scheduled for 02-25-13 at [facility]. It is okay for patient to continue 81 mg aspirin.

## 2013-02-16 NOTE — Progress Notes (Signed)
Patient ID: Holly Espinoza, female   DOB: Mar 01, 1961, 52 y.o.   MRN: [id number]  Chief Complaint  Patient presents with  . Other    New patient diagnosis of lobular carcinoma    HPI Holly Espinoza is a 52 y.o. female who presents for a breast evaluation. The most recent mammogram was done on 01/16/13 as well as a left breast ultrasound at that time. She also had a stereotactic left breast biopsy on 02/02/13 that showed lobular carcinoma in Situ.  Patient does perform regular self breast checks but does not get regular mammograms done. Only known family history of breast problems is a great grandmother who had breast cancer. She denies any problems with the breasts prior to mammogram and biopsy. No prior breast problem in the past. Patient is disabled due to her back.   HPI  Past Medical History  Diagnosis Date  . Neuromuscular disorder   . Depression   . High cholesterol   . Type II diabetes mellitus   . Diabetic peripheral neuropathy   . B12 deficiency anemia   . Arthritis     "pretty much from my neck to my feet" (07/08/2012)  . Chronic lower back pain   . Cancer 2014    left breast lobular carcinoma in situ    Past Surgical History  Procedure Laterality Date  . Anterior cervical decomp/discectomy fusion  2012  . Abdominal hysterectomy  2001?  Marland Kitchen Cholecystectomy  1980's  . Posterior lumbar fusion  07/08/2012  . Tonsillectomy  ~ 1968  . Dilation and curettage of uterus  [phone]    "probably 3" (07/08/2012)  . Back surgery  2001    laminectomy  . Fracture surgery      C4-7 Surgery for fracture  . Lumbar wound debridement N/A 08/12/2012    Procedure: LUMBAR WOUND DEBRIDEMENT;  Surgeon: Reinaldo Meeker, MD;  Location: [facility];  Service: Neurosurgery;  Laterality: N/A;  Lumbar Wound Debridement  . Breast biopsy Left 2014    Family History  Problem Relation Age of Onset  . Cancer Other     breast great grandmother    Social History History  Substance Use Topics  .  Smoking status: Current Every Day Smoker -- 0.50 packs/day for 40 years    Types: Cigarettes  . Smokeless tobacco: Never Used  . Alcohol Use: No    No Known Allergies  Current Outpatient Prescriptions  Medication Sig Dispense Refill  . aspirin 81 MG tablet Take 81 mg by mouth daily.      Marland Kitchen atorvastatin (LIPITOR) 40 MG tablet Take 40 mg by mouth daily.      . cyanocobalamin (,VITAMIN B-12,) 1000 MCG/ML injection Inject 1,000 mcg into the muscle every 30 (thirty) days. On the 1st of every month      . diazepam (VALIUM) 5 MG tablet Take 5 mg by mouth 4 (four) times daily as needed for anxiety. For muscle spasms      . DULoxetine (CYMBALTA) 60 MG capsule Take 60 mg by mouth daily.      . furosemide (LASIX) 20 MG tablet Take 20 mg by mouth daily.      Marland Kitchen gabapentin (NEURONTIN) 300 MG capsule Take 300 mg by mouth 3 (three) times daily.      Marland Kitchen ibuprofen (ADVIL,MOTRIN) 800 MG tablet Take 800 mg by mouth 3 (three) times daily.      Marland Kitchen levothyroxine (SYNTHROID, LEVOTHROID) 50 MCG tablet Take 50 mcg by mouth daily before breakfast.      .  metFORMIN (GLUCOPHAGE) 1000 MG tablet Take 1,000 mg by mouth 2 (two) times daily with a meal.      . ondansetron (ZOFRAN) 4 MG tablet Take 4 mg by mouth every 8 (eight) hours as needed for nausea. For nausea      . oxyCODONE (ROXICODONE) 15 MG immediate release tablet Take 1 tablet (15 mg total) by mouth every 4 (four) hours as needed for pain.  75 tablet  0   No current facility-administered medications for this visit.    Review of Systems Review of Systems  Constitutional: Negative.   Respiratory: Negative.   Cardiovascular: Negative.     Blood pressure 130/68, pulse 78, resp. rate 14, height 5\' 8"  (1.727 m), weight 185 lb (83.915 kg).  Physical Exam Physical Exam  Constitutional: She is oriented to person, place, and time. She appears well-developed and well-nourished.  Eyes: Conjunctivae are normal. No scleral icterus.  Neck: No thyromegaly present.   Cardiovascular: Normal rate, regular rhythm and normal heart sounds.   No murmur heard. Pulmonary/Chest: Effort normal and breath sounds normal. Right breast exhibits no inverted nipple, no mass, no nipple discharge, no skin change and no tenderness. Left breast exhibits no inverted nipple, no mass, no nipple discharge, no skin change and no tenderness.  Left breast in uoq area has mild thickening at recent biopsy site  Lymphadenopathy:    She has no cervical adenopathy.    She has no axillary adenopathy.  Neurological: She is alert and oriented to person, place, and time.  Skin: Skin is warm and dry.    Data Reviewed Mammogram, Ultrasound, and Biopsy reports reviewed. US done today shows the clip at 1.30 ocl location near left breast periphery   Assessment    Left breast lobular carcinoma in situ.     Plan    Patient to be scheduled for outpatient surgery for left breast lumpectomy. Procedure, reasons,risks and benefits explained. Pt is agreeable.    Patient's surgery has been scheduled for 02-25-13 at [facility]. It is okay for patient to continue 81 mg aspirin.   SANKAR,SEEPLAPUTHUR G 02/16/2013, 4:00 PM

## 2013-02-17 LAB — BASIC METABOLIC PANEL
BUN: 23
CO2: 32 mmol/L — AB (ref 13–22)
Calcium: 9.9 mg/dL (ref 8.7–10.7)
Chloride: 96 mmol/L — AB (ref 99–108)
Creatinine: 1.8 mg/dL — AB (ref 0.5–1.1)
Glucose: 147 mg/dL
Potassium: 3.2 mmol/L — AB (ref 3.4–5.3)
Sodium: 134 mmol/L — AB (ref 137–147)

## 2013-02-23 ENCOUNTER — Ambulatory Visit: Payer: Self-pay | Admitting: Anesthesiology

## 2013-02-23 LAB — BASIC METABOLIC PANEL
Anion Gap: 6 — ABNORMAL LOW (ref 7–16)
BUN: 23 mg/dL — ABNORMAL HIGH (ref 7–18)
Calcium, Total: 9.9 mg/dL (ref 8.5–10.1)
Chloride: 96 mmol/L — ABNORMAL LOW (ref 98–107)
Co2: 32 mmol/L (ref 21–32)
Creatinine: 1.82 mg/dL — ABNORMAL HIGH (ref 0.60–1.30)
EGFR (African American): 36 — ABNORMAL LOW
EGFR (Non-African Amer.): 31 — ABNORMAL LOW
Glucose: 147 mg/dL — ABNORMAL HIGH (ref 65–99)
Osmolality: 275 (ref 275–301)
Potassium: 3.2 mmol/L — ABNORMAL LOW (ref 3.5–5.1)
Sodium: 134 mmol/L — ABNORMAL LOW (ref 136–145)

## 2013-02-24 ENCOUNTER — Telehealth: Payer: Self-pay | Admitting: *Deleted

## 2013-02-24 MED ORDER — POTASSIUM CHLORIDE CRYS ER 20 MEQ PO TBCR: 20.0000 meq | EXTENDED_RELEASE_TABLET | Freq: Two times a day (BID) | ORAL | Status: DC

## 2013-02-24 NOTE — Telephone Encounter (Signed)
Left message regarding prescription.

## 2013-02-24 NOTE — Telephone Encounter (Signed)
Message copied by Currie Paris on Tue Feb 24, 2013  9:14 AM ------      Message from: Kieth Brightly      Created: Tue Feb 24, 2013  8:57 AM       Call in [facility] , # 5 one po bid -start this am ------

## 2013-02-25 ENCOUNTER — Ambulatory Visit: Payer: Self-pay | Admitting: General Surgery

## 2013-02-25 ENCOUNTER — Encounter: Payer: Self-pay | Admitting: General Surgery

## 2013-02-25 DIAGNOSIS — D0590 Unspecified type of carcinoma in situ of unspecified breast: Secondary | ICD-10-CM

## 2013-02-25 HISTORY — PX: BREAST LUMPECTOMY: SHX2

## 2013-02-25 HISTORY — PX: BREAST SURGERY: SHX581

## 2013-02-25 LAB — POTASSIUM: Potassium: 3.9 mmol/L (ref 3.5–5.1)

## 2013-02-26 ENCOUNTER — Encounter: Payer: Self-pay | Admitting: General Surgery

## 2013-02-27 LAB — PATHOLOGY REPORT

## 2013-02-28 ENCOUNTER — Emergency Department: Payer: Self-pay | Admitting: Emergency Medicine

## 2013-03-02 ENCOUNTER — Encounter: Payer: Self-pay | Admitting: General Surgery

## 2013-03-03 ENCOUNTER — Telehealth: Payer: Self-pay | Admitting: *Deleted

## 2013-03-03 NOTE — Telephone Encounter (Signed)
Notified patient as instructed, patient pleased. Discussed follow-up appointments, patient agrees  

## 2013-03-03 NOTE — Telephone Encounter (Signed)
Message copied by Currie Paris on Tue Mar 03, 2013  3:31 PM ------      Message from: Kieth Brightly      Created: Tue Mar 03, 2013  3:03 PM       Please let pt pt know the pathology showed only LCIS, no invasive cancer. F/u as scheduled. ------

## 2013-03-10 ENCOUNTER — Ambulatory Visit (INDEPENDENT_AMBULATORY_CARE_PROVIDER_SITE_OTHER): Payer: Medicaid Other | Admitting: General Surgery

## 2013-03-10 ENCOUNTER — Encounter: Payer: Self-pay | Admitting: General Surgery

## 2013-03-10 VITALS — BP 126/76 | HR 68 | Resp 12 | Ht 68.0 in | Wt 176.0 lb

## 2013-03-10 DIAGNOSIS — D0590 Unspecified type of carcinoma in situ of unspecified breast: Secondary | ICD-10-CM

## 2013-03-10 DIAGNOSIS — D0502 Lobular carcinoma in situ of left breast: Secondary | ICD-10-CM

## 2013-03-10 NOTE — Progress Notes (Signed)
This is a 52 year old female here today for her post op left lumpectomy done on 02/25/13. Patient states she is doing okay. Patient states she went to the ER on 02/27/13 and talked with Dr. Katrinka Blazing and someone at the ER  put sutures in.  Incision looks clean, Removed sutures. Patient to return in 11 months after diagnotic bilateral mammogram.  Path showed small focus of residual LCIS. No invasive cancer and margins clear.

## 2013-03-10 NOTE — Patient Instructions (Signed)
Patient to return in 11 months after diagnotic bilateral mammogram.

## 2013-03-11 ENCOUNTER — Encounter: Payer: Self-pay | Admitting: General Surgery

## 2013-10-19 ENCOUNTER — Emergency Department: Payer: Self-pay | Admitting: Emergency Medicine

## 2013-12-14 ENCOUNTER — Ambulatory Visit: Payer: Self-pay | Admitting: Physician Assistant

## 2013-12-17 ENCOUNTER — Ambulatory Visit: Payer: Self-pay | Admitting: Anesthesiology

## 2014-01-11 ENCOUNTER — Encounter: Payer: Self-pay | Admitting: General Surgery

## 2014-01-28 ENCOUNTER — Ambulatory Visit: Payer: Medicaid Other | Admitting: General Surgery

## 2014-02-16 ENCOUNTER — Encounter: Payer: Self-pay | Admitting: General Surgery

## 2014-02-24 ENCOUNTER — Ambulatory Visit: Payer: Medicaid Other | Admitting: General Surgery

## 2014-03-17 ENCOUNTER — Emergency Department: Payer: Self-pay | Admitting: Emergency Medicine

## 2014-06-21 ENCOUNTER — Ambulatory Visit
Admit: 2014-06-21 | Disposition: A | Discharge status: Home / Self Care | Payer: Self-pay | Attending: Nurse Practitioner | Admitting: Nurse Practitioner

## 2014-06-28 ENCOUNTER — Encounter: Payer: Self-pay | Admitting: General Surgery

## 2014-06-28 ENCOUNTER — Ambulatory Visit (INDEPENDENT_AMBULATORY_CARE_PROVIDER_SITE_OTHER): Payer: Medicaid Other | Admitting: General Surgery

## 2014-06-28 VITALS — BP 110/60 | HR 72 | Resp 14 | Ht 68.0 in | Wt 188.0 lb

## 2014-06-28 DIAGNOSIS — D0502 Lobular carcinoma in situ of left breast: Secondary | ICD-10-CM

## 2014-06-28 NOTE — Progress Notes (Signed)
Patient ID: Holly Espinoza, female   DOB: 01-Jan-1961, 54 y.o.   MRN: [id number]  Chief Complaint  Patient presents with  . Follow-up    mammogram    HPI Holly Espinoza is a 54 y.o. female who presents for a breast cancer follow up. The most recent mammogram was done on 02/16/14.  Patient does perform regular self breast checks and gets regular mammograms done.    HPI  Past Medical History  Diagnosis Date  . Neuromuscular disorder   . Depression   . High cholesterol   . Type II diabetes mellitus   . Diabetic peripheral neuropathy   . B12 deficiency anemia   . Arthritis     "pretty much from my neck to my feet" (07/08/2012)  . Chronic lower back pain   . Cancer 2014    left breast lobular carcinoma in situ    Past Surgical History  Procedure Laterality Date  . Anterior cervical decomp/discectomy fusion  2012  . Abdominal hysterectomy  2001?  Marland Kitchen Cholecystectomy  1980's  . Posterior lumbar fusion  07/08/2012  . Tonsillectomy  ~ 1968  . Dilation and curettage of uterus  [phone]    "probably 3" (07/08/2012)  . Back surgery  2001    laminectomy  . Fracture surgery      C4-7 Surgery for fracture  . Lumbar wound debridement N/A 08/12/2012    Procedure: LUMBAR WOUND DEBRIDEMENT;  Surgeon: Faythe Ghee, MD;  Location: [place] [facility];  Service: Neurosurgery;  Laterality: N/A;  Lumbar Wound Debridement  . Breast biopsy Left 2014  . Breast surgery Left 02/25/13    lumpectomy    Family History  Problem Relation Age of Onset  . Cancer Other     breast great grandmother    Social History History  Substance Use Topics  . Smoking status: Current Every Day Smoker -- 0.50 packs/day for 40 years    Types: Cigarettes  . Smokeless tobacco: Never Used  . Alcohol Use: No    No Known Allergies  Current Outpatient Prescriptions  Medication Sig Dispense Refill  . aspirin 81 MG tablet Take 81 mg by mouth daily.    Marland Kitchen atorvastatin (LIPITOR) 40 MG tablet Take 40 mg by mouth daily.     . cyanocobalamin (,VITAMIN B-12,) 1000 MCG/ML injection Inject 1,000 mcg into the muscle every 30 (thirty) days. On the 1st of every month    . diazepam (VALIUM) 5 MG tablet Take 5 mg by mouth 4 (four) times daily as needed for anxiety. For muscle spasms    . DULoxetine (CYMBALTA) 60 MG capsule Take 60 mg by mouth daily.    . furosemide (LASIX) 20 MG tablet Take 20 mg by mouth daily.    Marland Kitchen gabapentin (NEURONTIN) 300 MG capsule Take 300 mg by mouth 3 (three) times daily.    Marland Kitchen ibuprofen (ADVIL,MOTRIN) 800 MG tablet Take 800 mg by mouth 3 (three) times daily.    Marland Kitchen levothyroxine (SYNTHROID, LEVOTHROID) 50 MCG tablet Take 50 mcg by mouth daily before breakfast.    . metFORMIN (GLUCOPHAGE) 1000 MG tablet Take 1,000 mg by mouth 2 (two) times daily with a meal.    . ondansetron (ZOFRAN) 4 MG tablet Take 4 mg by mouth every 8 (eight) hours as needed for nausea. For nausea    . oxyCODONE (ROXICODONE) 15 MG immediate release tablet Take 1 tablet (15 mg total) by mouth every 4 (four) hours as needed for pain. 75 tablet 0  . potassium  chloride SA (K-DUR,KLOR-CON) 20 MEQ tablet Take 1 tablet (20 mEq total) by mouth 2 (two) times daily. 5 tablet 0   No current facility-administered medications for this visit.    Review of Systems Review of Systems  Constitutional: Negative.   Respiratory: Negative.   Cardiovascular: Negative.     Blood pressure 110/60, pulse 72, resp. rate 14, height 5\' 8"  (1.727 m), weight 188 lb (85.276 kg).  Physical Exam Physical Exam  Constitutional: She is oriented to person, place, and time. She appears well-developed and well-nourished.  Eyes: Conjunctivae are normal. No scleral icterus.  Neck: Neck supple.  Cardiovascular: Normal rate, regular rhythm and normal heart sounds.   Pulmonary/Chest: Effort normal and breath sounds normal. Right breast exhibits no inverted nipple, no mass, no nipple discharge, no skin change and no tenderness. Left breast exhibits no inverted  nipple, no mass, no nipple discharge, no skin change and no tenderness.  Left breast incision is clean and well healed.   Abdominal: Soft. Bowel sounds are normal. There is no tenderness.  Lymphadenopathy:    She has no cervical adenopathy.  Neurological: She is alert and oriented to person, place, and time.  Skin: Skin is warm and dry.    Data Reviewed Mammogram reviewed and stable  Assessment    LCIS left breast, s/p lumpectomy. Stable exam.    Plan   Patient will be asked to return to the office in eight month with a bilateral screening mammogram.       PCP:  Leroy Sea G 06/29/2014, 6:25 AM

## 2014-06-28 NOTE — Patient Instructions (Signed)
Patient will be asked to return to the office in eight month with a bilateral screening mammogram.

## 2014-06-29 ENCOUNTER — Encounter: Payer: Self-pay | Admitting: General Surgery

## 2014-07-02 NOTE — Op Note (Signed)
PATIENT NAME:  Holly Espinoza, Holly Espinoza MR#:  [id number] DATE OF BIRTH:  1960/10/03  DATE OF PROCEDURE:  02/25/2013  PREOPERATIVE DIAGNOSIS: Lobular carcinoma in situ, left breast.  POSTOPERATIVE DIAGNOSIS: Lobular carcinoma in situ, left breast.  OPERATION: Left breast lumpectomy with ultrasound guidance and wire localization.  ANESTHESIA: General.   COMPLICATIONS: None.   ESTIMATED BLOOD LOSS: Minimal.   DRAINS: None.   DESCRIPTION OF PROCEDURE: The patient was placed in the supine position on the operating table and after satisfactory anesthesia obtained with LMA, the left breast was prepped and draped out as a sterile field. Timeout was performed. Ultrasound probe was brought up to the field and the location of the clip at the 1 o'clock to 2 o'clock position in the periphery of the breast where she had the recent stereo biopsy was performed. The clip was identified and using this as a guide, a Bard ultra wire was positioned going towards this clip. After this was done, an incision was made along the periphery of the breast from the wire entrance site, curved around towards the 1 o'clock location, and carried down through the subcutaneous tissue, and the skin and subcutaneous tissue was elevated on both sides.   Using the wire as a guide, the biopsy cavity was identified and subsequently it was excised out in full. The excised tissue was tagged for margins and sent to pathology for inking. There did not appear to be any evidence of a gross mass although there was a firm area within the excised tissue, likely from either recent biopsy. The surrounding breast tissue appeared normal. Bleeding was controlled with cautery. The deep layers were closed with interrupted 2-0 Vicryl stitches and the skin closed with subcuticular 4-0 Vicryl, covered with Dermabond. The procedure was well tolerated and she was subsequently returned to the recovery room in stable condition.  ____________________________ S.Robinette Haines, MD sgs:np D: 02/25/2013 13:48:51 ET T: 02/25/2013 19:57:49 ET JOB#: [id number]  cc: Synthia Innocent. Jamal Collin, MD, <Dictator> [facility] Robinette Haines MD ELECTRONICALLY SIGNED 02/26/2013 6:38

## 2014-10-28 ENCOUNTER — Ambulatory Visit
Admission: RE | Admit: 2014-10-28 | Discharge: 2014-10-28 | Disposition: A | Discharge status: Home / Self Care | Payer: Medicaid Other | Source: Ambulatory Visit | Attending: Nurse Practitioner | Admitting: Nurse Practitioner

## 2014-10-28 ENCOUNTER — Other Ambulatory Visit: Payer: Self-pay | Admitting: Nurse Practitioner

## 2014-10-28 DIAGNOSIS — M4682 Other specified inflammatory spondylopathies, cervical region: Secondary | ICD-10-CM | POA: Diagnosis not present

## 2014-10-28 DIAGNOSIS — R52 Pain, unspecified: Secondary | ICD-10-CM

## 2014-12-27 ENCOUNTER — Emergency Department: Payer: Medicaid Other

## 2014-12-27 ENCOUNTER — Encounter: Payer: Self-pay | Admitting: Emergency Medicine

## 2014-12-27 ENCOUNTER — Emergency Department
Admission: EM | Admit: 2014-12-27 | Discharge: 2014-12-28 | Disposition: A | Discharge status: Home / Self Care | Payer: Medicaid Other | Attending: Emergency Medicine | Admitting: Emergency Medicine

## 2014-12-27 DIAGNOSIS — G8929 Other chronic pain: Secondary | ICD-10-CM | POA: Insufficient documentation

## 2014-12-27 DIAGNOSIS — Y92410 Unspecified street and highway as the place of occurrence of the external cause: Secondary | ICD-10-CM | POA: Insufficient documentation

## 2014-12-27 DIAGNOSIS — Z7984 Long term (current) use of oral hypoglycemic drugs: Secondary | ICD-10-CM | POA: Insufficient documentation

## 2014-12-27 DIAGNOSIS — Z79899 Other long term (current) drug therapy: Secondary | ICD-10-CM | POA: Insufficient documentation

## 2014-12-27 DIAGNOSIS — Y998 Other external cause status: Secondary | ICD-10-CM | POA: Diagnosis not present

## 2014-12-27 DIAGNOSIS — Z720 Tobacco use: Secondary | ICD-10-CM | POA: Insufficient documentation

## 2014-12-27 DIAGNOSIS — Z791 Long term (current) use of non-steroidal anti-inflammatories (NSAID): Secondary | ICD-10-CM | POA: Diagnosis not present

## 2014-12-27 DIAGNOSIS — T148XXA Other injury of unspecified body region, initial encounter: Secondary | ICD-10-CM

## 2014-12-27 DIAGNOSIS — S3992XA Unspecified injury of lower back, initial encounter: Secondary | ICD-10-CM | POA: Diagnosis not present

## 2014-12-27 DIAGNOSIS — S199XXA Unspecified injury of neck, initial encounter: Secondary | ICD-10-CM | POA: Insufficient documentation

## 2014-12-27 DIAGNOSIS — E1140 Type 2 diabetes mellitus with diabetic neuropathy, unspecified: Secondary | ICD-10-CM | POA: Insufficient documentation

## 2014-12-27 DIAGNOSIS — T148 Other injury of unspecified body region: Secondary | ICD-10-CM | POA: Insufficient documentation

## 2014-12-27 DIAGNOSIS — Y9389 Activity, other specified: Secondary | ICD-10-CM | POA: Insufficient documentation

## 2014-12-27 DIAGNOSIS — Z7982 Long term (current) use of aspirin: Secondary | ICD-10-CM | POA: Diagnosis not present

## 2014-12-27 DIAGNOSIS — S0990XA Unspecified injury of head, initial encounter: Secondary | ICD-10-CM | POA: Diagnosis present

## 2014-12-27 MED ORDER — TRAMADOL HCL 50 MG PO TABS: 50.0000 mg | ORAL_TABLET | Freq: Four times a day (QID) | ORAL | Status: DC | PRN

## 2014-12-27 NOTE — ED Notes (Signed)
Pt in C collar and back board PTA.
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
## 2014-12-27 NOTE — Discharge Instructions (Signed)
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
## 2014-12-27 NOTE — ED Notes (Signed)
Patient transported to CT via stretcher.

## 2014-12-28 MED ORDER — TRAMADOL HCL 50 MG PO TABS
100.0000 mg | ORAL_TABLET | Freq: Once | ORAL | Status: AC
  Administered 2014-12-28: 100 mg via ORAL
  Filled 2014-12-28: qty 2

## 2015-02-05 IMAGING — RF DG MYELOGRAM LUMBAR
15 of 16 series · 15 of 16 positions shown · IV contrast (omnipaque)
Comparison: none

CLINICAL DATA: Spinal stenosis

MYELOGRAM LUMBAR
TECHNIQUE: The procedure, risks, benefits, and alternatives were
explained to the patient. The patient understands and consents.
Under fluoroscopic guidance, a 22 gauge spinal needle was placed in
the CSF space via right L5-S1 approach. 20 mL of Omnipaque 180 was
injected.

[Series 1: (hospital) · 1 of 1 slices shown (1 of 2)]
[im 1/1]
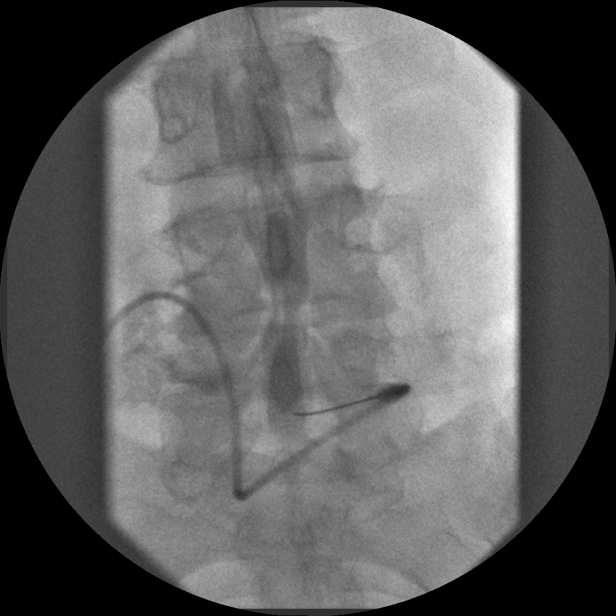

[Series 2: (hospital) · 1 of 1 slices shown (2 of 2)]
[im 1/1]
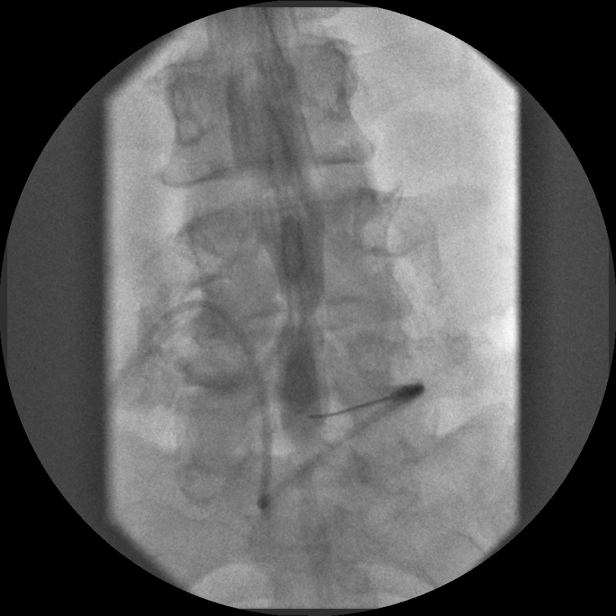

[Series 3: myelogram  white · 1 of 1 slices shown (1 of 10)]
[im 1/1]
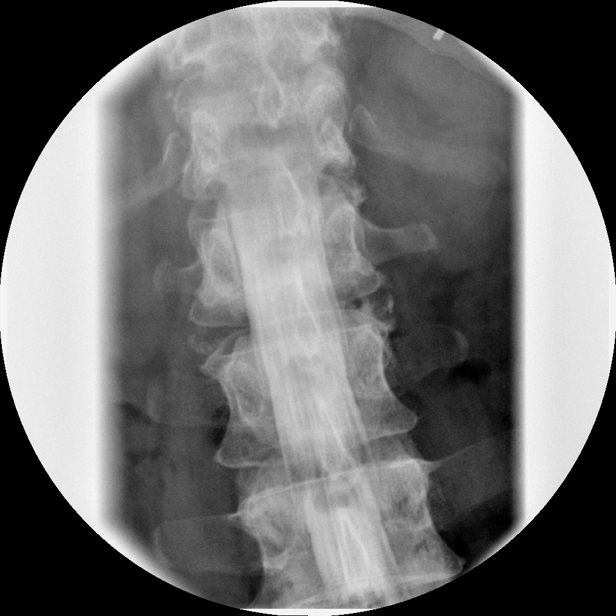

[Series 4: myelogram  white · 1 of 1 slices shown (2 of 10)]
[im 1/1]
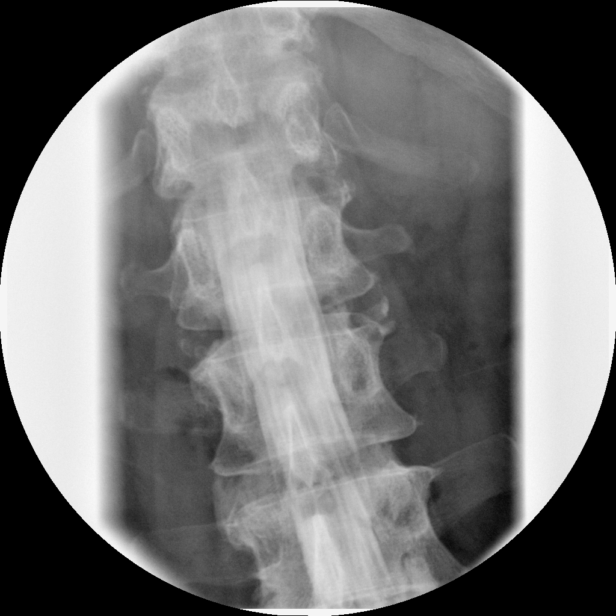

[Series 5: myelogram  white · 1 of 1 slices shown (3 of 10)]
[im 1/1]
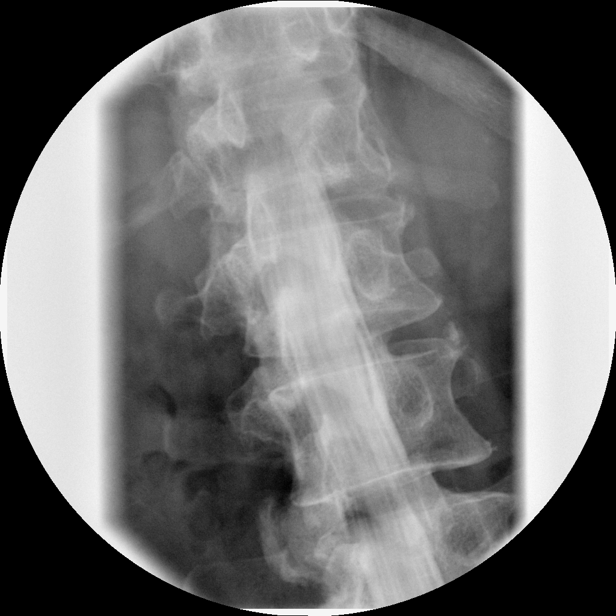

[Series 6: myelogram  white · 1 of 1 slices shown (4 of 10)]
[im 1/1]
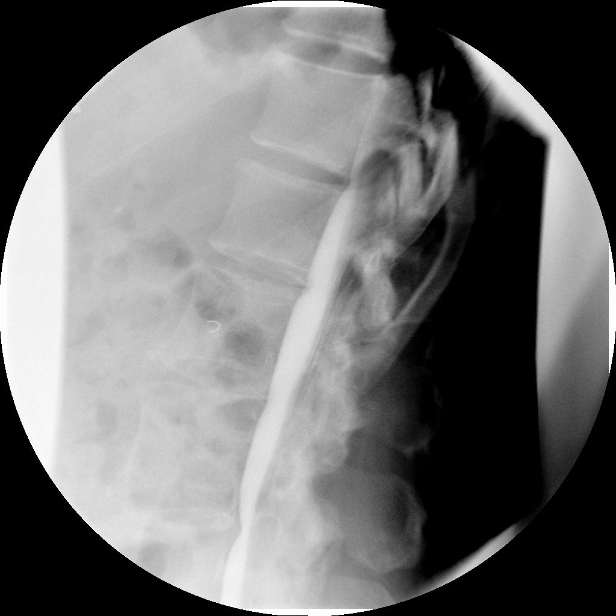

[Series 7: myelogram  white · 1 of 1 slices shown (5 of 10)]
[im 1/1]
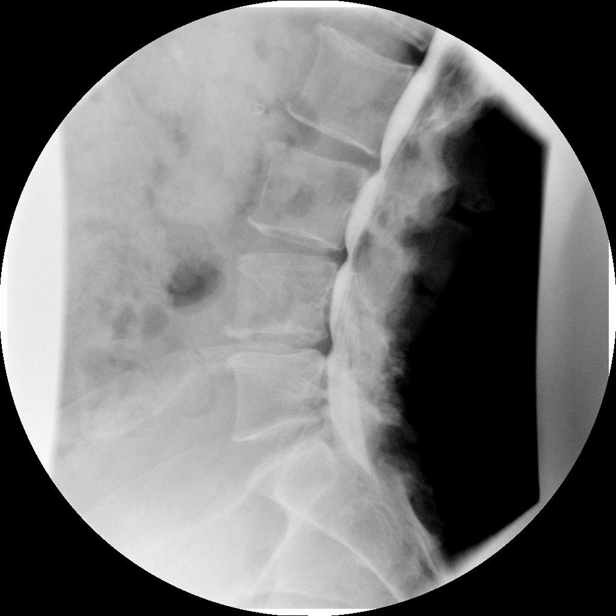

[Series 9: myelogram  white · 1 of 1 slices shown (6 of 10)]
[im 1/1]
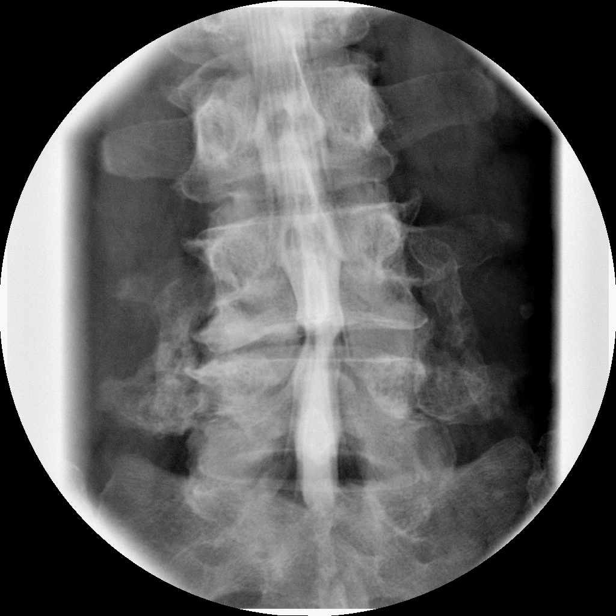

[Series 10: myelogram  white · 1 of 1 slices shown (7 of 10)]
[im 1/1]
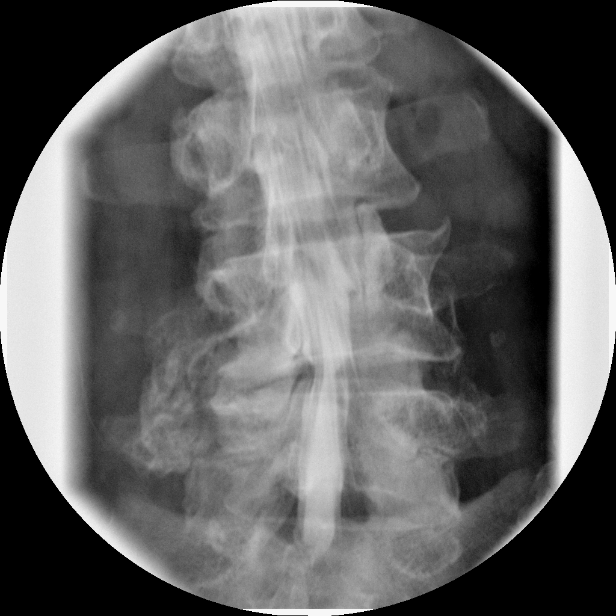

[Series 11: myelogram  white · 1 of 1 slices shown (8 of 10)]
[im 1/1]
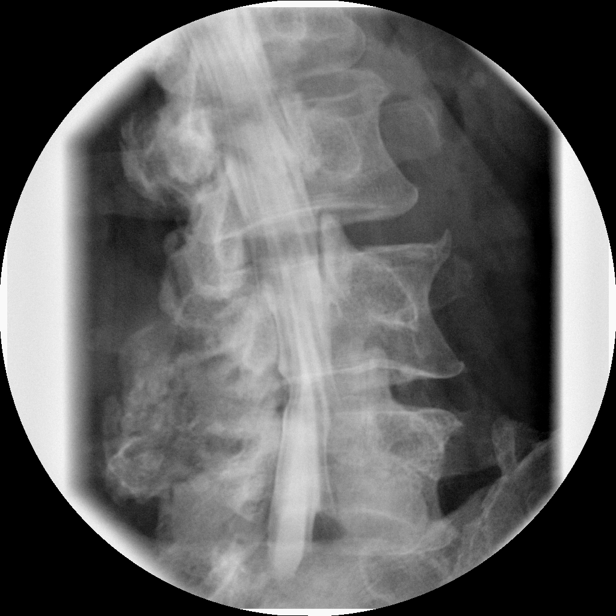

[Series 12: myelogram  white · 1 of 1 slices shown (9 of 10)]
[im 1/1]
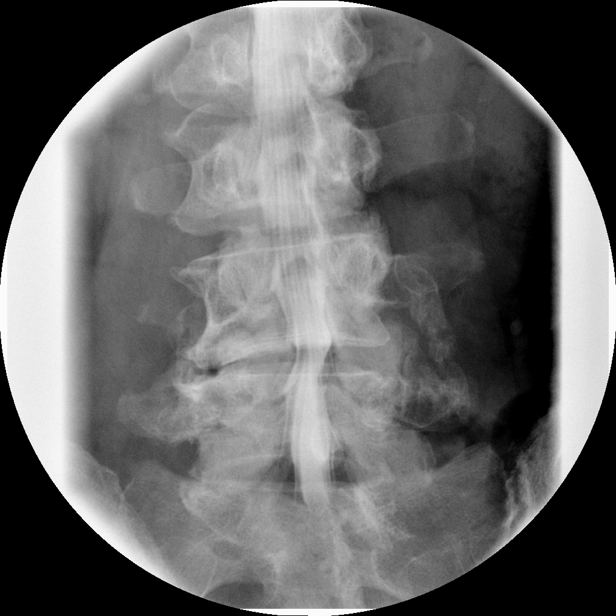

[Series 13: myelogram  white · 1 of 1 slices shown (10 of 10)]
[im 1/1]
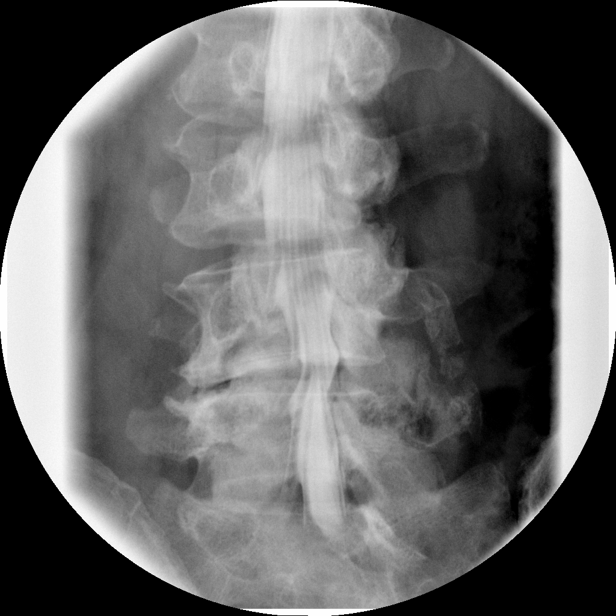

[Series 1001: view not recorded · 0.20mm/px · 1 of 1 slices shown (1 of 3)]
[im 1/1]
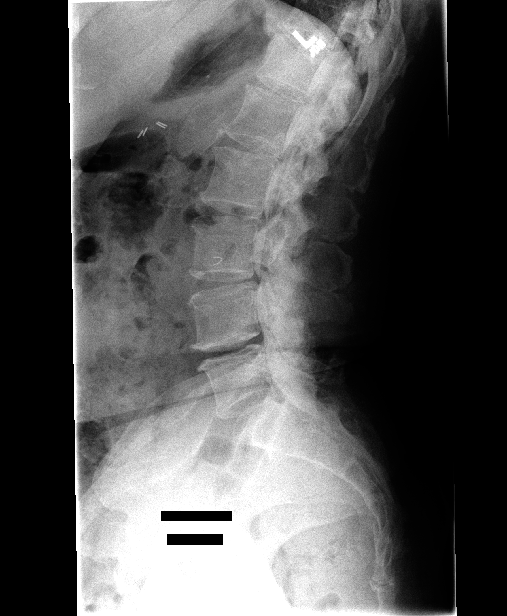

[Series 1002: view not recorded · 0.20mm/px · 1 of 1 slices shown (2 of 3)]
[im 1/1]
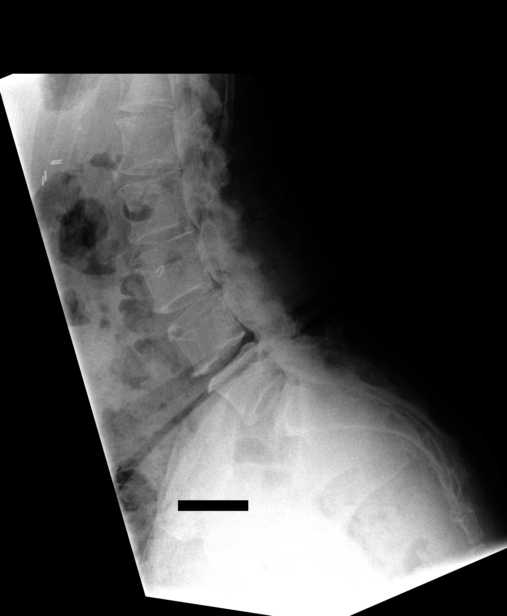

[Series 1003: view not recorded · 0.20mm/px · 1 of 1 slices shown (3 of 3)]
[im 1/1]
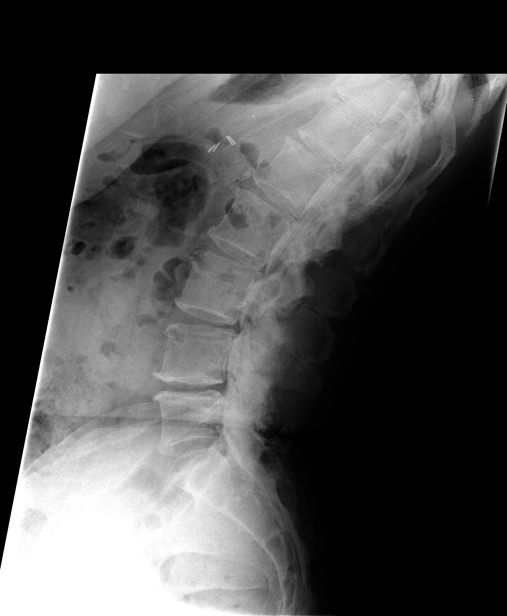

[15 of 16 positions shown; findings below may reference images not displayed]

FINDINGS: There is anatomic alignment of the vertebral bodies
without compression deformity.

L1-2:  Unremarkable.

L2-3: Moderate anterior epidural mass effect and spinal stenosis.

L3-4:  Moderate anterior epidural mass effect and spinal stenosis

L4-5:  Moderate left lateral recess narrowing and L5 nerve root
encroachment

L5 S1:  Unremarkable.

Flexion and extension views demonstrate no evidence of abnormal
motion to suggest instability.

Complications: None.

Fluoroscopy Time: 1 minute and 23 seconds.
IMPRESSION: Spinal stenosis at L2-3 and L3-4.

Left lateral recess narrowing at L4-5.

## 2015-02-25 ENCOUNTER — Encounter: Payer: Self-pay | Admitting: General Surgery

## 2015-03-08 ENCOUNTER — Ambulatory Visit: Payer: Medicaid Other | Admitting: General Surgery

## 2015-04-13 ENCOUNTER — Encounter: Payer: Self-pay | Admitting: General Surgery

## 2015-04-13 ENCOUNTER — Ambulatory Visit (INDEPENDENT_AMBULATORY_CARE_PROVIDER_SITE_OTHER): Payer: Medicaid Other | Admitting: General Surgery

## 2015-04-13 VITALS — BP 104/66 | HR 72 | Resp 16 | Ht 68.0 in | Wt 200.0 lb

## 2015-04-13 DIAGNOSIS — D0502 Lobular carcinoma in situ of left breast: Secondary | ICD-10-CM | POA: Diagnosis not present

## 2015-04-13 NOTE — Patient Instructions (Addendum)
Continue self breast exams. Call office for any new breast issues or concerns. Return to the office in 1 year with a bilateral screening mammogram.

## 2015-04-13 NOTE — Progress Notes (Signed)
Patient ID: Holly Espinoza, female   DOB: Nov 12, 1960, 55 y.o.   MRN: [id number]  Chief Complaint  Patient presents with  . Follow-up    HPI Holly Espinoza is a 55 y.o. female.  who presents for her breast cancer (LCIS) follow up and a breast evaluation. The most recent mammogram was done on 02-25-15.  Patient does perform regular self breast checks and gets regular mammograms done.   No new breast issues. I have reviewed the history of present illness with the patient. HPI  Past Medical History  Diagnosis Date  . Neuromuscular disorder ([place])   . Depression   . High cholesterol   . Type II diabetes mellitus ([place])   . Diabetic peripheral neuropathy ([place])   . B12 deficiency anemia   . Arthritis     "pretty much from my neck to my feet" (07/08/2012)  . Chronic lower back pain   . Cancer [facility]) 2014    left breast lobular carcinoma in situ  . Rheumatoid arthritis ([place])   . Gout     Past Surgical History  Procedure Laterality Date  . Anterior cervical decomp/discectomy fusion  2012  . Abdominal hysterectomy  2001?  Marland Kitchen Cholecystectomy  1980's  . Posterior lumbar fusion  07/08/2012  . Tonsillectomy  ~ 1968  . Dilation and curettage of uterus  [phone]    "probably 3" (07/08/2012)  . Back surgery  2001    laminectomy  . Fracture surgery      C4-7 Surgery for fracture  . Lumbar wound debridement N/A 08/12/2012    Procedure: LUMBAR WOUND DEBRIDEMENT;  Surgeon: Faythe Ghee, MD;  Location: [place] [facility];  Service: Neurosurgery;  Laterality: N/A;  Lumbar Wound Debridement  . Breast biopsy Left 2014  . Breast surgery Left 02/25/13    lumpectomy    Family History  Problem Relation Age of Onset  . Cancer Other     breast great grandmother    Social History Social History  Substance Use Topics  . Smoking status: Current Every Day Smoker -- 0.50 packs/day for 40 years    Types: Cigarettes  . Smokeless tobacco: Never Used  . Alcohol Use: No    No Known Allergies  Current  Outpatient Prescriptions  Medication Sig Dispense Refill  . ALLOPURINOL PO Take by mouth at bedtime.    Marland Kitchen aspirin 81 MG tablet Take 81 mg by mouth daily.    Marland Kitchen atorvastatin (LIPITOR) 40 MG tablet Take 40 mg by mouth daily.    . cyanocobalamin (,VITAMIN B-12,) 1000 MCG/ML injection Inject 1,000 mcg into the muscle every 30 (thirty) days. On the 1st of every month    . diazepam (VALIUM) 5 MG tablet Take 5 mg by mouth 4 (four) times daily as needed for anxiety. For muscle spasms    . DULoxetine (CYMBALTA) 60 MG capsule Take 60 mg by mouth daily.    . Exenatide ER (BYDUREON) 2 MG PEN Inject into the skin once a week.    . furosemide (LASIX) 20 MG tablet Take 20 mg by mouth daily.    Marland Kitchen levothyroxine (SYNTHROID, LEVOTHROID) 75 MCG tablet Take 75 mcg by mouth daily before breakfast.    . ondansetron (ZOFRAN) 4 MG tablet Take 4 mg by mouth every 8 (eight) hours as needed for nausea. For nausea    . Oxycodone HCl 10 MG TABS Take by mouth 3 (three) times daily.    Marland Kitchen oxymorphone (OPANA) 10 MG tablet Take 10 mg by mouth 2 (  two) times daily.    Marland Kitchen topiramate (TOPAMAX) 100 MG tablet Take 100 mg by mouth 2 (two) times daily.     No current facility-administered medications for this visit.    Review of Systems Review of Systems  Constitutional: Negative.   Respiratory: Negative.   Cardiovascular: Negative.     Blood pressure 104/66, pulse 72, resp. rate 16, height 5\' 8"  (1.727 m), weight 200 lb (90.719 kg).  Physical Exam Physical Exam  Constitutional: She is oriented to person, place, and time. She appears well-developed and well-nourished.  Eyes: Conjunctivae are normal. No scleral icterus.  Neck: Neck supple. No thyromegaly present.  Cardiovascular: Normal rate, regular rhythm and normal heart sounds.   Pulmonary/Chest: Effort normal and breath sounds normal. Right breast exhibits no inverted nipple, no mass, no nipple discharge, no skin change and no tenderness. Left breast exhibits no inverted  nipple, no mass, no nipple discharge, no skin change and no tenderness.  Left breast s/p lumpectomy  Abdominal: Soft. Bowel sounds are normal. There is no hepatomegaly. There is no tenderness.  Lymphadenopathy:    She has no cervical adenopathy.    She has no axillary adenopathy.  Neurological: She is alert and oriented to person, place, and time.  Skin: Skin is warm and dry.    Data Reviewed Notes and mammogram reviewed and stable.  Assessment    LCIS left breast, s/p lumpectomy. Stable exam.    Plan    Patient will be asked to return to the office in 1 year with a bilateral screening mammogram.     PCP:  Lavera Guise This information has been scribed by Karie Fetch RNBC.   SANKAR,SEEPLAPUTHUR G 04/15/2015, 6:00 AM

## 2015-04-15 ENCOUNTER — Encounter: Payer: Self-pay | Admitting: General Surgery

## 2015-06-14 ENCOUNTER — Other Ambulatory Visit: Payer: Self-pay | Admitting: Nurse Practitioner

## 2015-06-14 DIAGNOSIS — M542 Cervicalgia: Secondary | ICD-10-CM

## 2015-06-20 ENCOUNTER — Ambulatory Visit: Payer: Medicaid Other

## 2015-06-27 ENCOUNTER — Ambulatory Visit
Admission: RE | Admit: 2015-06-27 | Discharge: 2015-06-27 | Disposition: A | Discharge status: Home / Self Care | Payer: Medicaid Other | Source: Ambulatory Visit | Attending: Nurse Practitioner | Admitting: Nurse Practitioner

## 2015-06-27 DIAGNOSIS — M50122 Cervical disc disorder at C5-C6 level with radiculopathy: Secondary | ICD-10-CM | POA: Diagnosis not present

## 2015-06-27 DIAGNOSIS — M542 Cervicalgia: Secondary | ICD-10-CM | POA: Diagnosis not present

## 2015-06-27 DIAGNOSIS — M5382 Other specified dorsopathies, cervical region: Secondary | ICD-10-CM | POA: Insufficient documentation

## 2015-06-27 DIAGNOSIS — Z981 Arthrodesis status: Secondary | ICD-10-CM | POA: Insufficient documentation

## 2015-06-27 DIAGNOSIS — M50222 Other cervical disc displacement at C5-C6 level: Secondary | ICD-10-CM | POA: Insufficient documentation

## 2015-06-27 DIAGNOSIS — M4802 Spinal stenosis, cervical region: Secondary | ICD-10-CM | POA: Insufficient documentation

## 2015-07-04 ENCOUNTER — Telehealth: Payer: Self-pay | Admitting: Urology

## 2015-07-04 ENCOUNTER — Ambulatory Visit (INDEPENDENT_AMBULATORY_CARE_PROVIDER_SITE_OTHER): Payer: Medicaid Other | Admitting: Urology

## 2015-07-04 ENCOUNTER — Encounter: Payer: Self-pay | Admitting: Urology

## 2015-07-04 VITALS — BP 104/73 | HR 92 | Temp 98.7°F | Resp 16 | Wt 206.0 lb

## 2015-07-04 DIAGNOSIS — N8111 Cystocele, midline: Secondary | ICD-10-CM | POA: Diagnosis not present

## 2015-07-04 DIAGNOSIS — R350 Frequency of micturition: Secondary | ICD-10-CM

## 2015-07-04 NOTE — Progress Notes (Signed)
07/04/2015 9:27 AM   Clista Bernhardt 1960/08/03 [id number]  Referring provider: Lavera Guise, MD [address] [place], [place] [address]  Chief Complaint  Patient presents with  . New Patient (Initial Visit)  . Bladder Prolapse    HPI: The patient has worsening prolapse over the last few months. I don't think she is ever reduced it. She feels vaginal bulging and does not do splinting maneuvers  She is continent. She is up to 3 times a night. She voids every 1 hour and cannot sit through a 2 are moving. Her flow was good. She wears a pad just for confidence and support  She denies a history urinary tract infections previous bladder surgery and she has had a hysterectomy. She's had neck and back surgery all times and has rods. She takes injection for diabetes and has not had a stroke  Modifying factors: There are no other modifying factors  Associated signs and symptoms: There are no other associated signs and symptoms Aggravating and relieving factors: There are no other aggravating or relieving factors Severity: Moderate Duration: Persistent     PMH: Past Medical History  Diagnosis Date  . Neuromuscular disorder ([place])   . Depression   . High cholesterol   . Type II diabetes mellitus ([place])   . Diabetic peripheral neuropathy ([place])   . B12 deficiency anemia   . Arthritis     "pretty much from my neck to my feet" (07/08/2012)  . Chronic lower back pain   . Cancer [facility]) 2014    left breast lobular carcinoma in situ  . Rheumatoid arthritis ([place])   . Gout     Surgical History: Past Surgical History  Procedure Laterality Date  . Anterior cervical decomp/discectomy fusion  2012  . Abdominal hysterectomy  2001?  Marland Kitchen Cholecystectomy  1980's  . Posterior lumbar fusion  07/08/2012  . Tonsillectomy  ~ 1968  . Dilation and curettage of uterus  [phone]    "probably 3" (07/08/2012)  . Back surgery  2001    laminectomy  . Fracture surgery      C4-7 Surgery for fracture  .  Lumbar wound debridement N/A 08/12/2012    Procedure: LUMBAR WOUND DEBRIDEMENT;  Surgeon: Faythe Ghee, MD;  Location: [place] [facility] ORS;  Service: Neurosurgery;  Laterality: N/A;  Lumbar Wound Debridement  . Breast biopsy Left 2014  . Breast surgery Left 02/25/13    lumpectomy    Home Medications:    Medication List       This list is accurate as of: 07/04/15  9:27 AM.  Always use your most recent med list.               ALLOPURINOL PO  Take by mouth at bedtime.     aspirin 81 MG tablet  Take 81 mg by mouth daily.     atorvastatin 40 MG tablet  Commonly known as:  LIPITOR  Take 40 mg by mouth daily.     BYDUREON 2 MG Pen  Generic drug:  Exenatide ER  Inject into the skin once a week.     cyanocobalamin 1000 MCG/ML injection  Commonly known as:  (VITAMIN B-12)  Inject 1,000 mcg into the muscle every 30 (thirty) days. On the 1st of every month     diazepam 5 MG tablet  Commonly known as:  VALIUM  Take 5 mg by mouth 4 (four) times daily as needed for anxiety. For muscle spasms     DULoxetine 60 MG  capsule  Commonly known as:  CYMBALTA  Take 60 mg by mouth daily.     furosemide 20 MG tablet  Commonly known as:  LASIX  Take 20 mg by mouth daily.     levothyroxine 75 MCG tablet  Commonly known as:  SYNTHROID, LEVOTHROID  Take 75 mcg by mouth daily before breakfast.     ondansetron 4 MG tablet  Commonly known as:  ZOFRAN  Take 4 mg by mouth every 8 (eight) hours as needed for nausea. For nausea     Oxycodone HCl 10 MG Tabs  Take by mouth 3 (three) times daily.     oxymorphone 10 MG tablet  Commonly known as:  OPANA  Take 10 mg by mouth 2 (two) times daily.     topiramate 100 MG tablet  Commonly known as:  TOPAMAX  Take 100 mg by mouth 2 (two) times daily.        Allergies: No Known Allergies  Family History: Family History  Problem Relation Age of Onset  . Cancer Other     breast great grandmother  . Kidney disease Father     Social History:   reports that she has been smoking Cigarettes.  She has a 20 pack-year smoking history. She has never used smokeless tobacco. She reports that she does not drink alcohol or use illicit drugs.  ROS: UROLOGY Frequent Urination?: Yes Hard to postpone urination?: Yes Burning/pain with urination?: No Get up at night to urinate?: No Leakage of urine?: No Urine stream starts and stops?: No Trouble starting stream?: No Do you have to strain to urinate?: No Blood in urine?: No Urinary tract infection?: No Sexually transmitted disease?: No Injury to kidneys or bladder?: No Painful intercourse?: No Weak stream?: No Currently pregnant?: No Vaginal bleeding?: No  Gastrointestinal Nausea?: No Vomiting?: No Indigestion/heartburn?: No Diarrhea?: No Constipation?: Yes  Constitutional Fever: No Night sweats?: No Weight loss?: No Fatigue?: No  Skin Skin rash/lesions?: No Itching?: No  Eyes Blurred vision?: No Double vision?: No  Ears/Nose/Throat Sore throat?: No Sinus problems?: No  Hematologic/Lymphatic Swollen glands?: No Easy bruising?: No  Cardiovascular Leg swelling?: No Chest pain?: No  Respiratory Cough?: No Shortness of breath?: No  Endocrine Excessive thirst?: No  Musculoskeletal Back pain?: No Joint pain?: No  Neurological Headaches?: No Dizziness?: No  Psychologic Depression?: No Anxiety?: No  Physical Exam: BP 104/73 mmHg  Pulse 92  Temp(Src) 98.7 F (37.1 C)  Resp 16  Wt 206 lb (93.441 kg)  Constitutional:  Alert and oriented, No acute distress. HEENT: [place] AT, moist mucus membranes.  Trachea midline, no masses. Cardiovascular: No clubbing, cyanosis, or edema. Respiratory: Normal respiratory effort, no increased work of breathing. GI: Abdomen is soft, nontender, nondistended, no abdominal masses GU: No CVA tenderness. The patient has a grade 3 cystocele with modest central defect. She also has descensus at rest almost like a trapdoor  deformity. She has a mild diffuse grade 2 rectocele that in my opinion is more evidence because of significant deficiency of the posterior fourchette. She had no stress incontinence. The patient's vaginal cuff descended 2 or 3 cm but may ascend more when she is standing Skin: No rashes, bruises or suspicious lesions. Lymph: No cervical or inguinal adenopathy. Neurologic: Grossly intact, no focal deficits, moving all 4 extremities. Psychiatric: Normal mood and affect.  Laboratory Data: Lab Results  Component Value Date   WBC 8.2 01/15/2013   HGB 11.8* 01/15/2013   HCT 35.8 01/15/2013   MCV 83 01/15/2013  PLT 219 01/15/2013    Lab Results  Component Value Date   CREATININE 1.82* 02/23/2013    Urinalysis    Component Value Date/Time   COLORURINE Colorless 08/21/2012 1318   APPEARANCEUR Clear 08/21/2012 1318   LABSPEC 1.005 08/21/2012 1318   PHURINE 6.0 08/21/2012 1318   GLUCOSEU Negative 08/21/2012 1318   HGBUR Negative 08/21/2012 1318   BILIRUBINUR Negative 08/21/2012 1318   KETONESUR Negative 08/21/2012 1318   PROTEINUR Negative 08/21/2012 1318   NITRITE Negative 08/21/2012 1318   LEUKOCYTESUR Negative 08/21/2012 1318    Pertinent Imaging: none  Assessment & Plan:  The patient describes pelvic organ prolapse. She has increased frequency and mild nocturia. The patient is disabled primarily due to her back issues and is evaluated and treated in a chronic pain center  Picture was drawn. If the patient needed surgery she would likely best benefit from a transvaginal vault suspension with cystocele repair and graft. Based upon her local anatomy she has a higher recurrence rate in my opinion. She probably would benefit from a posterior repair but will need to be consented that it may not be appropriate since the findings are more in keeping with the deficiency versus the size of the rectocele. Role of urodynamics described. One will need to be cognizant of her chronic constipation  due to chronic pain medicines. Again this could threaten any posterior and anterior repair and will be taken into consideration  1. Cystocele 2. Nighttime frequency 3. Increased frequency   Reece Packer, MD  [facility] [address] [place], [place] [address] [phone]

## 2015-07-04 NOTE — Telephone Encounter (Signed)
Patient is schd for [facility] on 07-12-15 @ 2:00  [facility]axed notes to [facility] 07-04-15   michelle

## 2015-07-05 LAB — URINALYSIS, COMPLETE
Bilirubin, UA: NEGATIVE
Glucose, UA: NEGATIVE
Ketones, UA: NEGATIVE
Leukocytes, UA: NEGATIVE
Nitrite, UA: NEGATIVE
Protein, UA: NEGATIVE
RBC, UA: NEGATIVE
Specific Gravity, UA: 1.015 (ref 1.005–1.030)
Urobilinogen, Ur: 0.2 mg/dL (ref 0.2–1.0)
pH, UA: 7 (ref 5.0–7.5)

## 2015-07-05 LAB — MICROSCOPIC EXAMINATION: Bacteria, UA: NONE SEEN

## 2015-07-15 ENCOUNTER — Other Ambulatory Visit: Payer: Self-pay | Admitting: Urology

## 2015-08-02 DIAGNOSIS — E119 Type 2 diabetes mellitus without complications: Secondary | ICD-10-CM | POA: Insufficient documentation

## 2015-08-02 DIAGNOSIS — E1165 Type 2 diabetes mellitus with hyperglycemia: Secondary | ICD-10-CM | POA: Insufficient documentation

## 2015-08-02 DIAGNOSIS — E538 Deficiency of other specified B group vitamins: Secondary | ICD-10-CM | POA: Insufficient documentation

## 2015-08-02 DIAGNOSIS — M5430 Sciatica, unspecified side: Secondary | ICD-10-CM | POA: Insufficient documentation

## 2015-08-02 DIAGNOSIS — M1990 Unspecified osteoarthritis, unspecified site: Secondary | ICD-10-CM | POA: Insufficient documentation

## 2015-08-05 ENCOUNTER — Ambulatory Visit (INDEPENDENT_AMBULATORY_CARE_PROVIDER_SITE_OTHER): Payer: Medicaid Other | Admitting: Urology

## 2015-08-05 ENCOUNTER — Encounter: Payer: Self-pay | Admitting: Urology

## 2015-08-05 VITALS — BP 138/95 | HR 111 | Ht 68.0 in | Wt 210.3 lb

## 2015-08-05 DIAGNOSIS — N8111 Cystocele, midline: Secondary | ICD-10-CM

## 2015-08-05 NOTE — Progress Notes (Signed)
08/05/2015 2:29 PM   Holly Espinoza [birth year] [id number]  Referring provider: Lavera Guise, MD [address] [place], [place] [address]  Chief Complaint  Patient presents with  . Results    HPI: The patient has worsening prolapse over the last few months. I don't think she is ever reduced it. She feels vaginal bulging and does not do splinting maneuvers She is continent. She is up to 3 times a night. She voids every 1 hour and cannot sit through a 2 are moving. Her flow was good. She wears a pad just for confidence and support She denies a history urinary tract infections previous bladder surgery and she has had a hysterectomy. She's had neck and back surgery all times and has rods. She takes injection for diabetes and has not had a stroke  During the patient's last visit the patient had a grade 3 cystocele with moderate central defect she had a little bit of a trapdoor deformity. She had a mild diffuse grade 2 rectocele that was more evident because of her deficiency of the posterior fourchette.  LAST VISIT Picture was drawn. If the patient needed surgery she would likely best benefit from a transvaginal vault suspension with cystocele repair and graft. Based upon her local anatomy she has a higher recurrence rate in my opinion. She probably would benefit from a posterior repair but will need to be consented that it may not be appropriate since the findings are more in keeping with the deficiency versus the size of the rectocele. Role of urodynamics described. One will need to be cognizant of her chronic constipation due to chronic pain medicines. Again this could threaten any posterior and anterior repair and will be taken into consideration  Today on urodynamics her bladder capacity was 675 mL. Her bladder was high post sensitive. It was unstable but she did not leak. She had mild leakage with a Valsalva leak point pressure of 90 cm of water. This was only with the vaginal packing in  place. During voluntary voiding she voided 675 mL with a maximum flow of 11 mils per second. Maximum voiding pressure was 20 cm water and she empty efficiently. EMG activity increased during the voiding phase. Bladder neck descended 2 cm. Cystocele was noted fluoroscopically as was spinal hardware. She took a long time to void.   PMH: Past Medical History  Diagnosis Date  . Neuromuscular disorder ([place])   . Depression   . High cholesterol   . Type II diabetes mellitus ([place])   . Diabetic peripheral neuropathy ([place])   . B12 deficiency anemia   . Arthritis     "pretty much from my neck to my feet" (07/08/2012)  . Chronic lower back pain   . Cancer [facility]) 2014    left breast lobular carcinoma in situ  . Rheumatoid arthritis ([place])   . Gout     Surgical History: Past Surgical History  Procedure Laterality Date  . Anterior cervical decomp/discectomy fusion  2012  . Abdominal hysterectomy  2001?  Marland Kitchen Cholecystectomy  1980's  . Posterior lumbar fusion  07/08/2012  . Tonsillectomy  ~ 1968  . Dilation and curettage of uterus  [phone]    "probably 3" (07/08/2012)  . Back surgery  2001    laminectomy  . Fracture surgery      C4-7 Surgery for fracture  . Lumbar wound debridement N/A 08/12/2012    Procedure: LUMBAR WOUND DEBRIDEMENT;  Surgeon: Faythe Ghee, MD;  Location: [facility];  Service: Neurosurgery;  Laterality: N/A;  Lumbar Wound Debridement  . Breast biopsy Left 2014  . Breast surgery Left 02/25/13    lumpectomy    Home Medications:    Medication List       This list is accurate as of: 08/05/15  2:29 PM.  Always use your most recent med list.               ALLOPURINOL PO  Take by mouth at bedtime.     aspirin 81 MG tablet  Take 81 mg by mouth daily.     BYDUREON 2 MG Pen  Generic drug:  Exenatide ER  Inject into the skin once a week.     cyanocobalamin 1000 MCG/ML injection  Commonly known as:  (VITAMIN B-12)  Inject 1,000 mcg into the muscle every 30  (thirty) days. On the 1st of every month     diazepam 5 MG tablet  Commonly known as:  VALIUM  Take 5 mg by mouth 4 (four) times daily as needed for anxiety. For muscle spasms     DULoxetine 60 MG capsule  Commonly known as:  CYMBALTA  Take 60 mg by mouth daily.     furosemide 20 MG tablet  Commonly known as:  LASIX  Take 20 mg by mouth daily. Reported on 08/05/2015     gemfibrozil 600 MG tablet  Commonly known as:  LOPID  Take 600 mg by mouth 2 (two) times daily before a meal.     levothyroxine 75 MCG tablet  Commonly known as:  SYNTHROID, LEVOTHROID  Take 75 mcg by mouth daily before breakfast.     ondansetron 4 MG tablet  Commonly known as:  ZOFRAN  Take 4 mg by mouth every 8 (eight) hours as needed for nausea. For nausea     Oxycodone HCl 10 MG Tabs  Take by mouth 3 (three) times daily.     oxymorphone 10 MG tablet  Commonly known as:  OPANA  Take 10 mg by mouth 2 (two) times daily.     topiramate 100 MG tablet  Commonly known as:  TOPAMAX  Take 100 mg by mouth 2 (two) times daily.        Allergies: No Known Allergies  Family History: Family History  Problem Relation Age of Onset  . Cancer Other     breast great grandmother  . Kidney disease Father     Social History:  reports that she has been smoking Cigarettes.  She has a 20 pack-year smoking history. She has never used smokeless tobacco. She reports that she does not drink alcohol or use illicit drugs.  ROS: UROLOGY Frequent Urination?: Yes Hard to postpone urination?: Yes Burning/pain with urination?: No Get up at night to urinate?: Yes Leakage of urine?: No Urine stream starts and stops?: No Trouble starting stream?: No Do you have to strain to urinate?: No Blood in urine?: No Urinary tract infection?: No Sexually transmitted disease?: No Injury to kidneys or bladder?: No Painful intercourse?: No Weak stream?: No Currently pregnant?: No Vaginal bleeding?: No Last menstrual period?:  2005  Gastrointestinal Nausea?: Yes Vomiting?: No Indigestion/heartburn?: Yes Diarrhea?: No Constipation?: Yes  Constitutional Fever: No Night sweats?: No Weight loss?: No Fatigue?: No  Skin Skin rash/lesions?: No Itching?: No  Eyes Blurred vision?: No Double vision?: No  Ears/Nose/Throat Sore throat?: No Sinus problems?: No  Hematologic/Lymphatic Swollen glands?: No Easy bruising?: Yes  Cardiovascular Leg swelling?: Yes Chest pain?: Yes  Respiratory Cough?: Yes Shortness of breath?:  Yes  Endocrine Excessive thirst?: No  Musculoskeletal Back pain?: Yes Joint pain?: Yes  Neurological Headaches?: No Dizziness?: Yes  Psychologic Depression?: Yes Anxiety?: Yes  Physical Exam: BP 138/95 mmHg  Pulse 111  Ht 5\' 8"  (1.727 m)  Wt 210 lb 4.8 oz (95.391 kg)  BMI 31.98 kg/m2    Laboratory Data: Lab Results  Component Value Date   WBC 8.2 01/15/2013   HGB 11.8* 01/15/2013   HCT 35.8 01/15/2013   MCV 83 01/15/2013   PLT 219 01/15/2013    Lab Results  Component Value Date   CREATININE 1.82* 02/23/2013    No results found for: PSA  No results found for: TESTOSTERONE  No results found for: HGBA1C  Urinalysis    Component Value Date/Time   COLORURINE Colorless 08/21/2012 1318   APPEARANCEUR Clear 07/04/2015 0948   APPEARANCEUR Clear 08/21/2012 1318   LABSPEC 1.005 08/21/2012 1318   PHURINE 6.0 08/21/2012 1318   GLUCOSEU Negative 07/04/2015 0948   GLUCOSEU Negative 08/21/2012 1318   HGBUR Negative 08/21/2012 1318   BILIRUBINUR Negative 07/04/2015 0948   BILIRUBINUR Negative 08/21/2012 1318   KETONESUR Negative 08/21/2012 1318   PROTEINUR Negative 07/04/2015 0948   PROTEINUR Negative 08/21/2012 1318   NITRITE Negative 07/04/2015 0948   NITRITE Negative 08/21/2012 1318   LEUKOCYTESUR Negative 07/04/2015 0948   LEUKOCYTESUR Negative 08/21/2012 1318    Pertinent Imaging: NONE  Assessment & Plan:  I reviewed the above surgery as  noted. I went through my entire pros and cons and risks as noted on my template at [facility]. Success and failure rates were emphasized. Sequelae from complications were emphasized. Mesh issues discussed. Injury to adjacent structures was stressed and sequelae. Worsening incontinence was discussed. With her risk of a neurogenic bladder and voiding phase and bladder hyposensitivity and lack of urinary incontinence I felt that the sling was probably not in her best interest.  I spent a lot of time with her and her daughter. All questions answered. I can understand why she is a bit reluctant but she is younger and hopes to be safely active in the future. She says when she takes laxatives twice a day she can manage her bowels reasonably well.  My staff will call her and think overall she made a good decision. The more I spoke with her I truly do not believe the sling was in her best interest. Role of late sling also mentioned if needed  There are no diagnoses linked to this encounter.  No Follow-up on file.  Reece Packer, MD  [facility] [address] [place], [place] [address] [phone]

## 2015-08-16 ENCOUNTER — Other Ambulatory Visit: Payer: Self-pay | Admitting: Neurosurgery

## 2015-08-16 DIAGNOSIS — M5412 Radiculopathy, cervical region: Secondary | ICD-10-CM

## 2015-08-18 ENCOUNTER — Other Ambulatory Visit: Payer: Self-pay | Admitting: Urology

## 2015-08-23 ENCOUNTER — Ambulatory Visit
Admission: RE | Admit: 2015-08-23 | Discharge: 2015-08-23 | Disposition: A | Discharge status: Home / Self Care | Payer: Medicaid Other | Source: Ambulatory Visit | Attending: Neurosurgery | Admitting: Neurosurgery

## 2015-08-23 DIAGNOSIS — M5412 Radiculopathy, cervical region: Secondary | ICD-10-CM

## 2015-10-26 ENCOUNTER — Encounter (HOSPITAL_COMMUNITY)
Admission: RE | Admit: 2015-10-26 | Discharge: 2015-10-26 | Disposition: A | Discharge status: Home / Self Care | Payer: Medicaid Other | Source: Ambulatory Visit | Attending: Urology | Admitting: Urology

## 2015-10-26 ENCOUNTER — Encounter (HOSPITAL_COMMUNITY): Payer: Self-pay

## 2015-10-26 DIAGNOSIS — N8110 Cystocele, unspecified: Secondary | ICD-10-CM | POA: Diagnosis not present

## 2015-10-26 DIAGNOSIS — N816 Rectocele: Secondary | ICD-10-CM | POA: Diagnosis not present

## 2015-10-26 DIAGNOSIS — Z01812 Encounter for preprocedural laboratory examination: Secondary | ICD-10-CM | POA: Diagnosis not present

## 2015-10-26 DIAGNOSIS — Z0183 Encounter for blood typing: Secondary | ICD-10-CM | POA: Diagnosis not present

## 2015-10-26 HISTORY — DX: Anxiety disorder, unspecified: F41.9

## 2015-10-26 LAB — CBC
HCT: 37.3 % (ref 36.0–46.0)
Hemoglobin: 12.2 g/dL (ref 12.0–15.0)
MCH: 28.8 pg (ref 26.0–34.0)
MCHC: 32.7 g/dL (ref 30.0–36.0)
MCV: 88 fL (ref 78.0–100.0)
Platelets: 259 10*3/uL (ref 150–400)
RBC: 4.24 MIL/uL (ref 3.87–5.11)
RDW: 14.2 % (ref 11.5–15.5)
WBC: 6 10*3/uL (ref 4.0–10.5)

## 2015-10-26 LAB — PROTIME-INR
INR: 0.99
Prothrombin Time: 13.1 seconds (ref 11.4–15.2)

## 2015-10-26 LAB — BASIC METABOLIC PANEL
Anion gap: 6 (ref 5–15)
BUN: 23 mg/dL — ABNORMAL HIGH (ref 6–20)
CO2: 22 mmol/L (ref 22–32)
Calcium: 9.6 mg/dL (ref 8.9–10.3)
Chloride: 109 mmol/L (ref 101–111)
Creatinine, Ser: 1.12 mg/dL — ABNORMAL HIGH (ref 0.44–1.00)
GFR calc Af Amer: >60 mL/min (ref >60)
GFR calc non Af Amer: 54 mL/min — ABNORMAL LOW (ref >60)
Glucose, Bld: 97 mg/dL (ref 65–99)
Potassium: 4.4 mmol/L (ref 3.5–5.1)
Sodium: 137 mmol/L (ref 135–145)

## 2015-10-26 LAB — APTT: aPTT: 36 seconds (ref 24–36)

## 2015-10-26 LAB — ABO/RH: ABO/RH(D): A POS

## 2015-10-26 NOTE — Patient Instructions (Signed)
Holly Espinoza  10/26/2015   Your procedure is scheduled on: 5:30 am  Report to [place]  Entrance take [facility]  elevators to 3rd floor to  [place] at 5:30 AM.  Call this number if you have problems the morning of surgery [phone]   Remember: ONLY 1 PERSON MAY GO WITH YOU TO SHORT STAY TO GET  READY MORNING OF [place].  Do not eat food or drink liquids :After Midnight.     Take these medicines the morning of surgery with A SIP OF WATER: Diazepam, Cymbalta, Levothyroxine, Omeprazole           DO NOT TAKE ANY DIABETIC MEDICATIONS DAY OF YOUR SURGERY                               You may not have any metal on your body including hair pins and              piercings  Do not wear jewelry, make-up, lotions, powders or perfumes, deodorant             Do not wear nail polish.  Do not shave  48 hours prior to surgery.                 Do not bring valuables to the hospital. [place].  Contacts, dentures or bridgework may not be worn into surgery.  Leave suitcase in the car. After surgery it may be brought to your room.     Patients discharged the day of surgery will not be allowed to drive home.  Name and phone number of your driver:brother Charles      _____________________________________________________________________             [facility] - Preparing for Surgery  Before surgery, you can play an important role.  Because skin is not sterile, your skin needs to be as free of germs as possible.  You can reduce the number of germs on you skin by washing with CHG (chlorahexidine gluconate) soap before surgery.  CHG is an antiseptic cleaner which kills germs and bonds with the skin to continue killing germs even after washing.  Please DO NOT use if you have an allergy to CHG or antibacterial soaps.  If your skin becomes reddened/irritated stop using the CHG and inform your nurse when you  arrive at Short Stay.  Do not shave (including legs and underarms) for at least 48 hours prior to the first CHG shower.  You may shave your face.  Please follow these instructions carefully:   1.  Shower with CHG Soap the night before surgery and the                                morning of Surgery.  2.  If you choose to wash your hair, wash your hair first as usual with your       normal shampoo.  3.  After you shampoo, rinse your hair and body thoroughly to remove the                      Shampoo.  4.  Use  CHG as you would any other liquid soap.  You can apply chg directly       to the skin and wash gently with scrungie or a clean washcloth.  5.  Apply the CHG Soap to your body ONLY FROM THE NECK DOWN.        Do not use on open wounds or open sores.  Avoid contact with your eyes,       ears, mouth and genitals (private parts).  Wash genitals (private parts)       with your normal soap.  6.  Wash thoroughly, paying special attention to the area where your surgery        will be performed.  7.  Thoroughly rinse your body with warm water from the neck down.  8.  DO NOT shower/wash with your normal soap after using and rinsing off       the CHG Soap.  9.  Pat yourself dry with a clean towel.            10.  Wear clean pajamas.            11.  Place clean sheets on your bed the night of your first shower and do not        sleep with pets.  Day of Surgery  Do not apply any lotions/deoderants the morning of surgery.  Please wear clean clothes to the hospital/surgery center.

## 2015-10-26 NOTE — Pre-Procedure Instructions (Signed)
September 19, 2015 hgb A1C was 5.4 per medical records at Dr. Laurelyn Sickle office in [place], [place]

## 2015-10-31 MED ORDER — GENTAMICIN SULFATE 40 MG/ML IJ SOLN
5.0000 mg/kg | INTRAVENOUS | Status: AC
  Administered 2015-11-01: 370 mg via INTRAVENOUS
  Filled 2015-10-31 (×2): qty 9.25

## 2015-10-31 NOTE — H&P (Signed)
HPI: The patient has worsening prolapse over the last few months. I don't think she is ever reduced it. She feels vaginal bulging and does not do splinting maneuvers She is continent. She is up to 3 times a night. She voids every 1 hour and cannot sit through a 2 are moving. Her flow was good. She wears a pad just for confidence and support She denies a history urinary tract infections previous bladder surgery and she has had a hysterectomy. She's had neck and back surgery all times and has rods. She takes injection for diabetes and has not had a stroke  During the patient's last visit the patient had a grade 3 cystocele with moderate central defect she had a little bit of a trapdoor deformity. She had a mild diffuse grade 2 rectocele that was more evident because of her deficiency of the posterior fourchette.  LAST VISIT Picture was drawn. If the patient needed surgery she would likely best benefit from a transvaginal vault suspension with cystocele repair and graft. Based upon her local anatomy she has a higher recurrence rate in my opinion. She probably would benefit from a posterior repair but will need to be consented that it may not be appropriate since the findings are more in keeping with the deficiency versus the size of the rectocele. Role of urodynamics described. One will need to be cognizant of her chronic constipation due to chronic pain medicines. Again this could threaten any posterior and anterior repair and will be taken into consideration  Today on urodynamics her bladder capacity was 675 mL. Her bladder was high post sensitive. It was unstable but she did not leak. She had mild leakage with a Valsalva leak point pressure of 90 cm of water. This was only with the vaginal packing in place. During voluntary voiding she voided 675 mL with a maximum flow of 11 mils per second. Maximum voiding pressure was 20 cm water and she empty efficiently. EMG activity increased during the voiding  phase. Bladder neck descended 2 cm. Cystocele was noted fluoroscopically as was spinal hardware. She took a long time to void.   PMH:      Past Medical History  Diagnosis Date  . Neuromuscular disorder ([place])   . Depression   . High cholesterol   . Type II diabetes mellitus ([place])   . Diabetic peripheral neuropathy ([place])   . B12 deficiency anemia   . Arthritis     "pretty much from my neck to my feet" (07/08/2012)  . Chronic lower back pain   . Cancer [facility]) 2014    left breast lobular carcinoma in situ  . Rheumatoid arthritis ([place])   . Gout     Surgical History:       Past Surgical History  Procedure Laterality Date  . Anterior cervical decomp/discectomy fusion  2012  . Abdominal hysterectomy  2001?  Marland Kitchen Cholecystectomy  1980's  . Posterior lumbar fusion  07/08/2012  . Tonsillectomy  ~ 1968  . Dilation and curettage of uterus  [phone]    "probably 3" (07/08/2012)  . Back surgery  2001    laminectomy  . Fracture surgery      C4-7 Surgery for fracture  . Lumbar wound debridement N/A 08/12/2012    Procedure: LUMBAR WOUND DEBRIDEMENT;  Surgeon: Faythe Ghee, MD;  Location: [place] [facility];  Service: Neurosurgery;  Laterality: N/A;  Lumbar Wound Debridement  . Breast biopsy Left 2014  . Breast surgery Left 02/25/13    lumpectomy  Home Medications:        Medication List       This list is accurate as of: 08/05/15  2:29 PM.  Always use your most recent med list.                ALLOPURINOL PO  Take by mouth at bedtime.     aspirin 81 MG tablet  Take 81 mg by mouth daily.     BYDUREON 2 MG Pen  Generic drug:  Exenatide ER  Inject into the skin once a week.     cyanocobalamin 1000 MCG/ML injection  Commonly known as:  (VITAMIN B-12)  Inject 1,000 mcg into the muscle every 30 (thirty) days. On the 1st of every month     diazepam 5 MG tablet  Commonly known as:  VALIUM  Take 5 mg by mouth 4 (four)  times daily as needed for anxiety. For muscle spasms     DULoxetine 60 MG capsule  Commonly known as:  CYMBALTA  Take 60 mg by mouth daily.     furosemide 20 MG tablet  Commonly known as:  LASIX  Take 20 mg by mouth daily. Reported on 08/05/2015     gemfibrozil 600 MG tablet  Commonly known as:  LOPID  Take 600 mg by mouth 2 (two) times daily before a meal.     levothyroxine 75 MCG tablet  Commonly known as:  SYNTHROID, LEVOTHROID  Take 75 mcg by mouth daily before breakfast.     ondansetron 4 MG tablet  Commonly known as:  ZOFRAN  Take 4 mg by mouth every 8 (eight) hours as needed for nausea. For nausea     Oxycodone HCl 10 MG Tabs  Take by mouth 3 (three) times daily.     oxymorphone 10 MG tablet  Commonly known as:  OPANA  Take 10 mg by mouth 2 (two) times daily.     topiramate 100 MG tablet  Commonly known as:  TOPAMAX  Take 100 mg by mouth 2 (two) times daily.        Allergies: No Known Allergies  Family History:       Family History  Problem Relation Age of Onset  . Cancer Other     breast great grandmother  . Kidney disease Father     Social History:  reports that she has been smoking Cigarettes.  She has a 20 pack-year smoking history. She has never used smokeless tobacco. She reports that she does not drink alcohol or use illicit drugs.  ROS: UROLOGY Frequent Urination?: Yes Hard to postpone urination?: Yes Burning/pain with urination?: No Get up at night to urinate?: Yes Leakage of urine?: No Urine stream starts and stops?: No Trouble starting stream?: No Do you have to strain to urinate?: No Blood in urine?: No Urinary tract infection?: No Sexually transmitted disease?: No Injury to kidneys or bladder?: No Painful intercourse?: No Weak stream?: No Currently pregnant?: No Vaginal bleeding?: No Last menstrual period?: 2005  Gastrointestinal Nausea?: Yes Vomiting?: No Indigestion/heartburn?: Yes Diarrhea?:  No Constipation?: Yes  Constitutional Fever: No Night sweats?: No Weight loss?: No Fatigue?: No  Skin Skin rash/lesions?: No Itching?: No  Eyes Blurred vision?: No Double vision?: No  Ears/Nose/Throat Sore throat?: No Sinus problems?: No  Hematologic/Lymphatic Swollen glands?: No Easy bruising?: Yes  Cardiovascular Leg swelling?: Yes Chest pain?: Yes  Respiratory Cough?: Yes Shortness of breath?: Yes  Endocrine Excessive thirst?: No  Musculoskeletal Back pain?: Yes Joint pain?: Yes  Neurological Headaches?:  No Dizziness?: Yes  Psychologic Depression?: Yes Anxiety?: Yes  Physical Exam: BP 138/95 mmHg  Pulse 111  Ht 5\' 8"  (1.727 m)  Wt 210 lb 4.8 oz (95.391 kg)  BMI 31.98 kg/m2    Laboratory Data: Recent Labs       Lab Results  Component Value Date   WBC 8.2 01/15/2013   HGB 11.8* 01/15/2013   HCT 35.8 01/15/2013   MCV 83 01/15/2013   PLT 219 01/15/2013      Recent Labs       Lab Results  Component Value Date   CREATININE 1.82* 02/23/2013      Recent Labs  No results found for: PSA    Recent Labs  No results found for: TESTOSTERONE    Recent Labs  No results found for: HGBA1C    Urinalysis Labs (Brief)          Component Value Date/Time   COLORURINE Colorless 08/21/2012 1318   APPEARANCEUR Clear 07/04/2015 0948   APPEARANCEUR Clear 08/21/2012 1318   LABSPEC 1.005 08/21/2012 1318   PHURINE 6.0 08/21/2012 1318   GLUCOSEU Negative 07/04/2015 0948   GLUCOSEU Negative 08/21/2012 1318   HGBUR Negative 08/21/2012 1318   BILIRUBINUR Negative 07/04/2015 0948   BILIRUBINUR Negative 08/21/2012 1318   KETONESUR Negative 08/21/2012 1318   PROTEINUR Negative 07/04/2015 0948   PROTEINUR Negative 08/21/2012 1318   NITRITE Negative 07/04/2015 0948   NITRITE Negative 08/21/2012 1318   LEUKOCYTESUR Negative 07/04/2015 0948   LEUKOCYTESUR Negative 08/21/2012 1318      Pertinent  Imaging: NONE  Assessment & Plan:  I reviewed the above surgery as noted. I went through my entire pros and cons and risks as noted on my template at [facility]. Success and failure rates were emphasized. Sequelae from complications were emphasized. Mesh issues discussed. Injury to adjacent structures was stressed and sequelae. Worsening incontinence was discussed. With her risk of a neurogenic bladder and voiding phase and bladder hyposensitivity and lack of urinary incontinence I felt that the sling was probably not in her best interest.  I spent a lot of time with her and her daughter. All questions answered. I can understand why she is a bit reluctant but she is younger and hopes to be safely active in the future. She says when she takes laxatives twice a day she can manage her bowels reasonably well.  My staff will call her and think overall she made a good decision. The more I spoke with her I truly do not believe the sling was in her best interest. Role of late sling also mentioned if needed  After a thorough review of the management options for the patient's condition the patient  elected to proceed with surgical therapy as noted above. We have discussed the potential benefits and risks of the procedure, side effects of the proposed treatment, the likelihood of the patient achieving the goals of the procedure, and any potential problems that might occur during the procedure or recuperation. Informed consent has been obtained.

## 2015-11-01 ENCOUNTER — Encounter (HOSPITAL_COMMUNITY)
Admission: RE | Disposition: A | Discharge status: Home / Self Care | Payer: Self-pay | Source: Ambulatory Visit | Attending: Urology

## 2015-11-01 ENCOUNTER — Encounter (HOSPITAL_COMMUNITY): Payer: Self-pay | Admitting: *Deleted

## 2015-11-01 ENCOUNTER — Ambulatory Visit (HOSPITAL_COMMUNITY): Payer: Medicaid Other | Admitting: Anesthesiology

## 2015-11-01 ENCOUNTER — Observation Stay (HOSPITAL_COMMUNITY)
Admission: RE | Admit: 2015-11-01 | Discharge: 2015-11-02 | Disposition: A | Discharge status: Home / Self Care | Payer: Medicaid Other | Source: Ambulatory Visit | Attending: Urology | Admitting: Urology

## 2015-11-01 DIAGNOSIS — D519 Vitamin B12 deficiency anemia, unspecified: Secondary | ICD-10-CM | POA: Insufficient documentation

## 2015-11-01 DIAGNOSIS — G8929 Other chronic pain: Secondary | ICD-10-CM | POA: Insufficient documentation

## 2015-11-01 DIAGNOSIS — M545 Low back pain: Secondary | ICD-10-CM | POA: Diagnosis not present

## 2015-11-01 DIAGNOSIS — Z7982 Long term (current) use of aspirin: Secondary | ICD-10-CM | POA: Diagnosis not present

## 2015-11-01 DIAGNOSIS — Z79899 Other long term (current) drug therapy: Secondary | ICD-10-CM | POA: Diagnosis not present

## 2015-11-01 DIAGNOSIS — Z79891 Long term (current) use of opiate analgesic: Secondary | ICD-10-CM | POA: Diagnosis not present

## 2015-11-01 DIAGNOSIS — M069 Rheumatoid arthritis, unspecified: Secondary | ICD-10-CM | POA: Diagnosis not present

## 2015-11-01 DIAGNOSIS — D649 Anemia, unspecified: Secondary | ICD-10-CM | POA: Diagnosis not present

## 2015-11-01 DIAGNOSIS — M1990 Unspecified osteoarthritis, unspecified site: Secondary | ICD-10-CM | POA: Insufficient documentation

## 2015-11-01 DIAGNOSIS — N816 Rectocele: Secondary | ICD-10-CM | POA: Insufficient documentation

## 2015-11-01 DIAGNOSIS — E7800 Pure hypercholesterolemia, unspecified: Secondary | ICD-10-CM | POA: Insufficient documentation

## 2015-11-01 DIAGNOSIS — I10 Essential (primary) hypertension: Secondary | ICD-10-CM | POA: Diagnosis not present

## 2015-11-01 DIAGNOSIS — F17210 Nicotine dependence, cigarettes, uncomplicated: Secondary | ICD-10-CM | POA: Insufficient documentation

## 2015-11-01 DIAGNOSIS — M109 Gout, unspecified: Secondary | ICD-10-CM | POA: Insufficient documentation

## 2015-11-01 DIAGNOSIS — N993 Prolapse of vaginal vault after hysterectomy: Secondary | ICD-10-CM | POA: Diagnosis not present

## 2015-11-01 DIAGNOSIS — F329 Major depressive disorder, single episode, unspecified: Secondary | ICD-10-CM | POA: Diagnosis not present

## 2015-11-01 DIAGNOSIS — N8110 Cystocele, unspecified: Secondary | ICD-10-CM | POA: Diagnosis not present

## 2015-11-01 DIAGNOSIS — E1142 Type 2 diabetes mellitus with diabetic polyneuropathy: Secondary | ICD-10-CM | POA: Diagnosis not present

## 2015-11-01 HISTORY — PX: ANTERIOR AND POSTERIOR REPAIR: SHX5121

## 2015-11-01 LAB — TYPE AND SCREEN
ABO/RH(D): A POS
Antibody Screen: NEGATIVE

## 2015-11-01 LAB — GLUCOSE, CAPILLARY
Glucose-Capillary: 114 mg/dL — ABNORMAL HIGH (ref 65–99)
Glucose-Capillary: 104 mg/dL — ABNORMAL HIGH (ref 65–99)

## 2015-11-01 LAB — HEMOGLOBIN AND HEMATOCRIT, BLOOD
HCT: 33.1 % — ABNORMAL LOW (ref 36.0–46.0)
Hemoglobin: 10.7 g/dL — ABNORMAL LOW (ref 12.0–15.0)

## 2015-11-01 SURGERY — ANTERIOR (CYSTOCELE) AND POSTERIOR REPAIR (RECTOCELE)
Anesthesia: General

## 2015-11-01 MED ORDER — ESTRADIOL 0.1 MG/GM VA CREA
TOPICAL_CREAM | VAGINAL | Status: AC
  Filled 2015-11-01: qty 85

## 2015-11-01 MED ORDER — OXYCODONE-ACETAMINOPHEN 5-325 MG PO TABS
1.0000 | ORAL_TABLET | ORAL | Status: DC | PRN
  Administered 2015-11-01 – 2015-11-02 (×3): 2 via ORAL
  Filled 2015-11-01 (×2): qty 1
  Filled 2015-11-01 (×3): qty 2

## 2015-11-01 MED ORDER — METOCLOPRAMIDE HCL 5 MG/ML IJ SOLN: 10.0000 mg | Freq: Once | INTRAMUSCULAR | Status: DC | PRN

## 2015-11-01 MED ORDER — SODIUM CHLORIDE 0.9 % IR SOLN
Status: AC
  Filled 2015-11-01: qty 1

## 2015-11-01 MED ORDER — ACETAMINOPHEN 10 MG/ML IV SOLN
INTRAVENOUS | Status: AC
  Filled 2015-11-01: qty 100

## 2015-11-01 MED ORDER — PANTOPRAZOLE SODIUM 40 MG PO TBEC
40.0000 mg | DELAYED_RELEASE_TABLET | Freq: Every day | ORAL | Status: DC
  Administered 2015-11-01 – 2015-11-02 (×2): 40 mg via ORAL
  Filled 2015-11-01 (×2): qty 1

## 2015-11-01 MED ORDER — KETAMINE HCL 10 MG/ML IJ SOLN
INTRAMUSCULAR | Status: DC | PRN
  Administered 2015-11-01 (×6): 10 mg via INTRAVENOUS

## 2015-11-01 MED ORDER — SUGAMMADEX SODIUM 200 MG/2ML IV SOLN
INTRAVENOUS | Status: AC
  Filled 2015-11-01: qty 2

## 2015-11-01 MED ORDER — ESTRADIOL 0.1 MG/GM VA CREA
TOPICAL_CREAM | VAGINAL | Status: DC | PRN
  Administered 2015-11-01: 1 via VAGINAL

## 2015-11-01 MED ORDER — DIPHENHYDRAMINE HCL 50 MG/ML IJ SOLN: 12.5000 mg | Freq: Four times a day (QID) | INTRAMUSCULAR | Status: DC | PRN

## 2015-11-01 MED ORDER — FUROSEMIDE 20 MG PO TABS: 20.0000 mg | ORAL_TABLET | Freq: Every day | ORAL | Status: DC | PRN

## 2015-11-01 MED ORDER — KETAMINE HCL 10 MG/ML IJ SOLN
INTRAMUSCULAR | Status: AC
Start: 2015-11-01 — End: 2015-11-01
  Filled 2015-11-01: qty 1

## 2015-11-01 MED ORDER — DEXTROSE-NACL 5-0.45 % IV SOLN
INTRAVENOUS | Status: DC
  Administered 2015-11-01 – 2015-11-02 (×3): via INTRAVENOUS

## 2015-11-01 MED ORDER — LIDOCAINE HCL (CARDIAC) 20 MG/ML IV SOLN
INTRAVENOUS | Status: AC
  Filled 2015-11-01: qty 5

## 2015-11-01 MED ORDER — PROPOFOL 10 MG/ML IV BOLUS
INTRAVENOUS | Status: AC
  Filled 2015-11-01: qty 20

## 2015-11-01 MED ORDER — MIDAZOLAM HCL 5 MG/5ML IJ SOLN
INTRAMUSCULAR | Status: DC | PRN
  Administered 2015-11-01: 2 mg via INTRAVENOUS

## 2015-11-01 MED ORDER — LACTATED RINGERS IV BOLUS (SEPSIS)
500.0000 mL | Freq: Once | INTRAVENOUS | Status: AC
  Administered 2015-11-01: 500 mL via INTRAVENOUS

## 2015-11-01 MED ORDER — PHENYLEPHRINE 40 MCG/ML (10ML) SYRINGE FOR IV PUSH (FOR BLOOD PRESSURE SUPPORT)
PREFILLED_SYRINGE | INTRAVENOUS | Status: AC
  Filled 2015-11-01: qty 10

## 2015-11-01 MED ORDER — DIPHENHYDRAMINE HCL 12.5 MG/5ML PO ELIX
12.5000 mg | ORAL_SOLUTION | Freq: Four times a day (QID) | ORAL | Status: DC | PRN
  Administered 2015-11-02: 12.5 mg via ORAL
  Filled 2015-11-01: qty 5

## 2015-11-01 MED ORDER — SUGAMMADEX SODIUM 200 MG/2ML IV SOLN
INTRAVENOUS | Status: DC | PRN
  Administered 2015-11-01: 200 mg via INTRAVENOUS

## 2015-11-01 MED ORDER — LACTATED RINGERS IV SOLN
INTRAVENOUS | Status: DC
  Administered 2015-11-01 (×3): via INTRAVENOUS

## 2015-11-01 MED ORDER — ROCURONIUM BROMIDE 100 MG/10ML IV SOLN
INTRAVENOUS | Status: AC
Start: 2015-11-01 — End: 2015-11-01
  Filled 2015-11-01: qty 1

## 2015-11-01 MED ORDER — CEFAZOLIN SODIUM-DEXTROSE 2-4 GM/100ML-% IV SOLN
INTRAVENOUS | Status: AC
  Filled 2015-11-01: qty 100

## 2015-11-01 MED ORDER — ESTRADIOL 0.1 MG/GM VA CREA
TOPICAL_CREAM | VAGINAL | Status: AC
  Filled 2015-11-01: qty 42.5

## 2015-11-01 MED ORDER — OXYCODONE HCL ER 10 MG PO T12A
10.0000 mg | EXTENDED_RELEASE_TABLET | Freq: Two times a day (BID) | ORAL | Status: DC
  Administered 2015-11-01 – 2015-11-02 (×3): 10 mg via ORAL
  Filled 2015-11-01 (×3): qty 1

## 2015-11-01 MED ORDER — MIRTAZAPINE 15 MG PO TABS
15.0000 mg | ORAL_TABLET | Freq: Every day | ORAL | Status: DC
  Administered 2015-11-01: 15 mg via ORAL
  Filled 2015-11-01: qty 1

## 2015-11-01 MED ORDER — LACTATED RINGERS IV SOLN: INTRAVENOUS | Status: DC

## 2015-11-01 MED ORDER — LIDOCAINE-EPINEPHRINE (PF) 1 %-1:200000 IJ SOLN
INTRAMUSCULAR | Status: AC
Start: 2015-11-01 — End: 2015-11-01
  Filled 2015-11-01: qty 30

## 2015-11-01 MED ORDER — SODIUM CHLORIDE 0.9 % IR SOLN
Status: DC | PRN
  Administered 2015-11-01: 500 mL

## 2015-11-01 MED ORDER — HYDROMORPHONE HCL 2 MG/ML IJ SOLN
INTRAMUSCULAR | Status: AC
  Filled 2015-11-01: qty 1

## 2015-11-01 MED ORDER — PHENAZOPYRIDINE HCL 200 MG PO TABS
200.0000 mg | ORAL_TABLET | Freq: Once | ORAL | Status: AC
  Administered 2015-11-01: 200 mg via ORAL
  Filled 2015-11-01: qty 1

## 2015-11-01 MED ORDER — LIDOCAINE-EPINEPHRINE (PF) 1 %-1:200000 IJ SOLN
INTRAMUSCULAR | Status: DC | PRN
  Administered 2015-11-01 (×2): 20 mL

## 2015-11-01 MED ORDER — CEFAZOLIN SODIUM-DEXTROSE 2-4 GM/100ML-% IV SOLN
2.0000 g | INTRAVENOUS | Status: AC
  Administered 2015-11-01: 2 g via INTRAVENOUS
  Filled 2015-11-01: qty 100

## 2015-11-01 MED ORDER — ONDANSETRON HCL 4 MG/2ML IJ SOLN
INTRAMUSCULAR | Status: DC | PRN
  Administered 2015-11-01: 4 mg via INTRAVENOUS

## 2015-11-01 MED ORDER — FENTANYL CITRATE (PF) 250 MCG/5ML IJ SOLN
INTRAMUSCULAR | Status: AC
  Filled 2015-11-01: qty 5

## 2015-11-01 MED ORDER — LEVOTHYROXINE SODIUM 50 MCG PO TABS
75.0000 ug | ORAL_TABLET | Freq: Every day | ORAL | Status: DC
  Administered 2015-11-02: 75 ug via ORAL
  Filled 2015-11-01: qty 1

## 2015-11-01 MED ORDER — HYDROMORPHONE HCL 1 MG/ML IJ SOLN
INTRAMUSCULAR | Status: DC | PRN
  Administered 2015-11-01: 0.5 mg via INTRAVENOUS
  Administered 2015-11-01: 1 mg via INTRAVENOUS
  Administered 2015-11-01: 0.5 mg via INTRAVENOUS

## 2015-11-01 MED ORDER — OXYCODONE-ACETAMINOPHEN 10-325 MG PO TABS: 1.0000 | ORAL_TABLET | Freq: Three times a day (TID) | ORAL | 0 refills | Status: DC | PRN

## 2015-11-01 MED ORDER — DIAZEPAM 5 MG PO TABS
5.0000 mg | ORAL_TABLET | Freq: Three times a day (TID) | ORAL | Status: DC | PRN
  Administered 2015-11-02: 5 mg via ORAL
  Filled 2015-11-01: qty 1

## 2015-11-01 MED ORDER — PHENYLEPHRINE HCL 10 MG/ML IJ SOLN
INTRAMUSCULAR | Status: DC | PRN
  Administered 2015-11-01: 80 ug via INTRAVENOUS

## 2015-11-01 MED ORDER — ONDANSETRON HCL 4 MG/2ML IJ SOLN
4.0000 mg | INTRAMUSCULAR | Status: DC | PRN
Start: 2015-11-01 — End: 2015-11-02

## 2015-11-01 MED ORDER — HYDROMORPHONE HCL 1 MG/ML IJ SOLN
INTRAMUSCULAR | Status: AC
  Filled 2015-11-01: qty 1

## 2015-11-01 MED ORDER — ACETAMINOPHEN 10 MG/ML IV SOLN
INTRAVENOUS | Status: DC | PRN
  Administered 2015-11-01: 1000 mg via INTRAVENOUS

## 2015-11-01 MED ORDER — DULOXETINE HCL 60 MG PO CPEP
60.0000 mg | ORAL_CAPSULE | Freq: Every day | ORAL | Status: DC
  Administered 2015-11-01 – 2015-11-02 (×2): 60 mg via ORAL
  Filled 2015-11-01 (×2): qty 1

## 2015-11-01 MED ORDER — HYDROMORPHONE HCL 1 MG/ML IJ SOLN
0.2500 mg | INTRAMUSCULAR | Status: DC | PRN
  Administered 2015-11-01 (×2): 0.5 mg via INTRAVENOUS

## 2015-11-01 MED ORDER — PROPOFOL 10 MG/ML IV BOLUS
INTRAVENOUS | Status: DC | PRN
  Administered 2015-11-01: 200 mg via INTRAVENOUS

## 2015-11-01 MED ORDER — LIDOCAINE HCL (CARDIAC) 20 MG/ML IV SOLN
INTRAVENOUS | Status: DC | PRN
  Administered 2015-11-01: 100 mg via INTRAVENOUS

## 2015-11-01 MED ORDER — MEPERIDINE HCL 50 MG/ML IJ SOLN: 6.2500 mg | INTRAMUSCULAR | Status: DC | PRN

## 2015-11-01 MED ORDER — GEMFIBROZIL 600 MG PO TABS
600.0000 mg | ORAL_TABLET | Freq: Two times a day (BID) | ORAL | Status: DC
  Administered 2015-11-01 – 2015-11-02 (×2): 600 mg via ORAL
  Filled 2015-11-01 (×2): qty 1

## 2015-11-01 MED ORDER — TOPIRAMATE 100 MG PO TABS
100.0000 mg | ORAL_TABLET | Freq: Two times a day (BID) | ORAL | Status: DC
  Administered 2015-11-01 – 2015-11-02 (×3): 100 mg via ORAL
  Filled 2015-11-01 (×3): qty 1

## 2015-11-01 MED ORDER — ROCURONIUM BROMIDE 100 MG/10ML IV SOLN
INTRAVENOUS | Status: DC | PRN
  Administered 2015-11-01 (×3): 10 mg via INTRAVENOUS
  Administered 2015-11-01: 40 mg via INTRAVENOUS
  Administered 2015-11-01 (×3): 10 mg via INTRAVENOUS

## 2015-11-01 MED ORDER — MORPHINE SULFATE (PF) 2 MG/ML IV SOLN: 2.0000 mg | INTRAVENOUS | Status: DC | PRN

## 2015-11-01 MED ORDER — ALLOPURINOL 100 MG PO TABS
100.0000 mg | ORAL_TABLET | Freq: Every day | ORAL | Status: DC
  Administered 2015-11-01: 100 mg via ORAL
  Filled 2015-11-01: qty 1

## 2015-11-01 MED ORDER — FENTANYL CITRATE (PF) 100 MCG/2ML IJ SOLN
INTRAMUSCULAR | Status: DC | PRN
  Administered 2015-11-01 (×3): 50 ug via INTRAVENOUS
  Administered 2015-11-01: 100 ug via INTRAVENOUS

## 2015-11-01 MED ORDER — ACETAMINOPHEN 325 MG PO TABS: 650.0000 mg | ORAL_TABLET | ORAL | Status: DC | PRN

## 2015-11-01 MED ORDER — MIDAZOLAM HCL 2 MG/2ML IJ SOLN
INTRAMUSCULAR | Status: AC
Start: 2015-11-01 — End: 2015-11-01
  Filled 2015-11-01: qty 2

## 2015-11-01 MED ORDER — ONDANSETRON HCL 4 MG/2ML IJ SOLN
INTRAMUSCULAR | Status: AC
Start: 2015-11-01 — End: 2015-11-01
  Filled 2015-11-01: qty 2

## 2015-11-01 SURGICAL SUPPLY — 43 items
BAG URINE DRAINAGE (UROLOGICAL SUPPLIES) ×3 IMPLANT
BLADE SURG 15 STRL LF DISP TIS (BLADE) ×1 IMPLANT
BLADE SURG 15 STRL SS (BLADE) ×2
CATH FOLEY 2WAY SLVR  5CC 14FR (CATHETERS) ×2
CATH FOLEY 2WAY SLVR 5CC 14FR (CATHETERS) ×1 IMPLANT
COVER MAYO STAND STRL (DRAPES) IMPLANT
COVER SURGICAL LIGHT HANDLE (MISCELLANEOUS) ×3 IMPLANT
DRAIN PENROSE 18X1/4 LTX STRL (WOUND CARE) ×3 IMPLANT
DRAPE SHEET LG 3/4 BI-LAMINATE (DRAPES) ×3 IMPLANT
ELECT PENCIL ROCKER SW 15FT (MISCELLANEOUS) ×3 IMPLANT
GAUZE PACKING 2X5 YD STRL (GAUZE/BANDAGES/DRESSINGS) ×3 IMPLANT
GAUZE SPONGE 4X4 16PLY XRAY LF (GAUZE/BANDAGES/DRESSINGS) ×15 IMPLANT
GLOVE BIO SURGEON STRL SZ 6.5 (GLOVE) ×2 IMPLANT
GLOVE BIO SURGEONS STRL SZ 6.5 (GLOVE) ×1
GLOVE BIOGEL M STRL SZ7.5 (GLOVE) ×3 IMPLANT
GLOVE ECLIPSE 8.5 STRL (GLOVE) ×3 IMPLANT
GOWN STRL REUS W/TWL XL LVL3 (GOWN DISPOSABLE) ×3 IMPLANT
HOLDER FOLEY CATH W/STRAP (MISCELLANEOUS) ×3 IMPLANT
IV NS 1000ML (IV SOLUTION) ×2
IV NS 1000ML BAXH (IV SOLUTION) ×1 IMPLANT
KIT BASIN OR (CUSTOM PROCEDURE TRAY) ×3 IMPLANT
NEEDLE HYPO 22GX1.5 SAFETY (NEEDLE) ×3 IMPLANT
NEEDLE MAYO 6 CRC TAPER PT (NEEDLE) ×3 IMPLANT
PACK CYSTO (CUSTOM PROCEDURE TRAY) ×3 IMPLANT
PLUG CATH AND CAP STER (CATHETERS) ×3 IMPLANT
RETRACTOR STAY HOOK 5MM (MISCELLANEOUS) ×3 IMPLANT
SHEET LAVH (DRAPES) ×3 IMPLANT
SUT VIC AB 0 CT1 27 (SUTURE) ×2
SUT VIC AB 0 CT1 27XBRD ANTBC (SUTURE) ×1 IMPLANT
SUT VIC AB 2-0 CT1 27 (SUTURE) ×4
SUT VIC AB 2-0 CT1 27XBRD (SUTURE) ×2 IMPLANT
SUT VIC AB 2-0 SH 27 (SUTURE) ×8
SUT VIC AB 2-0 SH 27X BRD (SUTURE) ×4 IMPLANT
SUT VIC AB 3-0 SH 27 (SUTURE) ×4
SUT VIC AB 3-0 SH 27XBRD (SUTURE) ×2 IMPLANT
SUT VICRYL 0 UR6 27IN ABS (SUTURE) ×6 IMPLANT
SYRINGE 10CC LL (SYRINGE) ×3 IMPLANT
TOWEL OR 17X26 10 PK STRL BLUE (TOWEL DISPOSABLE) ×3 IMPLANT
TOWEL OR NON WOVEN STRL DISP B (DISPOSABLE) ×3 IMPLANT
TUBING CONNECTING 10 (TUBING) ×2 IMPLANT
TUBING CONNECTING 10' (TUBING) ×1
WATER STERILE IRR 1500ML POUR (IV SOLUTION) ×3 IMPLANT
YANKAUER SUCT BULB TIP 10FT TU (MISCELLANEOUS) ×3 IMPLANT

## 2015-11-01 NOTE — Anesthesia Postprocedure Evaluation (Signed)
Anesthesia Post Note  Patient: Holly Espinoza  Procedure(s) Performed: Procedure(s) (LRB): CYSTOSCOPY, ANTERIOR (CYSTOCELE) AND POSTERIOR REPAIR (RECTOCELE) (N/A)  Patient location during evaluation: PACU Anesthesia Type: General Level of consciousness: awake and alert Pain management: pain level controlled Vital Signs Assessment: post-procedure vital signs reviewed and stable Respiratory status: spontaneous breathing, nonlabored ventilation, respiratory function stable and patient connected to nasal cannula oxygen Cardiovascular status: blood pressure returned to baseline and stable Postop Assessment: no signs of nausea or vomiting Anesthetic complications: no    Last Vitals:  Vitals:   11/01/15 1143 11/01/15 1155  BP: 96/61 (!) 117/54  Pulse: 74 72  Resp: 14 14  Temp:  36.4 C    Last Pain:  Vitals:   11/01/15 1120  TempSrc:   PainSc: 5                  [name]

## 2015-11-01 NOTE — Interval H&P Note (Signed)
History and Physical Interval Note:  11/01/2015 7:06 AM  Holly Espinoza  has presented today for surgery, with the diagnosis of cystocele, rectocele and vault prolapse  The various methods of treatment have been discussed with the patient and family. After consideration of risks, benefits and other options for treatment, the patient has consented to  Procedure(s): CYSTOSCOPY, ANTERIOR (CYSTOCELE) AND POSTERIOR REPAIR (RECTOCELE) (N/A) REPAIR OF VAULT PROLAPSE AND GRAFT (N/A) as a surgical intervention .  The patient's history has been reviewed, patient examined, no change in status, stable for surgery.  I have reviewed the patient's chart and labs.  Questions were answered to the patient's satisfaction.     [name] A

## 2015-11-01 NOTE — Progress Notes (Signed)
MD MacDiarmid made aware of patient's BP 86/60, HR 66. No new orders at this time. Patient denies weakness or dizziness, c/o chronic back pain. Pt educated that pain medication cannot be given at this time d/t low BP. Labs ordered to be drawn- will assess Hgb. Pt has had a small amount of bright red blood from vaginal area. MD aware. Patient falls back to sleep very quickly. Will continue to monitor.

## 2015-11-01 NOTE — Progress Notes (Signed)
Day of Surgery Subjective: The patient is doing well.  No nausea or vomiting. Pain is fairly controlled. BP was low in 123XX123 systolics but not tachycardic with stable Hb. Currently asymptomatic.  Objective: Vital signs in last 24 hours: Temp:  [97.6 F (36.4 C)-98.9 F (37.2 C)] 97.6 F (36.4 C) (08/22 1155) Pulse Rate:  [66-79] 66 (08/22 1548) Resp:  [13-23] 14 (08/22 1548) BP: (86-117)/(54-61) 86/60 (08/22 1548) SpO2:  [96 %-100 %] 96 % (08/22 1548) Weight:  [92 kg (202 lb 12.8 oz)] 92 kg (202 lb 12.8 oz) (08/22 1418)  Intake/Output from previous day: No intake/output data recorded. Intake/Output this shift: Total I/O In: 2612.5 [I.V.:2612.5] Out: 75 [Blood:75]  Physical Exam:  General: Alert and oriented. GI: Soft, Nondistended. Vagina: No hematoma or significant vaginal bleeding noted Urine: orange due to pyridium  Lab Results:  Recent Labs  11/01/15 1557  HGB 10.7*  HCT 33.1*          Recent Labs  10/26/15 1500  CREATININE 1.12*           Results for orders placed or performed during the hospital encounter of 11/01/15 (from the past 24 hour(s))  Glucose, capillary     Status: Abnormal   Collection Time: 11/01/15  5:58 AM  Result Value Ref Range   Glucose-Capillary 114 (H) 65 - 99 mg/dL  Glucose, capillary     Status: Abnormal   Collection Time: 11/01/15 10:59 AM  Result Value Ref Range   Glucose-Capillary 104 (H) 65 - 99 mg/dL  Hemoglobin and hematocrit, blood     Status: Abnormal   Collection Time: 11/01/15  3:57 PM  Result Value Ref Range   Hemoglobin 10.7 (L) 12.0 - 15.0 g/dL   HCT 33.1 (L) 36.0 - 46.0 %    Assessment/Plan: POD# 0 s/p cystocele and rectocele repair.  1) Ambulate, Incentive spirometry 2) Advance diet as tolerated 3) Ok to monitor systolics in 123XX123 as long as asymptomatic 4) Ok to continue to given pain medication   LOS: 0 days   Lolita Rieger 11/01/2015, 4:50 PM

## 2015-11-01 NOTE — Op Note (Signed)
Preoperative diagnosis: Cystocele, rectocele, and mild vault prolapse Postoperative diagnosis, cystocele, rectocele, and mild vault prolapse Surgery: Cystocele repair and rectocele repair and cystoscopy Surgeon: Dr. Nicki Reaper Joshuwa Vecchio Assistant: Debbrah Alar  The patient has the above diagnoses and consented to the above procedure. Extra care was taken with leg positioning to minimize the risk of compartment syndrome and neuropathy and deep vein thrombosis. Preoperative antibiotics were given. On inspection she had a moderate grade 2 cystocele with a little bit of a trapdoor deformity and not a large central defect. The vaginal cuff was very well supported. She had deficiency of the posterior fourchette and a diffuse grade 2 rectocele  I placed a 3-0 Vicryl at the cuff. It was difficult to use my Allis clamps at the apex due to her excellent support but a T-shaped incision was made and I sharply mobilized the overlying vaginal wall mucosa from the underlying pubocervical fascia to the white line bilaterally. I was pleased with my apical mobilization. She did not have a lot of length and she needed a lot of careful dissection to give her enough of the cystocele to reduce and repair  Without imbricating the bladder neck I did a 2 layer anterior repair. I cystoscoped the patient. There is no injury to the bladder. Cystoscopically she had an excellent repair. Trigone and ureters were normal. There was excellent efflux bilaterally.  I trimmed a minimal amount of vaginal wall mucosa and closed the anterior vaginal wall with running 2-0 Vicryl and CT1 needle. She had excellent support and length  Posteriorly I placed an Allis clamp on the hymenal ring. She had almost a divot just inside the introitus from previous surgery. I removed a small triangle of perineal skin. I made a long posterior vaginal wall incision and sharply mobilized the underlying rectovaginal fascia from the overlying vaginal mucosa. I was  very happy with the mobilization laterally and at the apex.  Rectally she had a diffuse grade 2 rectocele on rectal exam. I did a 2 layer repair and was very pleased with it. Length of the posterior vaginal wall was preserved. Rectal examination noted a good repair with no rectal injury or suture  I trimmed only a few millimeters of posterior vaginal wall mucosa and closed it with running 2-0 Vicryl on a CT1 exteriorizing the suture at the introitus. A gentle perineal repair for the deficiency of the posterior fourchette was done with interrupted 0 Vicryl 2. Subcuticular suture was utilized for the perineum.  She had excellent support anteriorly and posteriorly very good length. Blood loss was less than 100 L. Leg position was excellent. Foley catheter was draining clear urine. Patient was taken to recovery after a vaginal pack was placed. I was very pleased with the surgery and hopefully it will meet her treatment goal

## 2015-11-01 NOTE — Anesthesia Preprocedure Evaluation (Addendum)
Anesthesia Evaluation  Patient identified by MRN, date of birth, ID band Patient awake    Reviewed: Allergy & Precautions, H&P , NPO status , Patient's Chart, lab work & pertinent test results  Airway Mallampati: I  TM Distance: >3 FB Neck ROM: full    Dental no notable dental hx. (+) Partial Lower, Partial Upper, Dental Advisory Given   Pulmonary Current Smoker,    Pulmonary exam normal breath sounds clear to auscultation       Cardiovascular hypertension, Normal cardiovascular exam Rhythm:regular Rate:Normal     Neuro/Psych PSYCHIATRIC DISORDERS negative neurological ROS  negative psych ROS   GI/Hepatic negative GI ROS, Neg liver ROS,   Endo/Other  diabetes, Type 2, Oral Hypoglycemic Agents  Renal/GU negative Renal ROS  negative genitourinary   Musculoskeletal negative musculoskeletal ROS (+)   Abdominal   Peds negative pediatric ROS (+)  Hematology negative hematology ROS (+) anemia ,   Anesthesia Other Findings Chronic opioids  Reproductive/Obstetrics negative OB ROS                          Anesthesia Physical  Anesthesia Plan  ASA: III  Anesthesia Plan: General   Post-op Pain Management:    Induction: Intravenous  Airway Management Planned: Oral ETT  Additional Equipment:   Intra-op Plan:   Post-operative Plan: Extubation in OR  Informed Consent: I have reviewed the patients History and Physical, chart, labs and discussed the procedure including the risks, benefits and alternatives for the proposed anesthesia with the patient or authorized representative who has indicated his/her understanding and acceptance.     Plan Discussed with: CRNA, Anesthesiologist and Surgeon  Anesthesia Plan Comments: (ETT with paralysis)        Anesthesia Quick Evaluation

## 2015-11-01 NOTE — Transfer of Care (Signed)
Immediate Anesthesia Transfer of Care Note  Patient: Holly Espinoza  Procedure(s) Performed: Procedure(s): CYSTOSCOPY, ANTERIOR (CYSTOCELE) AND POSTERIOR REPAIR (RECTOCELE) (N/A)  Patient Location: PACU  Anesthesia Type:General  Level of Consciousness: awake, alert  and oriented  Airway & Oxygen Therapy: Patient Spontanous Breathing and Patient connected to face mask oxygen  Post-op Assessment: Report given to RN and Post -op Vital signs reviewed and stable  Post vital signs: Reviewed and stable  Last Vitals:  Vitals:   11/01/15 0548  BP: (!) 107/57  Pulse: 72  Resp: 16  Temp: 37 C    Last Pain:  Vitals:   11/01/15 0548  TempSrc: Oral         Complications: No apparent anesthesia complications

## 2015-11-01 NOTE — Anesthesia Procedure Notes (Signed)
Procedure Name: Intubation Date/Time: 11/01/2015 7:39 AM Performed by: Noralyn Pick D Pre-anesthesia Checklist: Patient identified, Emergency Drugs available, Suction available and Patient being monitored Patient Re-evaluated:Patient Re-evaluated prior to inductionOxygen Delivery Method: Circle system utilized Preoxygenation: Pre-oxygenation with 100% oxygen Intubation Type: IV induction and Rapid sequence Ventilation: Mask ventilation without difficulty Laryngoscope Size: 3 and Mac (By Pearline Cables EMT student) Grade View: Grade I Tube type: Oral Tube size: 7.0 mm Number of attempts: 1 Airway Equipment and Method: Stylet and Oral airway Placement Confirmation: ETT inserted through vocal cords under direct vision,  positive ETCO2 and breath sounds checked- equal and bilateral Secured at: 21 cm Tube secured with: Tape Dental Injury: Teeth and Oropharynx as per pre-operative assessment

## 2015-11-02 ENCOUNTER — Encounter (HOSPITAL_COMMUNITY): Payer: Self-pay | Admitting: Urology

## 2015-11-02 DIAGNOSIS — N8110 Cystocele, unspecified: Secondary | ICD-10-CM | POA: Diagnosis not present

## 2015-11-02 LAB — HEMOGLOBIN AND HEMATOCRIT, BLOOD
HCT: 30.9 % — ABNORMAL LOW (ref 36.0–46.0)
Hemoglobin: 10 g/dL — ABNORMAL LOW (ref 12.0–15.0)

## 2015-11-02 LAB — BASIC METABOLIC PANEL
Anion gap: 3 — ABNORMAL LOW (ref 5–15)
BUN: 9 mg/dL (ref 6–20)
CO2: 22 mmol/L (ref 22–32)
Calcium: 8.5 mg/dL — ABNORMAL LOW (ref 8.9–10.3)
Chloride: 114 mmol/L — ABNORMAL HIGH (ref 101–111)
Creatinine, Ser: 0.83 mg/dL (ref 0.44–1.00)
GFR calc Af Amer: >60 mL/min (ref >60)
GFR calc non Af Amer: >60 mL/min (ref >60)
Glucose, Bld: 96 mg/dL (ref 65–99)
Potassium: 3.7 mmol/L (ref 3.5–5.1)
Sodium: 139 mmol/L (ref 135–145)

## 2015-11-02 NOTE — Progress Notes (Signed)
Vaginal packing and foley d/c per MD order.  Patient tolerated well.  Patient instructed to void in hat and call RN/NT for bladder scan to measure PVR.  Patient verbalizes understanding.

## 2015-11-02 NOTE — Progress Notes (Signed)
Labs OK Vitals OK Said she just feels weak and had a lousy night Belly benign Minimal spotting See orders \\Instructions  detailed

## 2015-11-02 NOTE — Progress Notes (Signed)
Patient stated she was unable to void.  Bladder scan performed, showed approx 400 ml in bladder.  Upon request patient attempted to void again and was able to void 400 ml of clear yellow urine.  Dr. Raynald Kemp informed.  MD states patient is ok to discharge. Will continue with discharging patient.

## 2015-11-02 NOTE — Discharge Summary (Signed)
Date of admission: 11/01/2015  Date of discharge: 11/02/2015  Admission diagnosis: cystocele  Discharge diagnosis: cystocle  Secondary diagnoses: rectocele  History and Physical: For full details, please see admission history and physical. Briefly, Holly Espinoza is a 55 y.o. year old patient with the above diagnosis.   Hospital Course: anterior and posterior repair went very well. Post op course uneventful. Patient is on chronic pain medications  Laboratory values:  Recent Labs  11/01/15 1557 11/02/15 0512  HGB 10.7* 10.0*  HCT 33.1* 30.9*    Recent Labs  11/02/15 0512  CREATININE 0.83    Disposition: Home  Discharge instruction: The patient was instructed to be ambulatory but told to refrain from heavy lifting, strenuous activity, or driving. Detailed  Discharge medications:    Medication List    STOP taking these medications   aspirin 81 MG tablet   cyanocobalamin 1000 MCG/ML injection Commonly known as:  (VITAMIN B-12)     TAKE these medications   allopurinol 100 MG tablet Commonly known as:  ZYLOPRIM Take 100 mg by mouth at bedtime.   BYDUREON 2 MG Pen Generic drug:  Exenatide ER Inject 2 mg into the skin once a week. Friday.   diazepam 5 MG tablet Commonly known as:  VALIUM Take 5 mg by mouth every 8 (eight) hours as needed for anxiety or muscle spasms.   DULoxetine 60 MG capsule Commonly known as:  CYMBALTA Take 60 mg by mouth daily.   furosemide 20 MG tablet Commonly known as:  LASIX Take 20 mg by mouth daily as needed for fluid or edema. Reported on 08/05/2015   gemfibrozil 600 MG tablet Commonly known as:  LOPID Take 600 mg by mouth 2 (two) times daily before a meal.   levothyroxine 75 MCG tablet Commonly known as:  SYNTHROID, LEVOTHROID Take 75 mcg by mouth daily before breakfast.   mirtazapine 15 MG tablet Commonly known as:  REMERON Take 15 mg by mouth at bedtime.   omeprazole 40 MG capsule Commonly known as:  PRILOSEC Take 40 mg  by mouth daily.   ondansetron 4 MG tablet Commonly known as:  ZOFRAN Take 4 mg by mouth every 8 (eight) hours as needed for nausea. For nausea   oxyCODONE 10 mg 12 hr tablet Commonly known as:  OXYCONTIN Take 10 mg by mouth every 12 (twelve) hours.   oxyCODONE-acetaminophen 10-325 MG tablet Commonly known as:  PERCOCET Take 1 tablet by mouth every 8 (eight) hours as needed for pain. What changed:  when to take this  reasons to take this   topiramate 100 MG tablet Commonly known as:  TOPAMAX Take 100 mg by mouth 2 (two) times daily.       Followup:  Follow-up Information    Demiana Crumbley A, MD.   Specialty:  Urology Why:  office will call you with date and time of appointment Contact information: [place] [address] [phone]

## 2015-11-02 NOTE — Progress Notes (Signed)
1 Day Post-Op Subjective: The patient is doing well.  No nausea or vomiting. Pain is adequately controlled. Slept poorly last night. Labs stable this AM with stable vitals.  Objective: Vital signs in last 24 hours: Temp:  [97.6 F (36.4 C)-98.9 F (37.2 C)] 98.1 F (36.7 C) (08/23 0502) Pulse Rate:  [66-79] 73 (08/23 0502) Resp:  [13-23] 18 (08/23 0502) BP: (86-117)/(54-69) 90/60 (08/23 0502) SpO2:  [94 %-100 %] 94 % (08/23 0502) Weight:  [92 kg (202 lb 12.8 oz)] 92 kg (202 lb 12.8 oz) (08/22 1418)  Intake/Output from previous day: 08/22 0701 - 08/23 0700 In: 4852.5 [P.O.:240; I.V.:4612.5] Out: 2450 [Urine:2375; Blood:75] Intake/Output this shift: No intake/output data recorded.  Physical Exam:  General: Alert and oriented. GI: Soft, Nondistended. Urine: Clear  Lab Results:  Recent Labs  11/01/15 1557 11/02/15 0512  HGB 10.7* 10.0*  HCT 33.1* 30.9*          Recent Labs  10/26/15 1500 11/02/15 0512  CREATININE 1.12* 0.83           Results for orders placed or performed during the hospital encounter of 11/01/15 (from the past 24 hour(s))  Glucose, capillary     Status: Abnormal   Collection Time: 11/01/15 10:59 AM  Result Value Ref Range   Glucose-Capillary 104 (H) 65 - 99 mg/dL  Hemoglobin and hematocrit, blood     Status: Abnormal   Collection Time: 11/01/15  3:57 PM  Result Value Ref Range   Hemoglobin 10.7 (L) 12.0 - 15.0 g/dL   HCT 33.1 (L) 36.0 - [id number] %  Basic metabolic panel     Status: Abnormal   Collection Time: 11/02/15  5:12 AM  Result Value Ref Range   Sodium 139 135 - 145 mmol/L   Potassium 3.7 3.5 - 5.1 mmol/L   Chloride 114 (H) 101 - 111 mmol/L   CO2 22 22 - 32 mmol/L   Glucose, Bld 96 65 - 99 mg/dL   BUN 9 6 - 20 mg/dL   Creatinine, Ser 0.83 0.44 - 1.00 mg/dL   Calcium 8.5 (L) 8.9 - 10.3 mg/dL   GFR calc non Af Amer >60 >60 mL/min   GFR calc Af Amer >60 >60 mL/min   Anion gap 3 (L) 5 - 15  Hemoglobin and hematocrit, blood     Status:  Abnormal   Collection Time: 11/02/15  5:12 AM  Result Value Ref Range   Hemoglobin 10.0 (L) 12.0 - 15.0 g/dL   HCT 30.9 (L) 36.0 - 46.0 %    Assessment/Plan: POD# 0 s/p cystocele and rectocele repair. Packing removed this AM  1) Ambulate, Incentive spirometry 2) Advance diet as tolerated 3) D/C urethral catheter and check PVRs 4) Possible d/c later this morning    LOS: 0 days   Lolita Rieger 11/02/2015, 7:58 AM

## 2015-11-02 NOTE — Discharge Instructions (Signed)
I have reviewed discharge instructions in detail with the patient. They will follow-up with me or their physician as scheduled. My nurse will also be calling the patients as per protocol. As discussed with Dr. Dorsie Sethi. ° °You may resume aspirin, advil, aleve, vitamins, and supplements 7 days after surgery. °

## 2015-12-28 ENCOUNTER — Telehealth: Payer: Self-pay

## 2015-12-29 NOTE — Telephone Encounter (Signed)
The patient will have her mammograms at the [facility].

## 2016-01-04 ENCOUNTER — Other Ambulatory Visit: Payer: Self-pay

## 2016-01-04 DIAGNOSIS — D0502 Lobular carcinoma in situ of left breast: Secondary | ICD-10-CM

## 2016-01-11 ENCOUNTER — Ambulatory Visit
Admission: RE | Admit: 2016-01-11 | Discharge: 2016-01-11 | Disposition: A | Discharge status: Home / Self Care | Payer: Self-pay | Source: Ambulatory Visit | Attending: *Deleted | Admitting: *Deleted

## 2016-01-11 ENCOUNTER — Other Ambulatory Visit: Payer: Self-pay | Admitting: *Deleted

## 2016-01-11 DIAGNOSIS — Z9289 Personal history of other medical treatment: Secondary | ICD-10-CM

## 2016-02-27 ENCOUNTER — Other Ambulatory Visit: Payer: Self-pay | Admitting: Nurse Practitioner

## 2016-02-27 DIAGNOSIS — M25511 Pain in right shoulder: Secondary | ICD-10-CM

## 2016-02-28 ENCOUNTER — Encounter: Payer: Self-pay | Admitting: *Deleted

## 2016-03-01 ENCOUNTER — Ambulatory Visit: Admission: RE | Admit: 2016-03-01 | Payer: Medicaid Other | Source: Ambulatory Visit

## 2016-03-01 ENCOUNTER — Other Ambulatory Visit: Payer: Medicaid Other

## 2016-03-07 ENCOUNTER — Ambulatory Visit: Payer: Medicaid Other | Admitting: General Surgery

## 2016-03-07 ENCOUNTER — Telehealth: Payer: Self-pay

## 2016-03-07 NOTE — Telephone Encounter (Signed)
Patient scheduled for her mammogram at [facility] on 03/23/16 at 2:00 pm.

## 2016-03-08 ENCOUNTER — Ambulatory Visit
Admission: RE | Admit: 2016-03-08 | Discharge: 2016-03-08 | Disposition: A | Discharge status: Home / Self Care | Payer: Medicaid Other | Source: Ambulatory Visit | Attending: Nurse Practitioner | Admitting: Nurse Practitioner

## 2016-03-08 DIAGNOSIS — M75111 Incomplete rotator cuff tear or rupture of right shoulder, not specified as traumatic: Secondary | ICD-10-CM | POA: Diagnosis not present

## 2016-03-08 DIAGNOSIS — M25511 Pain in right shoulder: Secondary | ICD-10-CM | POA: Insufficient documentation

## 2016-03-22 ENCOUNTER — Encounter: Payer: Self-pay | Admitting: *Deleted

## 2016-03-23 ENCOUNTER — Ambulatory Visit
Admission: RE | Admit: 2016-03-23 | Discharge: 2016-03-23 | Disposition: A | Discharge status: Home / Self Care | Payer: Medicaid Other | Source: Ambulatory Visit | Attending: General Surgery | Admitting: General Surgery

## 2016-03-23 DIAGNOSIS — D0502 Lobular carcinoma in situ of left breast: Secondary | ICD-10-CM

## 2016-03-23 HISTORY — DX: Malignant neoplasm of unspecified site of unspecified female breast: C50.919

## 2016-03-28 ENCOUNTER — Ambulatory Visit: Payer: Medicaid Other | Admitting: General Surgery

## 2016-04-03 ENCOUNTER — Encounter: Payer: Self-pay | Admitting: General Surgery

## 2016-04-03 ENCOUNTER — Ambulatory Visit (INDEPENDENT_AMBULATORY_CARE_PROVIDER_SITE_OTHER): Payer: Medicaid Other | Admitting: General Surgery

## 2016-04-03 VITALS — BP 130/78 | HR 74 | Resp 12 | Ht 68.0 in | Wt 191.0 lb

## 2016-04-03 DIAGNOSIS — D0502 Lobular carcinoma in situ of left breast: Secondary | ICD-10-CM | POA: Diagnosis not present

## 2016-04-03 NOTE — Patient Instructions (Signed)
Patient will be asked to return to the office in 1 year with a bilateral screening mammogram with Dr. Bary Castilla.

## 2016-04-03 NOTE — Progress Notes (Signed)
Patient ID: Holly Espinoza, female   DOB: Mar 22, 1960, 56 y.o.   MRN: [id number]  Chief Complaint  Patient presents with  . Follow-up    HPI Holly Espinoza is a 56 y.o. female who presents for a breast evaluation. The most recent mammogram was done on  03/23/2016.  Patient does perform regular self breast checks and gets regular mammograms done.   I have reviewed the history of present illness with the patient.  HPI  Past Medical History:  Diagnosis Date  . Anxiety   . Arthritis    "pretty much from my neck to my feet" (07/08/2012)  . B12 deficiency anemia   . Chronic lower back pain   . Depression   . Diabetic peripheral neuropathy ([place])   . Gout   . High cholesterol   . Neuromuscular disorder ([place])   . Rheumatoid arthritis ([place])   . Type II diabetes mellitus ([facility])    recent hgb A1C was 5.4 per Dr. Humphrey Rolls, [place], [place]    Past Surgical History:  Procedure Laterality Date  . ABDOMINAL HYSTERECTOMY  2001?  . ANTERIOR AND POSTERIOR REPAIR N/A 11/01/2015   Procedure: CYSTOSCOPY, ANTERIOR (CYSTOCELE) AND POSTERIOR REPAIR (RECTOCELE);  Surgeon: Bjorn Loser, MD;  Location: [facility];  Service: Urology;  Laterality: N/A;  . ANTERIOR CERVICAL DECOMP/DISCECTOMY FUSION  2012  . BACK SURGERY  2001   laminectomy  . BREAST BIOPSY Left 2014  . BREAST SURGERY Left 02/25/13   lumpectomy  . CHOLECYSTECTOMY  1980's  . DILATION AND CURETTAGE OF UTERUS  [phone]   "probably 3" (07/08/2012)  . FRACTURE SURGERY     C4-7 Surgery for fracture  . LUMBAR WOUND DEBRIDEMENT N/A 08/12/2012   Procedure: LUMBAR WOUND DEBRIDEMENT;  Surgeon: Faythe Ghee, MD;  Location: [facility];  Service: Neurosurgery;  Laterality: N/A;  Lumbar Wound Debridement  . POSTERIOR LUMBAR FUSION  07/08/2012  . TONSILLECTOMY  ~ 1968    Family History  Problem Relation Age of Onset  . Kidney disease Father   . Cancer Other     breast great grandmother    Social History Social History  Substance Use Topics  .  Smoking status: Current Every Day Smoker    Packs/day: 0.50    Years: 40.00    Types: Cigarettes  . Smokeless tobacco: Never Used  . Alcohol use No    No Known Allergies  Current Outpatient Prescriptions  Medication Sig Dispense Refill  . allopurinol (ZYLOPRIM) 100 MG tablet Take 200 mg by mouth at bedtime.     Marland Kitchen aspirin 81 MG chewable tablet Chew 81 mg by mouth daily.    . diazepam (VALIUM) 5 MG tablet Take 5 mg by mouth as needed for anxiety or muscle spasms.     . DULoxetine (CYMBALTA) 60 MG capsule Take 60 mg by mouth daily.    . Exenatide ER (BYDUREON) 2 MG PEN Inject 2 mg into the skin. Friday.    . furosemide (LASIX) 20 MG tablet Take 20 mg by mouth daily as needed for fluid or edema. Reported on 08/05/2015    . gemfibrozil (LOPID) 600 MG tablet Take 600 mg by mouth 2 (two) times daily before a meal.    . levothyroxine (SYNTHROID, LEVOTHROID) 75 MCG tablet Take 75 mcg by mouth daily before breakfast.    . mirtazapine (REMERON) 15 MG tablet Take 15 mg by mouth at bedtime.    Marland Kitchen omeprazole (PRILOSEC) 40 MG capsule Take 40 mg by mouth daily.    Marland Kitchen  ondansetron (ZOFRAN) 4 MG tablet Take 4 mg by mouth every 8 (eight) hours as needed for nausea. For nausea    . oxyCODONE (OXYCONTIN) 10 mg 12 hr tablet Take 10 mg by mouth every 12 (twelve) hours.    Marland Kitchen oxyCODONE-acetaminophen (PERCOCET) 10-325 MG tablet Take 1 tablet by mouth every 8 (eight) hours as needed for pain. 30 tablet 0  . topiramate (TOPAMAX) 100 MG tablet Take 100 mg by mouth 2 (two) times daily.     No current facility-administered medications for this visit.     Review of Systems Review of Systems  Constitutional: Negative.   Respiratory: Negative.   Cardiovascular: Negative.     Blood pressure 130/78, pulse 74, resp. rate 12, height 5\' 8"  (1.727 m), weight 191 lb (86.6 kg).  Physical Exam Physical Exam  Constitutional: She is oriented to person, place, and time. She appears well-developed and well-nourished.  Eyes:  Conjunctivae are normal. No scleral icterus.  Neck: Neck supple.  Cardiovascular: Normal rate, regular rhythm and normal heart sounds.   Pulmonary/Chest: Effort normal and breath sounds normal. Right breast exhibits no inverted nipple, no mass, no nipple discharge, no skin change and no tenderness. Left breast exhibits no inverted nipple, no mass, no nipple discharge, no skin change and no tenderness.  Abdominal: Soft. Bowel sounds are normal. There is no tenderness.  Lymphadenopathy:    She has no cervical adenopathy.    She has no axillary adenopathy.  Neurological: She is oriented to person, place, and time.  Skin: Skin is warm and dry.    Data Reviewed Mammogram reviewed   Assessment    LCIS left breast, s/p lumpectomy in 2014. Stable exam    Plan    Patient will be asked to return to the office in 1 year with a bilateral screening mammogram.         This information has been scribed by Gaspar Cola CMA.   Adewale Pucillo G 04/03/2016, 12:54 PM

## 2016-05-08 DIAGNOSIS — M7540 Impingement syndrome of unspecified shoulder: Secondary | ICD-10-CM | POA: Insufficient documentation

## 2016-05-22 ENCOUNTER — Ambulatory Visit: Payer: Medicaid Other | Admitting: Physical Therapy

## 2016-05-30 ENCOUNTER — Ambulatory Visit: Payer: Medicaid Other | Admitting: Physical Therapy

## 2016-12-24 ENCOUNTER — Other Ambulatory Visit: Payer: Self-pay | Admitting: Nurse Practitioner

## 2016-12-24 DIAGNOSIS — M542 Cervicalgia: Secondary | ICD-10-CM

## 2016-12-27 ENCOUNTER — Ambulatory Visit
Admission: RE | Admit: 2016-12-27 | Discharge: 2016-12-27 | Disposition: A | Discharge status: Home / Self Care | Payer: Medicaid Other | Source: Ambulatory Visit | Attending: Nurse Practitioner | Admitting: Nurse Practitioner

## 2016-12-27 ENCOUNTER — Other Ambulatory Visit: Payer: Self-pay | Admitting: Nurse Practitioner

## 2016-12-27 DIAGNOSIS — M25532 Pain in left wrist: Secondary | ICD-10-CM | POA: Diagnosis not present

## 2016-12-27 DIAGNOSIS — M542 Cervicalgia: Secondary | ICD-10-CM | POA: Diagnosis present

## 2016-12-27 DIAGNOSIS — M25432 Effusion, left wrist: Secondary | ICD-10-CM

## 2016-12-27 DIAGNOSIS — M189 Osteoarthritis of first carpometacarpal joint, unspecified: Secondary | ICD-10-CM | POA: Diagnosis not present

## 2017-01-14 ENCOUNTER — Other Ambulatory Visit: Payer: Self-pay | Admitting: Nurse Practitioner

## 2017-01-14 DIAGNOSIS — M542 Cervicalgia: Secondary | ICD-10-CM

## 2017-01-21 ENCOUNTER — Ambulatory Visit
Admission: RE | Admit: 2017-01-21 | Discharge: 2017-01-21 | Disposition: A | Discharge status: Home / Self Care | Payer: Medicaid Other | Source: Ambulatory Visit | Attending: Nurse Practitioner | Admitting: Nurse Practitioner

## 2017-01-21 DIAGNOSIS — M542 Cervicalgia: Secondary | ICD-10-CM | POA: Diagnosis not present

## 2017-01-21 DIAGNOSIS — Z981 Arthrodesis status: Secondary | ICD-10-CM | POA: Diagnosis not present

## 2017-01-21 DIAGNOSIS — G9589 Other specified diseases of spinal cord: Secondary | ICD-10-CM | POA: Diagnosis not present

## 2017-01-21 DIAGNOSIS — M50323 Other cervical disc degeneration at C6-C7 level: Secondary | ICD-10-CM | POA: Insufficient documentation

## 2017-01-24 ENCOUNTER — Other Ambulatory Visit: Payer: Self-pay

## 2017-01-24 DIAGNOSIS — Z1231 Encounter for screening mammogram for malignant neoplasm of breast: Secondary | ICD-10-CM

## 2017-03-15 ENCOUNTER — Other Ambulatory Visit: Payer: Self-pay | Admitting: Nurse Practitioner

## 2017-03-15 DIAGNOSIS — F410 Panic disorder [episodic paroxysmal anxiety] without agoraphobia: Secondary | ICD-10-CM

## 2017-03-15 DIAGNOSIS — F411 Generalized anxiety disorder: Principal | ICD-10-CM

## 2017-03-15 MED ORDER — DIAZEPAM 5 MG PO TABS: 5.0000 mg | ORAL_TABLET | Freq: Two times a day (BID) | ORAL | 1 refills | Status: DC | PRN

## 2017-03-15 NOTE — Progress Notes (Signed)
Refilled diazepam 5mg  bid for 30 days with 1 refill.

## 2017-04-04 ENCOUNTER — Ambulatory Visit: Payer: Self-pay | Admitting: General Surgery

## 2017-04-04 ENCOUNTER — Other Ambulatory Visit: Payer: Self-pay | Admitting: Nurse Practitioner

## 2017-04-04 ENCOUNTER — Ambulatory Visit: Payer: Self-pay | Admitting: Nurse Practitioner

## 2017-04-04 DIAGNOSIS — E039 Hypothyroidism, unspecified: Secondary | ICD-10-CM

## 2017-04-04 DIAGNOSIS — E119 Type 2 diabetes mellitus without complications: Secondary | ICD-10-CM

## 2017-04-04 DIAGNOSIS — F410 Panic disorder [episodic paroxysmal anxiety] without agoraphobia: Secondary | ICD-10-CM

## 2017-04-04 DIAGNOSIS — F411 Generalized anxiety disorder: Secondary | ICD-10-CM

## 2017-04-04 DIAGNOSIS — R609 Edema, unspecified: Secondary | ICD-10-CM

## 2017-04-04 DIAGNOSIS — E785 Hyperlipidemia, unspecified: Secondary | ICD-10-CM

## 2017-04-04 DIAGNOSIS — K219 Gastro-esophageal reflux disease without esophagitis: Secondary | ICD-10-CM

## 2017-04-04 MED ORDER — OMEPRAZOLE 40 MG PO CPDR: 40.0000 mg | DELAYED_RELEASE_CAPSULE | Freq: Every day | ORAL | 3 refills | Status: DC

## 2017-04-04 MED ORDER — FUROSEMIDE 20 MG PO TABS: 20.0000 mg | ORAL_TABLET | Freq: Every day | ORAL | 3 refills | Status: DC | PRN

## 2017-04-04 MED ORDER — GEMFIBROZIL 600 MG PO TABS: 600.0000 mg | ORAL_TABLET | Freq: Two times a day (BID) | ORAL | 3 refills | Status: DC

## 2017-04-04 MED ORDER — EXENATIDE ER 2 MG ~~LOC~~ PEN: 2.0000 mg | PEN_INJECTOR | SUBCUTANEOUS | 3 refills | Status: DC

## 2017-04-04 MED ORDER — DULOXETINE HCL 60 MG PO CPEP: 60.0000 mg | ORAL_CAPSULE | Freq: Every day | ORAL | 3 refills | Status: DC

## 2017-04-04 MED ORDER — MIRTAZAPINE 15 MG PO TABS: 15.0000 mg | ORAL_TABLET | Freq: Every day | ORAL | 3 refills | Status: DC

## 2017-04-04 MED ORDER — DIAZEPAM 5 MG PO TABS: 5.0000 mg | ORAL_TABLET | Freq: Two times a day (BID) | ORAL | 1 refills | Status: DC | PRN

## 2017-04-04 MED ORDER — ALLOPURINOL 100 MG PO TABS: 200.0000 mg | ORAL_TABLET | Freq: Every day | ORAL | 3 refills | Status: DC

## 2017-04-04 MED ORDER — LEVOTHYROXINE SODIUM 75 MCG PO TABS: 75.0000 ug | ORAL_TABLET | Freq: Every day | ORAL | 3 refills | Status: DC

## 2017-04-04 NOTE — Progress Notes (Signed)
Renewed all medications with 3 additional refills and sent to [facility]

## 2017-04-15 ENCOUNTER — Ambulatory Visit: Payer: Medicaid Other | Admitting: Nurse Practitioner

## 2017-04-15 ENCOUNTER — Encounter: Payer: Self-pay | Admitting: Nurse Practitioner

## 2017-04-15 VITALS — BP 121/61 | HR 79 | Temp 97.1°F | Resp 16 | Ht 70.0 in | Wt 213.8 lb

## 2017-04-15 DIAGNOSIS — R6889 Other general symptoms and signs: Secondary | ICD-10-CM | POA: Diagnosis not present

## 2017-04-15 DIAGNOSIS — E119 Type 2 diabetes mellitus without complications: Secondary | ICD-10-CM | POA: Diagnosis not present

## 2017-04-15 DIAGNOSIS — J0140 Acute pansinusitis, unspecified: Secondary | ICD-10-CM | POA: Diagnosis not present

## 2017-04-15 DIAGNOSIS — M542 Cervicalgia: Secondary | ICD-10-CM | POA: Diagnosis not present

## 2017-04-15 LAB — POCT INFLUENZA A/B: Influenza A, POC: NEGATIVE

## 2017-04-15 MED ORDER — PREDNISONE 10 MG (21) PO TBPK: ORAL_TABLET | ORAL | 0 refills | Status: DC

## 2017-04-15 MED ORDER — AZITHROMYCIN 250 MG PO TABS: ORAL_TABLET | ORAL | 0 refills | Status: DC

## 2017-04-15 NOTE — Progress Notes (Addendum)
[facility] [place], [place] [address]  Internal MEDICINE  Office Visit Note  Patient Name: Holly Espinoza  [id number]  [id number]  Date of Service: 04/15/2017  Chief Complaint  Patient presents with  . Sinusitis  . Sore Throat    going on few days  . Cough  . Headache  . Ear Pain     Sinusitis  This is a new problem. The current episode started 1 to 4 weeks ago. The problem has been gradually worsening since onset. There has been no fever. The pain is moderate. Associated symptoms include chills, congestion, coughing, ear pain, headaches, neck pain, shortness of breath, sinus pressure, sneezing and a sore throat. Past treatments include oral decongestants and acetaminophen (mucinex, allergy medication). The treatment provided no relief.   Pt is here for a sick visit.     Current Medication:  Outpatient Encounter Medications as of 04/15/2017  Medication Sig  . pregabalin (LYRICA) 50 MG capsule Take 50 mg by mouth 2 (two) times daily.  Marland Kitchen allopurinol (ZYLOPRIM) 100 MG tablet Take 2 tablets (200 mg total) by mouth at bedtime.  Marland Kitchen aspirin 81 MG chewable tablet Chew 81 mg by mouth daily.  Marland Kitchen azithromycin (ZITHROMAX) 250 MG tablet z-pack - take as directed for 5 days  . diazepam (VALIUM) 5 MG tablet Take 1 tablet (5 mg total) by mouth every 12 (twelve) hours as needed for anxiety or muscle spasms.  . DULoxetine (CYMBALTA) 60 MG capsule Take 1 capsule (60 mg total) by mouth daily.  . Exenatide ER (BYDUREON) 2 MG PEN Inject 2 mg into the skin once a week.  . furosemide (LASIX) 20 MG tablet Take 1 tablet (20 mg total) by mouth daily as needed for fluid or edema.  Marland Kitchen gemfibrozil (LOPID) 600 MG tablet Take 1 tablet (600 mg total) by mouth 2 (two) times daily before a meal.  . levothyroxine (SYNTHROID, LEVOTHROID) 75 MCG tablet Take 1 tablet (75 mcg total) by mouth daily before breakfast.  . mirtazapine (REMERON) 15 MG tablet Take 1 tablet (15 mg total) by mouth at  bedtime.  Marland Kitchen omeprazole (PRILOSEC) 40 MG capsule Take 1 capsule (40 mg total) by mouth daily.  . ondansetron (ZOFRAN) 4 MG tablet Take 4 mg by mouth every 8 (eight) hours as needed for nausea. For nausea  . oxyCODONE (OXYCONTIN) 10 mg 12 hr tablet Take 10 mg by mouth every 12 (twelve) hours.  Marland Kitchen oxyCODONE-acetaminophen (PERCOCET) 10-325 MG tablet Take 1 tablet by mouth every 8 (eight) hours as needed for pain.  . predniSONE (STERAPRED UNI-PAK 21 TAB) 10 MG (21) TBPK tablet 6 day taper - take by mouth as directed for 6 days  . topiramate (TOPAMAX) 100 MG tablet Take 100 mg by mouth 2 (two) times daily.   No facility-administered encounter medications on file as of 04/15/2017.       Medical History: Past Medical History:  Diagnosis Date  . Anxiety   . Arthritis    "pretty much from my neck to my feet" (07/08/2012)  . B12 deficiency anemia   . Chronic lower back pain   . Depression   . Diabetic peripheral neuropathy ([place])   . Gout   . High cholesterol   . 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 Neuromuscular disorder ([place])   . Rheumatoid arthritis ([place])   . Type II diabetes mellitus (HCC)    recent hgb A1C was 5.4 per Dr. Humphrey Rolls, [place], [place]  Today's Vitals   04/15/17 1137  BP: 121/61  Pulse: 79  Resp: 16  Temp: (!) 97.1 F (36.2 C)  SpO2: 96%  Weight: 213 lb 12.8 oz (97 kg)  Height: 5\' 10"  (1.778 m)    Review of Systems  Constitutional: Positive for activity change, chills and fatigue. Negative for fever.  HENT: Positive for congestion, ear pain, postnasal drip, sinus pressure, sinus pain, sneezing and sore throat.   Respiratory: Positive for cough, shortness of breath and wheezing.   Cardiovascular: Negative for chest pain and palpitations.  Gastrointestinal: Positive for nausea. Negative for constipation, diarrhea and vomiting.  Endocrine:       Blood sugars are doing ok   Musculoskeletal: Positive for neck pain.  Allergic/Immunologic:  Positive for environmental allergies.  Neurological: Positive for dizziness and headaches.  Psychiatric/Behavioral: The patient is nervous/anxious.     Physical Exam  Constitutional: She is oriented to person, place, and time. She appears well-developed and well-nourished. No distress.  HENT:  Head: Normocephalic and atraumatic.  Right Ear: There is tenderness. Tympanic membrane is erythematous and bulging.  Left Ear: There is tenderness. Tympanic membrane is erythematous and bulging.  Nose: Rhinorrhea present. Right sinus exhibits frontal sinus tenderness. Left sinus exhibits frontal sinus tenderness.  Mouth/Throat: Posterior oropharyngeal edema present. No oropharyngeal exudate.  Eyes: EOM are normal. Pupils are equal, round, and reactive to light.  Neck: Normal range of motion. Neck supple. No JVD present. No tracheal deviation present. No thyromegaly present.  Cardiovascular: Normal rate, regular rhythm and normal heart sounds. Exam reveals no gallop and no friction rub.  No murmur heard. Pulmonary/Chest: Effort normal. No respiratory distress. She has no wheezes. She has no rales. She exhibits no tenderness.  Abdominal: Soft. Bowel sounds are normal.  Musculoskeletal: Normal range of motion.  Lymphadenopathy:    She has no cervical adenopathy.  Neurological: She is alert and oriented to person, place, and time. No cranial nerve deficit.  Skin: Skin is warm and dry. She is not diaphoretic.  Psychiatric: She has a normal mood and affect. Her behavior is normal. Judgment and thought content normal.  Nursing note and vitals reviewed.  Assessment/Plan: 1. Acute non-recurrent pansinusitis - azithromycin (ZITHROMAX) 250 MG tablet; z-pack - take as directed for 5 days  Dispense: 6 tablet; Refill: 0 - predniSONE (STERAPRED UNI-PAK 21 TAB) 10 MG (21) TBPK tablet; 6 day taper - take by mouth as directed for 6 days  Dispense: 21 tablet; Refill: 0  2. Flu-like symptoms - POCT Influenza A/B -  negative for flu a or b  3. Cervicalgia Prednisone 6 day taper to help reduce pain/stiffness.    4. Controlled type 2 diabetes mellitus without complication, without long-term current use of insulin (HCC) Monitor blood sugars closely, especially while taking prednisone.   General Counseling: rosealyn little understanding of the findings of todays visit and agrees with plan of treatment. I have discussed any further diagnostic evaluation that may be needed or ordered today. We also reviewed her medications today. she has been encouraged to call the office with any questions or concerns that should arise related to todays visit.   This patient was seen by Leretha Pol, FNP- C in Collaboration with Dr Lavera Guise as a part of collaborative care agreement    Orders Placed This Encounter  Procedures  . POCT Influenza A/B    Meds ordered this encounter  Medications  . azithromycin (ZITHROMAX) 250 MG tablet    Sig: z-pack - take as directed  for 5 days    Dispense:  6 tablet    Refill:  0    Order Specific Question:   Supervising Provider    Answer:   Lavera Guise [[id number]]  . predniSONE (STERAPRED UNI-PAK 21 TAB) 10 MG (21) TBPK tablet    Sig: 6 day taper - take by mouth as directed for 6 days    Dispense:  21 tablet    Refill:  0    Order Specific Question:   Supervising Provider    Answer:   Lavera Guise [[id number]]    Time spent:15  Minutes

## 2017-04-16 DIAGNOSIS — J0140 Acute pansinusitis, unspecified: Secondary | ICD-10-CM | POA: Insufficient documentation

## 2017-04-16 DIAGNOSIS — R6889 Other general symptoms and signs: Secondary | ICD-10-CM | POA: Insufficient documentation

## 2017-04-16 DIAGNOSIS — M542 Cervicalgia: Secondary | ICD-10-CM | POA: Insufficient documentation

## 2017-04-18 ENCOUNTER — Ambulatory Visit
Admission: RE | Admit: 2017-04-18 | Discharge: 2017-04-18 | Disposition: A | Discharge status: Home / Self Care | Payer: Medicaid Other | Source: Ambulatory Visit | Attending: General Surgery | Admitting: General Surgery

## 2017-04-18 DIAGNOSIS — Z1231 Encounter for screening mammogram for malignant neoplasm of breast: Secondary | ICD-10-CM | POA: Diagnosis present

## 2017-04-29 ENCOUNTER — Other Ambulatory Visit: Payer: Self-pay

## 2017-04-29 DIAGNOSIS — E119 Type 2 diabetes mellitus without complications: Secondary | ICD-10-CM

## 2017-04-29 MED ORDER — EXENATIDE ER 2 MG ~~LOC~~ PEN: 2.0000 mg | PEN_INJECTOR | SUBCUTANEOUS | 5 refills | Status: DC

## 2017-04-30 ENCOUNTER — Ambulatory Visit: Payer: Self-pay | Admitting: General Surgery

## 2017-05-03 ENCOUNTER — Ambulatory Visit: Payer: Self-pay | Admitting: Nurse Practitioner

## 2017-05-08 ENCOUNTER — Other Ambulatory Visit: Payer: Self-pay

## 2017-05-08 DIAGNOSIS — F411 Generalized anxiety disorder: Principal | ICD-10-CM

## 2017-05-08 DIAGNOSIS — F410 Panic disorder [episodic paroxysmal anxiety] without agoraphobia: Secondary | ICD-10-CM

## 2017-05-08 MED ORDER — DIAZEPAM 5 MG PO TABS: ORAL_TABLET | ORAL | 0 refills | Status: DC

## 2017-05-14 ENCOUNTER — Encounter: Payer: Self-pay | Admitting: Emergency Medicine

## 2017-05-14 ENCOUNTER — Emergency Department: Payer: Medicaid Other

## 2017-05-14 ENCOUNTER — Other Ambulatory Visit: Payer: Self-pay

## 2017-05-14 ENCOUNTER — Emergency Department
Admission: EM | Admit: 2017-05-14 | Discharge: 2017-05-14 | Disposition: A | Discharge status: Home / Self Care | Payer: Medicaid Other | Attending: Emergency Medicine | Admitting: Emergency Medicine

## 2017-05-14 DIAGNOSIS — M25472 Effusion, left ankle: Secondary | ICD-10-CM | POA: Diagnosis not present

## 2017-05-14 DIAGNOSIS — Z7982 Long term (current) use of aspirin: Secondary | ICD-10-CM | POA: Diagnosis not present

## 2017-05-14 DIAGNOSIS — M7989 Other specified soft tissue disorders: Secondary | ICD-10-CM | POA: Diagnosis present

## 2017-05-14 DIAGNOSIS — Z79899 Other long term (current) drug therapy: Secondary | ICD-10-CM | POA: Diagnosis not present

## 2017-05-14 DIAGNOSIS — E1140 Type 2 diabetes mellitus with diabetic neuropathy, unspecified: Secondary | ICD-10-CM | POA: Insufficient documentation

## 2017-05-14 DIAGNOSIS — R609 Edema, unspecified: Secondary | ICD-10-CM

## 2017-05-14 DIAGNOSIS — Z853 Personal history of malignant neoplasm of breast: Secondary | ICD-10-CM | POA: Insufficient documentation

## 2017-05-14 DIAGNOSIS — F17210 Nicotine dependence, cigarettes, uncomplicated: Secondary | ICD-10-CM | POA: Insufficient documentation

## 2017-05-14 LAB — COMPREHENSIVE METABOLIC PANEL
ALT: 22 U/L (ref 14–54)
AST: 31 U/L (ref 15–41)
Albumin: 4 g/dL (ref 3.5–5.0)
Alkaline Phosphatase: 115 U/L (ref 38–126)
Anion gap: 9 (ref 5–15)
BUN: 13 mg/dL (ref 6–20)
CO2: 24 mmol/L (ref 22–32)
Calcium: 9 mg/dL (ref 8.9–10.3)
Chloride: 105 mmol/L (ref 101–111)
Creatinine, Ser: 1.13 mg/dL — ABNORMAL HIGH (ref 0.44–1.00)
GFR calc Af Amer: >60 mL/min (ref >60)
GFR calc non Af Amer: 53 mL/min — ABNORMAL LOW (ref >60)
Glucose, Bld: 97 mg/dL (ref 65–99)
Potassium: 3.5 mmol/L (ref 3.5–5.1)
Sodium: 138 mmol/L (ref 135–145)
Total Bilirubin: 0.8 mg/dL (ref 0.3–1.2)
Total Protein: 7.3 g/dL (ref 6.5–8.1)

## 2017-05-14 LAB — CBC WITH DIFFERENTIAL/PLATELET
Basophils Absolute: 0.1 10*3/uL (ref 0–0.1)
Basophils Relative: 1 %
Eosinophils Absolute: 0.4 10*3/uL (ref 0–0.7)
Eosinophils Relative: 5 %
HCT: 36.3 % (ref 35.0–47.0)
Hemoglobin: 12 g/dL (ref 12.0–16.0)
Lymphocytes Relative: 29 %
Lymphs Abs: 2.2 10*3/uL (ref 1.0–3.6)
MCH: 28 pg (ref 26.0–34.0)
MCHC: 33 g/dL (ref 32.0–36.0)
MCV: 84.8 fL (ref 80.0–100.0)
Monocytes Absolute: 0.4 10*3/uL (ref 0.2–0.9)
Monocytes Relative: 6 %
Neutro Abs: 4.5 10*3/uL (ref 1.4–6.5)
Neutrophils Relative %: 59 %
Platelets: 231 10*3/uL (ref 150–440)
RBC: 4.28 MIL/uL (ref 3.80–5.20)
RDW: 15 % — ABNORMAL HIGH (ref 11.5–14.5)
WBC: 7.7 10*3/uL (ref 3.6–11.0)

## 2017-05-14 MED ORDER — LIDOCAINE-PRILOCAINE 2.5-2.5 % EX CREA
TOPICAL_CREAM | Freq: Once | CUTANEOUS | Status: AC
  Administered 2017-05-14: 21:00:00 via TOPICAL

## 2017-05-14 MED ORDER — LIDOCAINE-PRILOCAINE 2.5-2.5 % EX CREA
TOPICAL_CREAM | CUTANEOUS | Status: AC
  Filled 2017-05-14: qty 5

## 2017-05-14 MED ORDER — OXYCODONE HCL 5 MG PO TABS
5.0000 mg | ORAL_TABLET | Freq: Once | ORAL | Status: AC
  Administered 2017-05-14: 5 mg via ORAL
  Filled 2017-05-14: qty 1

## 2017-05-14 MED ORDER — ACETAMINOPHEN 500 MG PO TABS
1000.0000 mg | ORAL_TABLET | Freq: Once | ORAL | Status: AC
  Administered 2017-05-14: 1000 mg via ORAL
  Filled 2017-05-14: qty 2

## 2017-05-14 MED ORDER — PREDNISONE 20 MG PO TABS: 40.0000 mg | ORAL_TABLET | Freq: Every day | ORAL | 0 refills | Status: AC

## 2017-05-14 MED ORDER — OXYCODONE-ACETAMINOPHEN 5-325 MG PO TABS: 1.0000 | ORAL_TABLET | ORAL | 0 refills | Status: DC | PRN

## 2017-05-14 NOTE — ED Notes (Signed)
ED Provider at bedside. 

## 2017-05-14 NOTE — ED Provider Notes (Signed)
[facility] Emergency Department Provider Note  ____________________________________________  Time seen: Approximately 8:07 PM  I have reviewed the triage vital signs and the nursing notes.   HISTORY  Chief Complaint Leg Swelling   HPI Holly Espinoza is a 57 y.o. female the history of arthritis, anemia, chronic back pain, peripheral neuropathy, diabetes, rheumatoid arthritis, COPD who presents for evaluation of left leg pain and swelling. Patient reports several days of progressively worsening swelling and pain in the left lower extremity. She reports that the pain is 8 out of 10, sharp, constant, located in her calf and ankle on the left and worse with weightbearing. She denies any trauma, falls, twisting her ankle. She denies any personal or family history of blood clots. She denies any oxygen is hormones. She has a history of remote breast cancer. She denies any chest pain. Patient does have shortness of breath which she reports is chronic for her due to her COPD/bronchitis. She denies any fever or chills.  Past Medical History:  Diagnosis Date  . Anxiety   . Arthritis    "pretty much from my neck to my feet" (07/08/2012)  . B12 deficiency anemia   . Chronic lower back pain   . Depression   . Diabetic peripheral neuropathy ([place])   . Gout   . High cholesterol   . 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 Neuromuscular disorder ([place])   . Rheumatoid arthritis ([place])   . Type II diabetes mellitus (HCC)    recent hgb A1C was 5.4 per Dr. Humphrey Rolls, [place], [place]    Patient Active Problem List   Diagnosis Date Noted  . Acute non-recurrent pansinusitis 04/16/2017  . Flu-like symptoms 04/16/2017  . Cervicalgia 04/16/2017  . Rectocele 11/01/2015  . Arthritis 08/02/2015  . Controlled type 2 diabetes mellitus without complication ([place]) 78/29/5621  . Neuralgia neuritis, sciatic nerve 08/02/2015  . B12 deficiency 08/02/2015  . Lobular  carcinoma in situ of breast 02/16/2013  . Post menopausal syndrome 09/24/2012  . Absolute anemia 09/18/2012  . Edema leg 09/03/2012  . Protein-calorie malnutrition, severe ([place]) 08/15/2012  . Back ache 01/14/2012  . Diabetes mellitus ([place]) 01/14/2012  . HLD (hyperlipidemia) 01/14/2012  . BP (high blood pressure) 01/14/2012  . Flu vaccine need 01/14/2012  . Diabetic peripheral neuropathy associated with type 2 diabetes mellitus ([place]) 10/12/2011  . Abnormal kidney function study 06/28/2011  . Benign neoplasm of soft tissues 05/31/2011    Past Surgical History:  Procedure Laterality Date  . ABDOMINAL HYSTERECTOMY  2001?  . ANTERIOR AND POSTERIOR REPAIR N/A 11/01/2015   Procedure: CYSTOSCOPY, ANTERIOR (CYSTOCELE) AND POSTERIOR REPAIR (RECTOCELE);  Surgeon: Bjorn Loser, MD;  Location: [facility] ORS;  Service: Urology;  Laterality: N/A;  . ANTERIOR CERVICAL DECOMP/DISCECTOMY FUSION  2012  . BACK SURGERY  2001   laminectomy  . BREAST BIOPSY Left 2014  . BREAST LUMPECTOMY Left 02/25/2013  . BREAST SURGERY Left 02/25/13   lumpectomy  . CHOLECYSTECTOMY  1980's  . DILATION AND CURETTAGE OF UTERUS  [phone]   "probably 3" (07/08/2012)  . FRACTURE SURGERY     C4-7 Surgery for fracture  . LUMBAR WOUND DEBRIDEMENT N/A 08/12/2012   Procedure: LUMBAR WOUND DEBRIDEMENT;  Surgeon: Faythe Ghee, MD;  Location: [facility];  Service: Neurosurgery;  Laterality: N/A;  Lumbar Wound Debridement  . POSTERIOR LUMBAR FUSION  07/08/2012  . TONSILLECTOMY  ~ 1968    Prior to Admission medications   Medication Sig Start Date  End Date Taking? Authorizing Provider  allopurinol (ZYLOPRIM) 100 MG tablet Take 2 tablets (200 mg total) by mouth at bedtime. 04/04/17   Ronnell Freshwater, NP  aspirin 81 MG chewable tablet Chew 81 mg by mouth daily.    [provider]  azithromycin (ZITHROMAX) 250 MG tablet z-pack - take as directed for 5 days 04/15/17   Ronnell Freshwater, NP  diazepam (VALIUM) 5 MG tablet  Take 1 tablet po bid prn for muscle pain and tightness. 05/08/17   Ronnell Freshwater, NP  DULoxetine (CYMBALTA) 60 MG capsule Take 1 capsule (60 mg total) by mouth daily. 04/04/17   Ronnell Freshwater, NP  Exenatide ER (BYDUREON) 2 MG PEN Inject 2 mg into the skin once a week. 04/29/17   Ronnell Freshwater, NP  furosemide (LASIX) 20 MG tablet Take 1 tablet (20 mg total) by mouth daily as needed for fluid or edema. 04/04/17   Ronnell Freshwater, NP  gemfibrozil (LOPID) 600 MG tablet Take 1 tablet (600 mg total) by mouth 2 (two) times daily before a meal. 04/04/17   Ronnell Freshwater, NP  levothyroxine (SYNTHROID, LEVOTHROID) 75 MCG tablet Take 1 tablet (75 mcg total) by mouth daily before breakfast. 04/04/17   Ronnell Freshwater, NP  mirtazapine (REMERON) 15 MG tablet Take 1 tablet (15 mg total) by mouth at bedtime. 04/04/17   Ronnell Freshwater, NP  omeprazole (PRILOSEC) 40 MG capsule Take 1 capsule (40 mg total) by mouth daily. 04/04/17   Ronnell Freshwater, NP  ondansetron (ZOFRAN) 4 MG tablet Take 4 mg by mouth every 8 (eight) hours as needed for nausea. For nausea    [provider]  oxyCODONE (OXYCONTIN) 10 mg 12 hr tablet Take 10 mg by mouth every 12 (twelve) hours.    [provider]  oxyCODONE-acetaminophen (PERCOCET) 5-325 MG tablet Take 1 tablet by mouth every 4 (four) hours as needed for severe pain. 05/14/17   Rudene Re, MD  predniSONE (DELTASONE) 20 MG tablet Take 2 tablets (40 mg total) by mouth daily for 5 days. 05/14/17 05/19/17  Rudene Re, MD  pregabalin (LYRICA) 50 MG capsule Take 50 mg by mouth 2 (two) times daily.    [provider]  topiramate (TOPAMAX) 100 MG tablet Take 100 mg by mouth 2 (two) times daily.    [provider]    Allergies Patient has no known allergies.  Family History  Problem Relation Age of Onset  . Kidney disease Father   . Cancer Other        breast great grandmother  . Breast cancer Other     Social  History Social History   Tobacco Use  . Smoking status: Current Every Day Smoker    Packs/day: 0.50    Years: 40.00    Pack years: 20.00    Types: Cigarettes  . Smokeless tobacco: Never Used  Substance Use Topics  . Alcohol use: No  . Drug use: No    Comment: 07/08/2012 "last drug use >8 yr ago" (07/08/2012)    Review of Systems  Constitutional: Negative for fever. Eyes: Negative for visual changes. ENT: Negative for sore throat. Neck: No neck pain  Cardiovascular: Negative for chest pain. Respiratory: Negative for shortness of breath. Gastrointestinal: Negative for abdominal pain, vomiting or diarrhea. Genitourinary: Negative for dysuria. Musculoskeletal: Negative for back pain. + LLE swelling and pain Skin: Negative for rash. Neurological: Negative for headaches, weakness or numbness. Psych: No SI or HI  ____________________________________________  PHYSICAL EXAM:  VITAL SIGNS: ED Triage Vitals  Enc Vitals Group     BP 05/14/17 1857 121/75     Pulse Rate 05/14/17 1857 83     Resp 05/14/17 1857 18     Temp 05/14/17 1857 98.1 F (36.7 C)     Temp Source 05/14/17 1857 Oral     SpO2 05/14/17 1857 95 %     Weight 05/14/17 1900 209 lb (94.8 kg)     Height 05/14/17 1900 5\' 9"  (1.753 m)     Head Circumference --      Peak Flow --      Pain Score 05/14/17 1859 8     Pain Loc --      Pain Edu? --      Excl. in [place]? --     Constitutional: Alert and oriented. Well appearing and in no apparent distress. HEENT:      Head: Normocephalic and atraumatic.         Eyes: Conjunctivae are normal. Sclera is non-icteric.       Mouth/Throat: Mucous membranes are moist.       Neck: Supple with no signs of meningismus. Cardiovascular: Regular rate and rhythm. No murmurs, gallops, or rubs. 2+ symmetrical distal pulses are present in all extremities. No JVD. Respiratory: Normal respiratory effort. Lungs are clear to auscultation bilaterally. No wheezes, crackles, or rhonchi.   Gastrointestinal: Soft, non tender, and non distended with positive bowel sounds. No rebound or guarding. Musculoskeletal: There is significant swelling with 1+ pitting edema starting on the foot going all the way to the knee on the left lower extremity, stronger distal pulses and brisk capillary refill, no erythema or warmth, patient is tender to palpation on the medial malleolus on the left no obvious deformity.  Neurologic: Normal speech and language. Face is symmetric. Moving all extremities. No gross focal neurologic deficits are appreciated. Skin: Skin is warm, dry and intact. No rash noted. Psychiatric: Mood and affect are normal. Speech and behavior are normal.  ____________________________________________   LABS (all labs ordered are listed, but only abnormal results are displayed)  Labs Reviewed  CBC WITH DIFFERENTIAL/PLATELET - Abnormal; Notable for the following components:      Result Value   RDW 15.0 (*)    All other components within normal limits  COMPREHENSIVE METABOLIC PANEL - Abnormal; Notable for the following components:   Creatinine, Ser 1.13 (*)    GFR calc non Af Amer 53 (*)    All other components within normal limits   ____________________________________________  EKG  none ____________________________________________  RADIOLOGY  I have personally reviewed the images performed during this visit and I agree with the Radiologist's read.   Interpretation by Radiologist:  Dg Ankle Complete Left  Result Date: 05/14/2017 CLINICAL DATA:  Swelling for several days.  No trauma. EXAM: LEFT ANKLE COMPLETE - 3+ VIEW COMPARISON:  For with foot radiographs of 06/06/2009. FINDINGS: Primarily medial soft tissue swelling. Mild degenerative changes about the ankle. Small calcaneal and Achilles spurs. No acute fracture or dislocation. No radiopaque foreign object. No soft tissue gas. IMPRESSION: Soft tissue swelling and mild degenerative changes. No acute findings.  Electronically Signed   By: Abigail Miyamoto M.D.   On: 05/14/2017 20:45   US Venous Img Lower Unilateral Left  Result Date: 05/14/2017 CLINICAL DATA:  Left lower extremity swelling for 5 days. EXAM: LEFT LOWER EXTREMITY VENOUS DOPPLER ULTRASOUND TECHNIQUE: Gray-scale sonography with graded compression, as well as color Doppler and duplex ultrasound were performed  to evaluate the lower extremity deep venous systems from the level of the common femoral vein and including the common femoral, femoral, profunda femoral, popliteal and calf veins including the posterior tibial, peroneal and gastrocnemius veins when visible. The superficial great saphenous vein was also interrogated. Spectral Doppler was utilized to evaluate flow at rest and with distal augmentation maneuvers in the common femoral, femoral and popliteal veins. COMPARISON:  None. FINDINGS: Contralateral Common Femoral Vein: Respiratory phasicity is normal and symmetric with the symptomatic side. No evidence of thrombus. Normal compressibility. Common Femoral Vein: No evidence of thrombus. Normal compressibility, respiratory phasicity and response to augmentation. Saphenofemoral Junction: No evidence of thrombus. Normal compressibility and flow on color Doppler imaging. Profunda Femoral Vein: No evidence of thrombus. Normal compressibility and flow on color Doppler imaging. Femoral Vein: No evidence of thrombus. Normal compressibility, respiratory phasicity and response to augmentation. Popliteal Vein: No evidence of thrombus. Normal compressibility, respiratory phasicity and response to augmentation. Calf Veins: No evidence of thrombus. Normal compressibility and flow on color Doppler imaging. Superficial Great Saphenous Vein: No evidence of thrombus. Normal compressibility. Venous Reflux:  None. Other Findings:  None. IMPRESSION: No evidence of deep venous thrombosis in the left lower extremity. Electronically Signed   By: Ilona Sorrel M.D.   On: 05/14/2017  20:08      ____________________________________________   PROCEDURES  Procedure(s) performed: yes .Joint Aspiration/Arthrocentesis Date/Time: 05/14/2017 10:09 PM Performed by: Rudene Re, MD Authorized by: Rudene Re, MD   Consent:    Consent obtained:  Written   Consent given by:  Patient   Risks discussed:  Bleeding, infection, pain, nerve damage and incomplete drainage   Alternatives discussed:  No treatment Location:    Location:  Ankle   Ankle:  L ankle Anesthesia (see MAR for exact dosages):    Anesthesia method:  Topical application   Topical anesthetic:  EMLA cream Procedure details:    Preparation: Patient was prepped and draped in usual sterile fashion     Needle gauge:  22 G   Approach:  Medial   Aspirate amount:  0   Steroid injected: no     Specimen collected: no   Post-procedure details:    Dressing:  Adhesive bandage   Patient tolerance of procedure:  Tolerated well, no immediate complications   Critical Care performed:  None ____________________________________________   INITIAL IMPRESSION / ASSESSMENT AND PLAN / ED COURSE   57 y.o. female the history of arthritis, anemia, chronic back pain, peripheral neuropathy, diabetes, rheumatoid arthritis, COPD who presents for evaluation of left leg pain and swelling. Doppler studies pending to rule out DVT. No clinical evidence of cellulitis. If [place] negative will pursue XR to rule out fracture. Limited ROM of the ankle due to pain but patient still has decent ROM with no clinical evidence of septic joint.     _________________________ 10:07 PM on 05/14/2017 -----------------------------------------  Doppler negative for DVT. X-ray showing soft tissue swelling and mild degenerative changes. I did a arthrocentesis of the joint and unable to collect any fluid. Very low suspicion that patient has inflammatory or infectious arthritis due to good range of motion, no erythema or warmth. Labs are within  normal limits with normal white count. At this time patient was provided with crutches, pain control, and steroids. Recommended elevating the leg and apply ice. Discussed return precautions and close follow-up with primary care doctor.   As part of my medical decision making, I reviewed the following data within the [facility]  Nursing notes reviewed and incorporated, Labs reviewed , Radiograph reviewed , Notes from prior ED visits and [place] Controlled Substance Database    Pertinent labs & imaging results that were available during my care of the patient were reviewed by me and considered in my medical decision making (see chart for details).    ____________________________________________   FINAL CLINICAL IMPRESSION(S) / ED DIAGNOSES  Final diagnoses:  Left leg swelling  Left ankle swelling      NEW MEDICATIONS STARTED DURING THIS VISIT:  ED Discharge Orders        Ordered    predniSONE (DELTASONE) 20 MG tablet  Daily     05/14/17 2203    oxyCODONE-acetaminophen (PERCOCET) 5-325 MG tablet  Every 4 hours PRN     05/14/17 2203       Note:  This document was prepared using Dragon voice recognition software and may include unintentional dictation errors.    Rudene Re, MD 05/14/17 2209

## 2017-05-14 NOTE — ED Notes (Addendum)
Pt reports swelling to lower left leg/ankle/foot. Pt denies inj. Pt reports pain as well as numbness and tingling, pt also reports "losing feeling up into my calf." Pt states she ambulates on the foot however has to turn the foot when walking. No redness or warmth noted, swelling is noted. Pt denies hx of blood clots.

## 2017-05-14 NOTE — Discharge Instructions (Signed)
Elevate leg, apply ice several times a day, take steroids as prescribed. If pain and swelling do not improve after 7 days return to the ER for further evaluation. Follow up with your doctor in 3 days. Return to the ER if you have worsening pain, swelling, fever, or redness on the leg.

## 2017-05-14 NOTE — ED Notes (Signed)
Pt to u/s via w/c accomp by u/s tech 

## 2017-05-14 NOTE — ED Notes (Signed)
Pt declined crutches.

## 2017-05-14 NOTE — ED Triage Notes (Signed)
Pt presents via pov from home with swelling to left foot and leg. She states this has been going on for several days. She has appointment with pcp on Friday but felt that it couldn't wait until then. Pt alert & oriented with NAD noted.

## 2017-05-14 NOTE — ED Notes (Signed)

## 2017-05-17 ENCOUNTER — Ambulatory Visit: Payer: Medicaid Other | Admitting: Nurse Practitioner

## 2017-05-17 ENCOUNTER — Encounter: Payer: Self-pay | Admitting: Nurse Practitioner

## 2017-05-17 VITALS — BP 147/94 | HR 85 | Resp 16 | Ht 70.0 in | Wt 226.0 lb

## 2017-05-17 DIAGNOSIS — M25572 Pain in left ankle and joints of left foot: Secondary | ICD-10-CM | POA: Diagnosis not present

## 2017-05-17 DIAGNOSIS — M542 Cervicalgia: Secondary | ICD-10-CM

## 2017-05-17 DIAGNOSIS — K219 Gastro-esophageal reflux disease without esophagitis: Secondary | ICD-10-CM | POA: Diagnosis not present

## 2017-05-17 DIAGNOSIS — F331 Major depressive disorder, recurrent, moderate: Secondary | ICD-10-CM

## 2017-05-17 DIAGNOSIS — E119 Type 2 diabetes mellitus without complications: Secondary | ICD-10-CM

## 2017-05-17 DIAGNOSIS — E785 Hyperlipidemia, unspecified: Secondary | ICD-10-CM | POA: Diagnosis not present

## 2017-05-17 LAB — POCT GLYCOSYLATED HEMOGLOBIN (HGB A1C): Hemoglobin A1C: 6

## 2017-05-17 MED ORDER — OMEPRAZOLE 40 MG PO CPDR
40.0000 mg | DELAYED_RELEASE_CAPSULE | Freq: Every day | ORAL | 3 refills | Status: DC
Start: 2017-05-17 — End: 2018-05-07

## 2017-05-17 NOTE — Progress Notes (Signed)
[facility] [place], [place] [address]  Internal MEDICINE  Office Visit Note  Patient Name: Holly Espinoza  [id number]  [id number]  Date of Service: 06/08/2017  Chief Complaint  Patient presents with  . Ankle Pain    bilateral ankles with pitting edema.  . Diabetes    stable blood sugars.    The patient is here for routine follow up exam. She was seen on Tuesday, in the ER, for left ankle pain and swelling. X-ray did show some soft tissue swelling and calchaneal and Achilles spurring, without other abnormalities. Ultrasound was negative for DVT of blood clot. Swelling has improved, however, it is 2+ and pitting. Swelling is starting to show on right side now. Not to the severity of the left.     Pt is here for routine follow up.    Current Medication: Outpatient Encounter Medications as of 05/17/2017  Medication Sig  . allopurinol (ZYLOPRIM) 100 MG tablet Take 2 tablets (200 mg total) by mouth at bedtime.  Marland Kitchen aspirin 81 MG chewable tablet Chew 81 mg by mouth daily.  . diazepam (VALIUM) 5 MG tablet Take 1 tablet po bid prn for muscle pain and tightness.  . DULoxetine (CYMBALTA) 60 MG capsule Take 1 capsule (60 mg total) by mouth daily.  . Exenatide ER (BYDUREON) 2 MG PEN Inject 2 mg into the skin once a week.  . furosemide (LASIX) 20 MG tablet Take 1 tablet (20 mg total) by mouth daily as needed for fluid or edema.  Marland Kitchen gemfibrozil (LOPID) 600 MG tablet Take 1 tablet (600 mg total) by mouth 2 (two) times daily before a meal.  . levothyroxine (SYNTHROID, LEVOTHROID) 75 MCG tablet Take 1 tablet (75 mcg total) by mouth daily before breakfast.  . mirtazapine (REMERON) 15 MG tablet Take 1 tablet (15 mg total) by mouth at bedtime.  Marland Kitchen omeprazole (PRILOSEC) 40 MG capsule Take 1 capsule (40 mg total) by mouth daily.  . ondansetron (ZOFRAN) 4 MG tablet Take 4 mg by mouth every 8 (eight) hours as needed for nausea. For nausea  . oxyCODONE (OXYCONTIN) 10 mg 12 hr tablet  Take 10 mg by mouth every 12 (twelve) hours.  Marland Kitchen oxyCODONE-acetaminophen (PERCOCET) 5-325 MG tablet Take 1 tablet by mouth every 4 (four) hours as needed for severe pain.  . [EXPIRED] predniSONE (DELTASONE) 20 MG tablet Take 2 tablets (40 mg total) by mouth daily for 5 days.  . pregabalin (LYRICA) 50 MG capsule Take 50 mg by mouth 2 (two) times daily.  Marland Kitchen topiramate (TOPAMAX) 100 MG tablet Take 100 mg by mouth 2 (two) times daily.  . [DISCONTINUED] azithromycin (ZITHROMAX) 250 MG tablet z-pack - take as directed for 5 days  . [DISCONTINUED] omeprazole (PRILOSEC) 40 MG capsule Take 1 capsule (40 mg total) by mouth daily.   No facility-administered encounter medications on file as of 05/17/2017.     Surgical History: Past Surgical History:  Procedure Laterality Date  . ABDOMINAL HYSTERECTOMY  2001?  . ANTERIOR AND POSTERIOR REPAIR N/A 11/01/2015   Procedure: CYSTOSCOPY, ANTERIOR (CYSTOCELE) AND POSTERIOR REPAIR (RECTOCELE);  Surgeon: Bjorn Loser, MD;  Location: [facility];  Service: Urology;  Laterality: N/A;  . ANTERIOR CERVICAL DECOMP/DISCECTOMY FUSION  2012  . BACK SURGERY  2001   laminectomy  . BREAST BIOPSY Left 2014  . BREAST LUMPECTOMY Left 02/25/2013  . BREAST SURGERY Left 02/25/13   lumpectomy  . CHOLECYSTECTOMY  1980's  . DILATION AND CURETTAGE OF UTERUS  [phone]   "  probably 3" (07/08/2012)  . FRACTURE SURGERY     C4-7 Surgery for fracture  . LUMBAR WOUND DEBRIDEMENT N/A 08/12/2012   Procedure: LUMBAR WOUND DEBRIDEMENT;  Surgeon: Faythe Ghee, MD;  Location: [facility];  Service: Neurosurgery;  Laterality: N/A;  Lumbar Wound Debridement  . POSTERIOR LUMBAR FUSION  07/08/2012  . TONSILLECTOMY  ~ 1968    Medical History: Past Medical History:  Diagnosis Date  . Anxiety   . Arthritis    "pretty much from my neck to my feet" (07/08/2012)  . B12 deficiency anemia   . Chronic lower back pain   . Depression   . Diabetic peripheral neuropathy ([place])   . Gout   . High  cholesterol   . 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 Neuromuscular disorder ([place])   . Rheumatoid arthritis ([place])   . Type II diabetes mellitus (HCC)    recent hgb A1C was 5.4 per Dr. Humphrey Rolls, [place], [place]    Family History: Family History  Problem Relation Age of Onset  . Kidney disease Father   . Cancer Other        breast great grandmother  . Breast cancer Other     Social History   Socioeconomic History  . Marital status: Single    Spouse name: Not on file  . Number of children: Not on file  . Years of education: Not on file  . Highest education level: Not on file  Occupational History  . Not on file  Social Needs  . Financial resource strain: Not on file  . Food insecurity:    Worry: Not on file    Inability: Not on file  . Transportation needs:    Medical: Not on file    Non-medical: Not on file  Tobacco Use  . Smoking status: Current Every Day Smoker    Packs/day: 0.50    Years: 40.00    Pack years: 20.00    Types: Cigarettes  . Smokeless tobacco: Never Used  Substance and Sexual Activity  . Alcohol use: No  . Drug use: No    Types: Marijuana, Cocaine    Comment: 07/08/2012 "last drug use >8 yr ago" (07/08/2012)  . Sexual activity: Yes    Comment: quit a long time ago  Lifestyle  . Physical activity:    Days per week: Not on file    Minutes per session: Not on file  . Stress: Not on file  Relationships  . Social connections:    Talks on phone: Not on file    Gets together: Not on file    Attends religious service: Not on file    Active member of club or organization: Not on file    Attends meetings of clubs or organizations: Not on file    Relationship status: Not on file  . Intimate partner violence:    Fear of current or ex partner: Not on file    Emotionally abused: Not on file    Physically abused: Not on file    Forced sexual activity: Not on file  Other Topics Concern  . Not on file  Social History Narrative  .  Not on file      Review of Systems  Constitutional: Positive for activity change and fatigue.  HENT: Negative for congestion, postnasal drip, rhinorrhea and sore throat.   Eyes: Negative.   Respiratory: Positive for shortness of breath. Negative for wheezing.   Cardiovascular: Positive for leg swelling. Negative for  chest pain and palpitations.  Gastrointestinal: Negative for diarrhea, nausea and vomiting.  Endocrine:       Blood sugars doing well   Genitourinary: Negative.   Musculoskeletal: Positive for arthralgias, back pain, gait problem and neck pain.       Left ankle pain and swelling. Has noted discoloration to the left ankle.   Skin: Negative for rash.  Hematological: Negative for adenopathy.  Psychiatric/Behavioral: The patient is nervous/anxious.     Today's Vitals   05/17/17 1451  BP: (!) 147/94  Pulse: 85  Resp: 16  SpO2: 98%  Weight: 226 lb (102.5 kg)  Height: 5\' 10"  (1.778 m)    Physical Exam  Constitutional: She is oriented to person, place, and time. She appears well-developed and well-nourished. No distress.  HENT:  Head: Normocephalic and atraumatic.  Right Ear: There is tenderness. Tympanic membrane is erythematous and bulging.  Left Ear: There is tenderness. Tympanic membrane is erythematous and bulging.  Nose: Rhinorrhea present. Right sinus exhibits frontal sinus tenderness. Left sinus exhibits frontal sinus tenderness.  Mouth/Throat: Posterior oropharyngeal edema present. No oropharyngeal exudate.  Eyes: EOM are normal. Pupils are equal, round, and reactive to light.  Neck: Normal range of motion. Neck supple. No JVD present. No tracheal deviation present. No thyromegaly present.  Cardiovascular: Normal rate, regular rhythm and normal heart sounds. Exam reveals no gallop and no friction rub.  No murmur heard. Pulmonary/Chest: Effort normal. No respiratory distress. She has no wheezes. She has no rales. She exhibits no tenderness.  Abdominal: Soft.  Bowel sounds are normal.  Musculoskeletal:  Left ankle slightly discolored there is 1+ edema in the left ankle. Very tender, even with light palpation. Patient unable to walk or put weight on the left foot.  Lymphadenopathy:    She has no cervical adenopathy.  Neurological: She is alert and oriented to person, place, and time. No cranial nerve deficit.  Skin: Skin is warm and dry. She is not diaphoretic.  Psychiatric: She has a normal mood and affect. Her behavior is normal. Judgment and thought content normal.  Nursing note and vitals reviewed.  Assessment/Plan: 1. Diabetes mellitus without complication ([place]) - POCT HgB A1C 6.0 today. Continue diabetic medications s prescribed.   2. Acute left ankle pain Previously performed x-rays negaive for fracture or abnormalities.  - Ambulatory referral to Podiatry  3. Gastroesophageal reflux disease without esophagitis - omeprazole (PRILOSEC) 40 MG capsule; Take 1 capsule (40 mg total) by mouth daily.  Dispense: 30 capsule; Refill: 3  4. Cervicalgia May use diazepam as needed and as prescribed.   5. Hyperlipidemia, unspecified hyperlipidemia type continue gemfibrozil as prescribed.   6. Moderate episode of recurrent major depressive disorder (HCC) Continue duloxetine as prescribed .  General Counseling: hania cerone understanding of the findings of todays visit and agrees with plan of treatment. I have discussed any further diagnostic evaluation that may be needed or ordered today. We also reviewed her medications today. she has been encouraged to call the office with any questions or concerns that should arise related to todays visit.   Diabetes Counseling:  1. Addition of ACE inh/ ARB'S for nephroprotection. 2. Diabetic foot care, prevention of complications.  3.Exercise and lose weight.  4. Diabetic eye examination, 5. Monitor blood sugar closlely. nutrition counseling.  6.Sign and symptoms of hypoglycemia including shaking  sweating,confusion and headaches.  This patient was seen by Leretha Pol, FNP- C in Collaboration with Dr Lavera Guise as a part of collaborative care agreement  Orders Placed This Encounter  Procedures  . Ambulatory referral to Podiatry  . POCT HgB A1C    Meds ordered this encounter  Medications  . omeprazole (PRILOSEC) 40 MG capsule    Sig: Take 1 capsule (40 mg total) by mouth daily.    Dispense:  30 capsule    Refill:  3    Order Specific Question:   Supervising Provider    Answer:   Lavera Guise [[id number]]    Time spent: 28  Minutes   Dr Lavera Guise Internal medicine

## 2017-06-08 DIAGNOSIS — F331 Major depressive disorder, recurrent, moderate: Secondary | ICD-10-CM | POA: Insufficient documentation

## 2017-06-08 DIAGNOSIS — K219 Gastro-esophageal reflux disease without esophagitis: Secondary | ICD-10-CM | POA: Insufficient documentation

## 2017-06-08 DIAGNOSIS — M25572 Pain in left ankle and joints of left foot: Secondary | ICD-10-CM | POA: Insufficient documentation

## 2017-06-18 ENCOUNTER — Other Ambulatory Visit: Payer: Self-pay

## 2017-06-19 ENCOUNTER — Telehealth: Payer: Self-pay

## 2017-06-19 ENCOUNTER — Other Ambulatory Visit: Payer: Self-pay | Admitting: Nurse Practitioner

## 2017-06-19 MED ORDER — B-12 1000 MCG/ML IJ KIT: 1000.0000 ug | PACK | INTRAMUSCULAR | 3 refills | Status: DC

## 2017-06-19 NOTE — Telephone Encounter (Signed)
Pt advised thatwe send b12 med to phar

## 2017-06-19 NOTE — Telephone Encounter (Signed)
I did referral for her back in March. Did she see them already? She should definitely be calling them if symptoms have worsened if she's seen them.

## 2017-06-26 ENCOUNTER — Other Ambulatory Visit: Payer: Self-pay | Admitting: Nurse Practitioner

## 2017-06-26 DIAGNOSIS — E785 Hyperlipidemia, unspecified: Secondary | ICD-10-CM

## 2017-06-26 MED ORDER — GEMFIBROZIL 600 MG PO TABS: 600.0000 mg | ORAL_TABLET | Freq: Two times a day (BID) | ORAL | 3 refills | Status: DC

## 2017-07-08 ENCOUNTER — Encounter: Payer: Self-pay | Admitting: Nurse Practitioner

## 2017-07-08 ENCOUNTER — Ambulatory Visit: Payer: Medicaid Other | Admitting: Nurse Practitioner

## 2017-07-08 VITALS — BP 148/78 | HR 82 | Resp 16 | Ht 70.0 in | Wt 218.2 lb

## 2017-07-08 DIAGNOSIS — F410 Panic disorder [episodic paroxysmal anxiety] without agoraphobia: Secondary | ICD-10-CM

## 2017-07-08 DIAGNOSIS — F411 Generalized anxiety disorder: Secondary | ICD-10-CM | POA: Diagnosis not present

## 2017-07-08 DIAGNOSIS — N8110 Cystocele, unspecified: Secondary | ICD-10-CM

## 2017-07-08 DIAGNOSIS — B373 Candidiasis of vulva and vagina: Secondary | ICD-10-CM

## 2017-07-08 DIAGNOSIS — B3731 Acute candidiasis of vulva and vagina: Secondary | ICD-10-CM

## 2017-07-08 DIAGNOSIS — E119 Type 2 diabetes mellitus without complications: Secondary | ICD-10-CM

## 2017-07-08 DIAGNOSIS — N390 Urinary tract infection, site not specified: Secondary | ICD-10-CM | POA: Diagnosis not present

## 2017-07-08 DIAGNOSIS — R300 Dysuria: Secondary | ICD-10-CM | POA: Diagnosis not present

## 2017-07-08 LAB — POCT URINALYSIS DIPSTICK
Bilirubin, UA: NEGATIVE
Blood, UA: NEGATIVE
Glucose, UA: NEGATIVE
Ketones, UA: NEGATIVE
Nitrite, UA: NEGATIVE
Protein, UA: NEGATIVE
Spec Grav, UA: 1.01 (ref 1.010–1.025)
Urobilinogen, UA: NEGATIVE E.U./dL — AB
pH, UA: 6 (ref 5.0–8.0)

## 2017-07-08 MED ORDER — FLUCONAZOLE 150 MG PO TABS: ORAL_TABLET | ORAL | 0 refills | Status: DC

## 2017-07-08 MED ORDER — DIAZEPAM 5 MG PO TABS: ORAL_TABLET | ORAL | 2 refills | Status: DC

## 2017-07-08 MED ORDER — CIPROFLOXACIN HCL 500 MG PO TABS: 500.0000 mg | ORAL_TABLET | Freq: Two times a day (BID) | ORAL | 0 refills | Status: DC

## 2017-07-08 NOTE — Progress Notes (Signed)
[facility] [place], [place] [address]  Internal MEDICINE  Office Visit Note  Patient Name: Holly Espinoza  [id number]  [id number]  Date of Service: 07/09/2017   Pt is here for a sick visit  Chief Complaint  Patient presents with  . bladder issues    feel like the bladder is fallen      The patient as noted the feel of her bladder protruding thorugh vaginal opening after each time she urinates. Can feel it when she wipes with toilet tissue. Has noted some pain with urination as well as itching around the vaginal opening. Has been present for about a week. In 2017, she had this same thing, She did have surgery to repair prolapsed bladder. This feels the same as when bladder previously prolapsed.    .      Current Medication:  Outpatient Encounter Medications as of 07/08/2017  Medication Sig  . allopurinol (ZYLOPRIM) 100 MG tablet Take 2 tablets (200 mg total) by mouth at bedtime.  Marland Kitchen aspirin 81 MG chewable tablet Chew 81 mg by mouth daily.  . ciprofloxacin (CIPRO) 500 MG tablet Take 1 tablet (500 mg total) by mouth 2 (two) times daily.  . Cyanocobalamin (B-12) 1000 MCG/ML KIT Inject 1,000 mcg as directed every 30 (thirty) days.  . diazepam (VALIUM) 5 MG tablet Take 1 tablet po bid prn for muscle pain and tightness.  . DULoxetine (CYMBALTA) 60 MG capsule Take 1 capsule (60 mg total) by mouth daily.  . Exenatide ER (BYDUREON) 2 MG PEN Inject 2 mg into the skin once a week.  . fluconazole (DIFLUCAN) 150 MG tablet Take 1 tablet po once. May repeat dose in 3 days as needed for persistent symptoms.  . furosemide (LASIX) 20 MG tablet Take 1 tablet (20 mg total) by mouth daily as needed for fluid or edema.  Marland Kitchen gemfibrozil (LOPID) 600 MG tablet Take 1 tablet (600 mg total) by mouth 2 (two) times daily before a meal.  . levothyroxine (SYNTHROID, LEVOTHROID) 75 MCG tablet Take 1 tablet (75 mcg total) by mouth daily before breakfast.  . mirtazapine (REMERON) 15 MG  tablet Take 1 tablet (15 mg total) by mouth at bedtime.  Marland Kitchen omeprazole (PRILOSEC) 40 MG capsule Take 1 capsule (40 mg total) by mouth daily.  . ondansetron (ZOFRAN) 4 MG tablet Take 4 mg by mouth every 8 (eight) hours as needed for nausea. For nausea  . oxyCODONE (OXYCONTIN) 10 mg 12 hr tablet Take 10 mg by mouth every 12 (twelve) hours.  Marland Kitchen oxyCODONE-acetaminophen (PERCOCET) 5-325 MG tablet Take 1 tablet by mouth every 4 (four) hours as needed for severe pain.  . pregabalin (LYRICA) 50 MG capsule Take 50 mg by mouth 2 (two) times daily.  Marland Kitchen topiramate (TOPAMAX) 100 MG tablet Take 100 mg by mouth 2 (two) times daily.  . [DISCONTINUED] diazepam (VALIUM) 5 MG tablet Take 1 tablet po bid prn for muscle pain and tightness.  . [DISCONTINUED] diazepam (VALIUM) 5 MG tablet Take 1 tablet po bid prn for muscle pain and tightness.   No facility-administered encounter medications on file as of 07/08/2017.       Medical History: Past Medical History:  Diagnosis Date  . Anxiety   . Arthritis    "pretty much from my neck to my feet" (07/08/2012)  . B12 deficiency anemia   . Chronic lower back pain   . Depression   . Diabetic peripheral neuropathy ([place])   . Gout   .  High cholesterol   . 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 Neuromuscular disorder ([place])   . Rheumatoid arthritis ([place])   . Type II diabetes mellitus (HCC)    recent hgb A1C was 5.4 per Dr. Humphrey Rolls, [place], [place]     Vital Signs: BP (!) 148/78   Pulse 82   Resp 16   Ht 5' 10"  (1.778 m)   Wt 218 lb 3.2 oz (99 kg)   SpO2 98%   BMI 31.31 kg/m    Review of Systems  Constitutional: Positive for activity change and fatigue.  HENT: Negative for congestion, postnasal drip, rhinorrhea and sore throat.   Eyes: Negative.   Respiratory: Negative for chest tightness, shortness of breath and wheezing.   Cardiovascular: Negative for chest pain, palpitations and leg swelling.  Gastrointestinal: Negative for abdominal  pain, diarrhea, nausea and vomiting.  Endocrine:       Blood sugars doing well   Genitourinary: Positive for dysuria and frequency.       Can feel her bladder in the vaginal opening when she uses the bathroom.   Musculoskeletal: Positive for arthralgias, back pain, gait problem and neck pain.       Left ankle pain and swelling. Has noted discoloration to the left ankle.   Skin: Negative for rash.  Allergic/Immunologic: Negative for environmental allergies.  Neurological: Negative for weakness, numbness and headaches.  Hematological: Negative for adenopathy.  Psychiatric/Behavioral: The patient is nervous/anxious.     Physical Exam  Constitutional: She is oriented to person, place, and time. She appears well-developed and well-nourished. No distress.  HENT:  Head: Normocephalic and atraumatic.  Mouth/Throat: Oropharynx is clear and moist. No oropharyngeal exudate.  Eyes: Pupils are equal, round, and reactive to light. EOM are normal.  Neck: Normal range of motion. Neck supple. No JVD present. No tracheal deviation present. No thyromegaly present.  Cardiovascular: Normal rate, regular rhythm and normal heart sounds. Exam reveals no gallop and no friction rub.  No murmur heard. Pulmonary/Chest: Effort normal and breath sounds normal. No respiratory distress. She has no wheezes. She has no rales. She exhibits no tenderness.  Abdominal: Soft. Bowel sounds are normal. There is no tenderness.  Genitourinary:    Pelvic exam was performed with patient supine.  Genitourinary Comments: Urine sample positive for moderate white blood cells.   Musculoskeletal: Normal range of motion.  Lymphadenopathy:    She has no cervical adenopathy.  Neurological: She is alert and oriented to person, place, and time. No cranial nerve deficit.  Skin: Skin is warm and dry. She is not diaphoretic.  Psychiatric: She has a normal mood and affect. Her behavior is normal. Judgment and thought content normal.    Nursing note and vitals reviewed.  Assessment/Plan: 1. Urinary tract infection without hematuria, site unspecified Treat with cipro twice daily for 10 days. Will adust abx based on culture and sensitivity results.  - ciprofloxacin (CIPRO) 500 MG tablet; Take 1 tablet (500 mg total) by mouth 2 (two) times daily.  Dispense: 20 tablet; Refill: 0 - CULTURE, URINE COMPREHENSIVE  2. Bladder prolapse, female, acquired Referred patient to urologist she saw initially, when she had bladder prolapse, for further evaluation and treatment.  - Ambulatory referral to Urology  3. Vaginal candidiasis - fluconazole (DIFLUCAN) 150 MG tablet; Take 1 tablet po once. May repeat dose in 3 days as needed for persistent symptoms.  Dispense: 3 tablet; Refill: 0  4. Generalized anxiety disorder with panic attacks - diazepam (VALIUM) 5  MG tablet; Take 1 tablet po bid prn for muscle pain and tightness.  Dispense: 60 tablet; Refill: 2  5. Dysuria - POCT Urinalysis Dipstick  6. Controlled type 2 diabetes mellitus without complication, without long-term current use of insulin ([place]) Continue diabetic medications as prescribed.   General Counseling: ariyanna oien understanding of the findings of todays visit and agrees with plan of treatment. I have discussed any further diagnostic evaluation that may be needed or ordered today. We also reviewed her medications today. she has been encouraged to call the office with any questions or concerns that should arise related to todays visit.  This patient was seen by Leretha Pol, FNP- C in Collaboration with Dr Lavera Guise as a part of collaborative care agreement    Orders Placed This Encounter  Procedures  . CULTURE, URINE COMPREHENSIVE  . Ambulatory referral to Urology  . POCT Urinalysis Dipstick    Meds ordered this encounter  Medications  . ciprofloxacin (CIPRO) 500 MG tablet    Sig: Take 1 tablet (500 mg total) by mouth 2 (two) times daily.    Dispense:   20 tablet    Refill:  0    Order Specific Question:   Supervising Provider    Answer:   Lavera Guise [[id number]]  . fluconazole (DIFLUCAN) 150 MG tablet    Sig: Take 1 tablet po once. May repeat dose in 3 days as needed for persistent symptoms.    Dispense:  3 tablet    Refill:  0    Order Specific Question:   Supervising Provider    Answer:   Lavera Guise [[id number]]  . DISCONTD: diazepam (VALIUM) 5 MG tablet    Sig: Take 1 tablet po bid prn for muscle pain and tightness.    Dispense:  60 tablet    Refill:  2    Take 1 tab po bid prn for muscle pain and tightness.    Order Specific Question:   Supervising Provider    Answer:   Lavera Guise [[id number]]  . diazepam (VALIUM) 5 MG tablet    Sig: Take 1 tablet po bid prn for muscle pain and tightness.    Dispense:  60 tablet    Refill:  2    Take 1 tab po bid prn for muscle pain and tightness.    Order Specific Question:   Supervising Provider    Answer:   Lavera Guise [[id number]]    Time spent:25 Minutes

## 2017-07-09 DIAGNOSIS — R319 Hematuria, unspecified: Secondary | ICD-10-CM

## 2017-07-09 DIAGNOSIS — R300 Dysuria: Secondary | ICD-10-CM | POA: Insufficient documentation

## 2017-07-09 DIAGNOSIS — B3731 Acute candidiasis of vulva and vagina: Secondary | ICD-10-CM | POA: Insufficient documentation

## 2017-07-09 DIAGNOSIS — F410 Panic disorder [episodic paroxysmal anxiety] without agoraphobia: Secondary | ICD-10-CM | POA: Insufficient documentation

## 2017-07-09 DIAGNOSIS — B373 Candidiasis of vulva and vagina: Secondary | ICD-10-CM | POA: Insufficient documentation

## 2017-07-09 DIAGNOSIS — N390 Urinary tract infection, site not specified: Secondary | ICD-10-CM | POA: Insufficient documentation

## 2017-07-09 DIAGNOSIS — N8110 Cystocele, unspecified: Secondary | ICD-10-CM | POA: Insufficient documentation

## 2017-07-09 DIAGNOSIS — F411 Generalized anxiety disorder: Secondary | ICD-10-CM

## 2017-07-11 ENCOUNTER — Emergency Department (HOSPITAL_COMMUNITY): Payer: Medicaid Other

## 2017-07-11 ENCOUNTER — Emergency Department (HOSPITAL_COMMUNITY)
Admission: EM | Admit: 2017-07-11 | Discharge: 2017-07-11 | Disposition: A | Discharge status: Home / Self Care | Payer: Medicaid Other | Attending: Emergency Medicine | Admitting: Emergency Medicine

## 2017-07-11 ENCOUNTER — Encounter (HOSPITAL_COMMUNITY): Payer: Self-pay

## 2017-07-11 ENCOUNTER — Other Ambulatory Visit: Payer: Self-pay

## 2017-07-11 DIAGNOSIS — Y929 Unspecified place or not applicable: Secondary | ICD-10-CM | POA: Diagnosis not present

## 2017-07-11 DIAGNOSIS — W07XXXA Fall from chair, initial encounter: Secondary | ICD-10-CM | POA: Diagnosis not present

## 2017-07-11 DIAGNOSIS — Y999 Unspecified external cause status: Secondary | ICD-10-CM | POA: Insufficient documentation

## 2017-07-11 DIAGNOSIS — Y939 Activity, unspecified: Secondary | ICD-10-CM | POA: Insufficient documentation

## 2017-07-11 DIAGNOSIS — Z79899 Other long term (current) drug therapy: Secondary | ICD-10-CM | POA: Insufficient documentation

## 2017-07-11 DIAGNOSIS — E119 Type 2 diabetes mellitus without complications: Secondary | ICD-10-CM | POA: Insufficient documentation

## 2017-07-11 DIAGNOSIS — S161XXA Strain of muscle, fascia and tendon at neck level, initial encounter: Secondary | ICD-10-CM | POA: Diagnosis present

## 2017-07-11 DIAGNOSIS — F17210 Nicotine dependence, cigarettes, uncomplicated: Secondary | ICD-10-CM | POA: Insufficient documentation

## 2017-07-11 DIAGNOSIS — E039 Hypothyroidism, unspecified: Secondary | ICD-10-CM | POA: Insufficient documentation

## 2017-07-11 DIAGNOSIS — Z7982 Long term (current) use of aspirin: Secondary | ICD-10-CM | POA: Diagnosis not present

## 2017-07-11 DIAGNOSIS — W19XXXA Unspecified fall, initial encounter: Secondary | ICD-10-CM

## 2017-07-11 LAB — CULTURE, URINE COMPREHENSIVE

## 2017-07-11 MED ORDER — OXYCODONE-ACETAMINOPHEN 5-325 MG PO TABS
1.0000 | ORAL_TABLET | Freq: Once | ORAL | Status: AC
  Administered 2017-07-11: 1 via ORAL
  Filled 2017-07-11: qty 1

## 2017-07-11 NOTE — ED Triage Notes (Signed)
Pt arrived via [facility] from home where pt was sitting in rocking chair with family when rocking chair broke. Pt was wedged between patio and brick wall. C/o neck and back pain with hx of C4-C7 repair and lumbar fusion. Family denies any LOC but reports pt was confused initially after fall. Denies being on blood thinners. Pain 7/10 for EMS, no meds given PTA. Pt also c/o tingling in right leg.

## 2017-07-11 NOTE — ED Notes (Signed)
Patient transported to CT 

## 2017-07-11 NOTE — ED Provider Notes (Signed)
[facility] EMERGENCY DEPARTMENT Provider Note   CSN: [id number] Arrival date & time: 07/11/17  1734  History   Chief Complaint Chief Complaint  Patient presents with  . Fall  . Neck Pain    HPI Holly Espinoza is a 57 y.o. female with a past medical history of chronic neck back pain with history of prior C4-C7 fusion, diabetes, GERD, hypothyroidism, hyperlipidemia, anxiety who presented to the ED following a mechanical fall.   She was in a rocking chair earlier today and states she fell back or the chair broke, hitting her head on the ground and falling near where the ground met a brick wall and was stuck in an awkward position with her head up against the wall. No LOC.  Has neck and back pain, small abrasion to left elbow, notes tingling in right leg which has improved since initial period after the fall.  No current weakness, confusion, n/v.   Past Medical History:  Diagnosis Date  . Anxiety   . Arthritis    "pretty much from my neck to my feet" (07/08/2012)  . B12 deficiency anemia   . Chronic lower back pain   . Depression   . Diabetic peripheral neuropathy ([place])   . Gout   . High cholesterol   . 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 Neuromuscular disorder ([place])   . Rheumatoid arthritis ([place])   . Type II diabetes mellitus (HCC)    recent hgb A1C was 5.4 per Dr. Humphrey Rolls, [place], [place]    Patient Active Problem List   Diagnosis Date Noted  . Urinary tract infection without hematuria 07/09/2017  . Bladder prolapse, female, acquired 07/09/2017  . Vaginal candidiasis 07/09/2017  . Generalized anxiety disorder with panic attacks 07/09/2017  . Dysuria 07/09/2017  . Acute left ankle pain 06/08/2017  . Gastroesophageal reflux disease without esophagitis 06/08/2017  . Moderate episode of recurrent major depressive disorder ([place]) 06/08/2017  . Acute non-recurrent pansinusitis 04/16/2017  . Flu-like symptoms 04/16/2017  . Cervicalgia  04/16/2017  . Rectocele 11/01/2015  . Arthritis 08/02/2015  . Controlled type 2 diabetes mellitus without complication ([place]) 35/45/6256  . Neuralgia neuritis, sciatic nerve 08/02/2015  . B12 deficiency 08/02/2015  . Lobular carcinoma in situ of breast 02/16/2013  . Post menopausal syndrome 09/24/2012  . Absolute anemia 09/18/2012  . Edema leg 09/03/2012  . Protein-calorie malnutrition, severe ([place]) 08/15/2012  . Back ache 01/14/2012  . Diabetes mellitus without complication ([place]) 38/93/7342  . HLD (hyperlipidemia) 01/14/2012  . BP (high blood pressure) 01/14/2012  . Flu vaccine need 01/14/2012  . Diabetic peripheral neuropathy associated with type 2 diabetes mellitus ([place]) 10/12/2011  . Abnormal kidney function study 06/28/2011  . Benign neoplasm of soft tissues 05/31/2011    Past Surgical History:  Procedure Laterality Date  . ABDOMINAL HYSTERECTOMY  2001?  . ANTERIOR AND POSTERIOR REPAIR N/A 11/01/2015   Procedure: CYSTOSCOPY, ANTERIOR (CYSTOCELE) AND POSTERIOR REPAIR (RECTOCELE);  Surgeon: Bjorn Loser, MD;  Location: [facility];  Service: Urology;  Laterality: N/A;  . ANTERIOR CERVICAL DECOMP/DISCECTOMY FUSION  2012  . BACK SURGERY  2001   laminectomy  . BREAST BIOPSY Left 2014  . BREAST LUMPECTOMY Left 02/25/2013  . BREAST SURGERY Left 02/25/13   lumpectomy  . CHOLECYSTECTOMY  1980's  . DILATION AND CURETTAGE OF UTERUS  [phone]   "probably 3" (07/08/2012)  . FRACTURE SURGERY     C4-7 Surgery for fracture  . LUMBAR WOUND DEBRIDEMENT N/A 08/12/2012  Procedure: LUMBAR WOUND DEBRIDEMENT;  Surgeon: Faythe Ghee, MD;  Location: [facility];  Service: Neurosurgery;  Laterality: N/A;  Lumbar Wound Debridement  . POSTERIOR LUMBAR FUSION  07/08/2012  . TONSILLECTOMY  ~ 1968     OB History    Gravida  2   Para  2   Term      Preterm      AB      Living  2     SAB      TAB      Ectopic      Multiple      Live Births           Obstetric Comments    1st Menstrual Cycle:  10 1st Pregnancy:  15         Home Medications    Prior to Admission medications   Medication Sig Start Date End Date Taking? Authorizing Provider  Colchicine (MITIGARE) 0.6 MG CAPS Take 1.2 mg by mouth See admin instructions. At onset of gout flare. May repeat one additional capsule 1 hour later if symptoms persist. 06/13/17  Yes [provider]  allopurinol (ZYLOPRIM) 100 MG tablet Take 2 tablets (200 mg total) by mouth at bedtime. 04/04/17   Ronnell Freshwater, NP  aspirin 81 MG chewable tablet Chew 81 mg by mouth daily.    [provider]  ciprofloxacin (CIPRO) 500 MG tablet Take 1 tablet (500 mg total) by mouth 2 (two) times daily. 07/08/17   Ronnell Freshwater, NP  Cyanocobalamin (B-12) 1000 MCG/ML KIT Inject 1,000 mcg as directed every 30 (thirty) days. 06/19/17   Ronnell Freshwater, NP  diazepam (VALIUM) 5 MG tablet Take 1 tablet po bid prn for muscle pain and tightness. 07/08/17   Ronnell Freshwater, NP  DULoxetine (CYMBALTA) 60 MG capsule Take 1 capsule (60 mg total) by mouth daily. 04/04/17   Ronnell Freshwater, NP  Exenatide ER (BYDUREON) 2 MG PEN Inject 2 mg into the skin once a week. 04/29/17   Ronnell Freshwater, NP  fluconazole (DIFLUCAN) 150 MG tablet Take 1 tablet po once. May repeat dose in 3 days as needed for persistent symptoms. 07/08/17   Ronnell Freshwater, NP  furosemide (LASIX) 20 MG tablet Take 1 tablet (20 mg total) by mouth daily as needed for fluid or edema. 04/04/17   Ronnell Freshwater, NP  gemfibrozil (LOPID) 600 MG tablet Take 1 tablet (600 mg total) by mouth 2 (two) times daily before a meal. 06/26/17   Ronnell Freshwater, NP  levothyroxine (SYNTHROID, LEVOTHROID) 75 MCG tablet Take 1 tablet (75 mcg total) by mouth daily before breakfast. 04/04/17   Ronnell Freshwater, NP  mirtazapine (REMERON) 15 MG tablet Take 1 tablet (15 mg total) by mouth at bedtime. 04/04/17   Ronnell Freshwater, NP  omeprazole (PRILOSEC) 40 MG capsule Take 1 capsule  (40 mg total) by mouth daily. 05/17/17   Ronnell Freshwater, NP  ondansetron (ZOFRAN) 4 MG tablet Take 4 mg by mouth every 8 (eight) hours as needed for nausea. For nausea    [provider]  oxyCODONE (OXYCONTIN) 10 mg 12 hr tablet Take 10 mg by mouth every 12 (twelve) hours.    [provider]  oxyCODONE-acetaminophen (PERCOCET) 5-325 MG tablet Take 1 tablet by mouth every 4 (four) hours as needed for severe pain. 05/14/17   Rudene Re, MD  pregabalin (LYRICA) 50 MG capsule Take 50 mg by mouth 2 (two) times daily.  [provider]  topiramate (TOPAMAX) 100 MG tablet Take 100 mg by mouth 2 (two) times daily.    [provider]    Family History Family History  Problem Relation Age of Onset  . Kidney disease Father   . Cancer Other        breast great grandmother  . Breast cancer Other     Social History Social History   Tobacco Use  . Smoking status: Current Every Day Smoker    Packs/day: 0.50    Years: 40.00    Pack years: 20.00    Types: Cigarettes  . Smokeless tobacco: Never Used  Substance Use Topics  . Alcohol use: No  . Drug use: No    Types: Marijuana, Cocaine    Comment: 07/08/2012 "last drug use >8 yr ago" (07/08/2012)     Allergies   Patient has no known allergies.   Review of Systems Review of Systems  Respiratory: Negative for shortness of breath.   Musculoskeletal: Positive for back pain and neck pain.  Neurological: Positive for numbness. Negative for dizziness, syncope, facial asymmetry and weakness.     Physical Exam Updated Vital Signs BP 115/76   Pulse 68   Temp 98.5 F (36.9 C) (Oral)   Resp 16   Ht 5' 9" (1.753 m)   Wt 98.9 kg (218 lb)   SpO2 98%   BMI 32.19 kg/m   General: Resting in stretcher with C collar in place, no acute distress Head: Normocephalic, no evidence of laceration or hematoma  Eyes: PERRL, EOMI  ENT: C collar in place, no trauma noted to oropharynx or face  CV: RRR  Resp:  Clear anterior breath sounds bilaterally, normal work of breathing, no distress  Abd: Soft, +BS, obese, non-tender  Extr: No LE edema, small abrasion to L olecranon, pain with strength testing   Neuro: Alert and oriented x3, no facial asymmetry, normal FTN, EOMI. 5/5 strength in bilateral upper and lower extremities. Subjective decreased sensation to R upper and lower extremities-dullness   Skin: Warm, dry      ED Treatments / Results  Labs (all labs ordered are listed, but only abnormal results are displayed) Labs Reviewed - No data to display  EKG None  Radiology Ct Cervical Spine Wo Contrast  Result Date: 07/11/2017 CLINICAL DATA:  Neck pain, status post fall, prior C4-7 repair EXAM: CT CERVICAL SPINE WITHOUT CONTRAST TECHNIQUE: Multidetector CT imaging of the cervical spine was performed without intravenous contrast. Multiplanar CT image reconstructions were also generated. COMPARISON:  MR cervical spine dated 01/21/2017 FINDINGS: Alignment: Normal cervical lordosis. Skull base and vertebrae: No acute fracture. No primary bone lesion or focal pathologic process. Soft tissues and spinal canal: No prevertebral fluid or swelling. No visible canal hematoma. Disc levels: Status post C4-7 ACDF, without evidence of hardware complication. Mild degenerative changes. Spinal canal is patent. Upper chest: Visualized lung apices are clear. Other: Visualized thyroid is unremarkable. IMPRESSION: Status post C4-7 ACDF, without evidence of hardware complication. No evidence of traumatic injury to the cervical spine. Electronically Signed   By: Julian Hy M.D.   On: 07/11/2017 19:24   Ct Thoracic Spine Wo Contrast  Result Date: 07/11/2017 CLINICAL DATA:  History of lumbar fusion and back pain, recent fall EXAM: CT THORACIC AND LUMBAR SPINE WITHOUT CONTRAST TECHNIQUE: Multidetector CT imaging of the thoracic and lumbar spine was performed without contrast. Multiplanar CT image reconstructions were also  generated. COMPARISON:  Radiograph 09/15/2012, CT lumbar spine 06/05/2012 FINDINGS: CT THORACIC  SPINE FINDINGS Alignment: Normal. Vertebrae: No acute fracture or focal pathologic process. Paraspinal and other soft tissues: No paravertebral or paraspinal soft tissue abnormality. Aortic atherosclerosis. Visible lung fields are clear. No thyroid mass. Disc levels: Mild vacuum disc at T6-T7 and T7-T8. Mild degenerative anterior osteophytes. CT LUMBAR SPINE FINDINGS Segmentation: 5 lumbar type vertebrae. Alignment: Normal. Vertebrae: Mild age indeterminate superior endplate deformity at L1. Remaining vertebral body heights are normal. Paraspinal and other soft tissues: Aortic atherosclerosis. Sigmoid colon diverticular disease. No paravertebral or paraspinal soft tissue abnormality. Disc levels: Status post posterior rod and pedicle screw fixation L3 through L5 with interbody devices at L3-L4 and L4-L5. posterior decompression changes L3-L4 and L4-L5. Mild disc space narrowing at L1-L2 and L2-L3. Broad-based disc bulge at L2-L3 with moderate canal stenosis. Posterior facet arthropathy with thickening and calcification of ligamentum flavum. Small central disc protrusion at L5-S1. Prominent facet degenerative changes. No high-grade canal stenosis. IMPRESSION: CT THORACIC SPINE IMPRESSION Mild degenerative changes.  No acute osseous abnormality. CT LUMBAR SPINE IMPRESSION Status post posterior rod and screw fixation L3 through L5 with intact appearing hardware. Age indeterminate mild superior endplate deformity at L1. Electronically Signed   By: Donavan Foil M.D.   On: 07/11/2017 19:56   Ct Lumbar Spine Wo Contrast  Result Date: 07/11/2017 CLINICAL DATA:  History of lumbar fusion and back pain, recent fall EXAM: CT THORACIC AND LUMBAR SPINE WITHOUT CONTRAST TECHNIQUE: Multidetector CT imaging of the thoracic and lumbar spine was performed without contrast. Multiplanar CT image reconstructions were also generated.  COMPARISON:  Radiograph 09/15/2012, CT lumbar spine 06/05/2012 FINDINGS: CT THORACIC SPINE FINDINGS Alignment: Normal. Vertebrae: No acute fracture or focal pathologic process. Paraspinal and other soft tissues: No paravertebral or paraspinal soft tissue abnormality. Aortic atherosclerosis. Visible lung fields are clear. No thyroid mass. Disc levels: Mild vacuum disc at T6-T7 and T7-T8. Mild degenerative anterior osteophytes. CT LUMBAR SPINE FINDINGS Segmentation: 5 lumbar type vertebrae. Alignment: Normal. Vertebrae: Mild age indeterminate superior endplate deformity at L1. Remaining vertebral body heights are normal. Paraspinal and other soft tissues: Aortic atherosclerosis. Sigmoid colon diverticular disease. No paravertebral or paraspinal soft tissue abnormality. Disc levels: Status post posterior rod and pedicle screw fixation L3 through L5 with interbody devices at L3-L4 and L4-L5. posterior decompression changes L3-L4 and L4-L5. Mild disc space narrowing at L1-L2 and L2-L3. Broad-based disc bulge at L2-L3 with moderate canal stenosis. Posterior facet arthropathy with thickening and calcification of ligamentum flavum. Small central disc protrusion at L5-S1. Prominent facet degenerative changes. No high-grade canal stenosis. IMPRESSION: CT THORACIC SPINE IMPRESSION Mild degenerative changes.  No acute osseous abnormality. CT LUMBAR SPINE IMPRESSION Status post posterior rod and screw fixation L3 through L5 with intact appearing hardware. Age indeterminate mild superior endplate deformity at L1. Electronically Signed   By: Donavan Foil M.D.   On: 07/11/2017 19:56    Procedures Procedures (including critical care time)  Medications Ordered in ED Medications  oxyCODONE-acetaminophen (PERCOCET/ROXICET) 5-325 MG per tablet 1 tablet (1 tablet Oral Given 07/11/17 1846)     Initial Impression / Assessment and Plan / ED Course  I have reviewed the triage vital signs and the nursing notes.  Pertinent labs &  imaging results that were available during my care of the patient were reviewed by me and considered in my medical decision making (see chart for details).  57 year old female presenting with acute on chronic neck and back pain following a mechanical fall due to a chair breaking.  Neuro exam is unremarkable apart from  subjective dullness to light touch on the right upper and lower extremity.  She does have a history of prior cervical and lumbar surgery.  Will obtain CT imaging of spine to rule out acute fracture, administer p.o. pain control.   CT scan showed no acute fractures and cervical thoracic or lumbar spine.  Did have age-indeterminate superior endplate deformity, unlikely to been related to this acute presentation based on mechanism and area of most severe symptoms.  All hardware intact.  Educated reassured patient that pain is likely musculoskeletal strain as result of the fall, recommend rest, ice/heat, Tylenol, anti-inflammatory for continued recovery.  Patient already on narcotic pain medication chronically, as well as Valium that is prescribed as a muscle relaxer according to med reconciliation. Pt expressed understanding and agreement with plan   Final Clinical Impressions(s) / ED Diagnoses   Final diagnoses:  Fall, initial encounter  Acute strain of neck muscle, initial encounter    ED Discharge Orders    None       Tawny Asal, MD 07/11/17 2051    Carmin Muskrat, MD 07/12/17 [phone]

## 2017-07-11 NOTE — Discharge Instructions (Signed)
Nice to meet you Holly Espinoza.  If her scans showed all hardware was in place and there were no bone breaks.  You likely strained some your muscles in your neck and back that is worsened your typical neck pain.  This should get better with time but tomorrow he may be very sore.  Continue to use your home pain medication, and diazepam which can help relax in the muscles.  On top of this, I will be important to use over-the-counter anti-inflammatory medications and Tylenol.  Also, alternating between ice and heat can help relax the muscles and decrease inflammation.  Try to avoid heavy lifting or exerting himself but try to stretch your muscles as you can tolerate.

## 2017-08-19 ENCOUNTER — Ambulatory Visit: Payer: Self-pay | Admitting: Urology

## 2017-08-19 NOTE — Progress Notes (Deleted)
08/19/2017 12:33 PM   Holly Espinoza 07-31-1960 [id number]  Referring provider: Ronnell Freshwater, NP [address] [place], [place] [address]  No chief complaint on file.   HPI: Patient is a 57 year old Caucasian female who was referred by Leretha Pol, NP for prolapse bladder.  She is post menopausal and has a history of breast cancer.        Reviewed referral notes.    PMH: Past Medical History:  Diagnosis Date  . Anxiety   . Arthritis    "pretty much from my neck to my feet" (07/08/2012)  . B12 deficiency anemia   . Chronic lower back pain   . Depression   . Diabetic peripheral neuropathy ([place])   . Gout   . High cholesterol   . 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 Neuromuscular disorder ([place])   . Rheumatoid arthritis ([place])   . Type II diabetes mellitus (HCC)    recent hgb A1C was 5.4 per Dr. Humphrey Rolls, [place], [place]    Surgical History: Past Surgical History:  Procedure Laterality Date  . ABDOMINAL HYSTERECTOMY  2001?  . ANTERIOR AND POSTERIOR REPAIR N/A 11/01/2015   Procedure: CYSTOSCOPY, ANTERIOR (CYSTOCELE) AND POSTERIOR REPAIR (RECTOCELE);  Surgeon: Bjorn Loser, MD;  Location: [facility];  Service: Urology;  Laterality: N/A;  . ANTERIOR CERVICAL DECOMP/DISCECTOMY FUSION  2012  . BACK SURGERY  2001   laminectomy  . BREAST BIOPSY Left 2014  . BREAST LUMPECTOMY Left 02/25/2013  . BREAST SURGERY Left 02/25/13   lumpectomy  . CHOLECYSTECTOMY  1980's  . DILATION AND CURETTAGE OF UTERUS  [phone]   "probably 3" (07/08/2012)  . FRACTURE SURGERY     C4-7 Surgery for fracture  . LUMBAR WOUND DEBRIDEMENT N/A 08/12/2012   Procedure: LUMBAR WOUND DEBRIDEMENT;  Surgeon: Faythe Ghee, MD;  Location: [facility];  Service: Neurosurgery;  Laterality: N/A;  Lumbar Wound Debridement  . POSTERIOR LUMBAR FUSION  07/08/2012  . TONSILLECTOMY  ~ 1968    Home Medications:  Allergies as of 08/19/2017   No Known Allergies     Medication  List        Accurate as of 08/19/17 12:33 PM. Always use your most recent med list.          allopurinol 100 MG tablet Commonly known as:  ZYLOPRIM Take 2 tablets (200 mg total) by mouth at bedtime.   aspirin 81 MG chewable tablet Chew 81 mg by mouth daily.   B-12 1000 MCG/ML Kit Inject 1,000 mcg as directed every 30 (thirty) days.   ciprofloxacin 500 MG tablet Commonly known as:  CIPRO Take 1 tablet (500 mg total) by mouth 2 (two) times daily.   diazepam 5 MG tablet Commonly known as:  VALIUM Take 1 tablet po bid prn for muscle pain and tightness.   DULoxetine 60 MG capsule Commonly known as:  CYMBALTA Take 1 capsule (60 mg total) by mouth daily.   Exenatide ER 2 MG Pen Commonly known as:  BYDUREON Inject 2 mg into the skin once a week.   fluconazole 150 MG tablet Commonly known as:  DIFLUCAN Take 1 tablet po once. May repeat dose in 3 days as needed for persistent symptoms.   furosemide 20 MG tablet Commonly known as:  LASIX Take 1 tablet (20 mg total) by mouth daily as needed for fluid or edema.   gemfibrozil 600 MG tablet Commonly known as:  LOPID Take 1 tablet (600 mg total) by mouth  2 (two) times daily before a meal.   levothyroxine 75 MCG tablet Commonly known as:  SYNTHROID, LEVOTHROID Take 1 tablet (75 mcg total) by mouth daily before breakfast.   mirtazapine 15 MG tablet Commonly known as:  REMERON Take 1 tablet (15 mg total) by mouth at bedtime.   MITIGARE 0.6 MG Caps Generic drug:  Colchicine Take 1.2 mg by mouth See admin instructions. At onset of gout flare. May repeat one additional capsule 1 hour later if symptoms persist.   omeprazole 40 MG capsule Commonly known as:  PRILOSEC Take 1 capsule (40 mg total) by mouth daily.   ondansetron 4 MG tablet Commonly known as:  ZOFRAN Take 4 mg by mouth every 8 (eight) hours as needed for nausea. For nausea   oxyCODONE 10 mg 12 hr tablet Commonly known as:  OXYCONTIN Take 10 mg by mouth every 12  (twelve) hours.   oxyCODONE-acetaminophen 5-325 MG tablet Commonly known as:  PERCOCET Take 1 tablet by mouth every 4 (four) hours as needed for severe pain.   pregabalin 50 MG capsule Commonly known as:  LYRICA Take 50 mg by mouth 2 (two) times daily.   topiramate 100 MG tablet Commonly known as:  TOPAMAX Take 100 mg by mouth 2 (two) times daily.       Allergies: No Known Allergies  Family History: Family History  Problem Relation Age of Onset  . Kidney disease Father   . Cancer Other        breast great grandmother  . Breast cancer Other     Social History:  reports that she has been smoking cigarettes.  She has a 20.00 pack-year smoking history. She has never used smokeless tobacco. She reports that she does not drink alcohol or use drugs.  ROS:                                        Physical Exam: There were no vitals taken for this visit.  Constitutional:  Well nourished. Alert and oriented, No acute distress. HEENT: [place] AT, moist mucus membranes.  Trachea midline, no masses. Cardiovascular: No clubbing, cyanosis, or edema. Respiratory: Normal respiratory effort, no increased work of breathing. GI: Abdomen is soft, non tender, non distended, no abdominal masses. Liver and spleen not palpable.  No hernias appreciated.  Stool sample for occult testing is not indicated.   GU: No CVA tenderness.  No bladder fullness or masses.  Normal external genitalia, normal pubic hair distribution, no lesions.  Normal urethral meatus, no lesions, no prolapse, no discharge.   No urethral masses, tenderness and/or tenderness. No bladder fullness, tenderness or masses. Normal vagina mucosa, good estrogen effect, no discharge, no lesions, good pelvic support, no cystocele or rectocele noted.  No cervical motion tenderness.  Uterus is freely mobile and non-fixed.  No adnexal/parametria masses or tenderness noted.  Anus and perineum are without rashes or lesions.    *** Skin: No rashes, bruises or suspicious lesions. Lymph: No cervical or inguinal adenopathy. Neurologic: Grossly intact, no focal deficits, moving all 4 extremities. Psychiatric: Normal mood and affect.  Laboratory Data: Lab Results  Component Value Date   WBC 7.7 05/14/2017   HGB 12.0 05/14/2017   HCT 36.3 05/14/2017   MCV 84.8 05/14/2017   PLT 231 05/14/2017    Lab Results  Component Value Date   CREATININE 1.13 (H) 05/14/2017    No results found  for: PSA  No results found for: TESTOSTERONE  Lab Results  Component Value Date   HGBA1C 6.0 05/17/2017    No results found for: TSH  No results found for: CHOL, HDL, CHOLHDL, VLDL, LDLCALC  Lab Results  Component Value Date   AST 31 05/14/2017   Lab Results  Component Value Date   ALT 22 05/14/2017   No components found for: ALKALINEPHOPHATASE No components found for: BILIRUBINTOTAL  No results found for: ESTRADIOL  Urinalysis    Component Value Date/Time   COLORURINE Colorless 08/21/2012 1318   APPEARANCEUR Clear 07/04/2015 0948   LABSPEC 1.005 08/21/2012 1318   PHURINE 6.0 08/21/2012 1318   GLUCOSEU Negative 07/04/2015 0948   GLUCOSEU Negative 08/21/2012 1318   HGBUR Negative 08/21/2012 1318   BILIRUBINUR negative 07/08/2017 1602   BILIRUBINUR Negative 07/04/2015 0948   BILIRUBINUR Negative 08/21/2012 1318   KETONESUR Negative 08/21/2012 1318   PROTEINUR neg 07/08/2017 1602   PROTEINUR Negative 07/04/2015 0948   PROTEINUR Negative 08/21/2012 1318   UROBILINOGEN negative (A) 07/08/2017 1602   NITRITE neg 07/08/2017 1602   NITRITE Negative 07/04/2015 0948   NITRITE Negative 08/21/2012 1318   LEUKOCYTESUR Moderate (2+) (A) 07/08/2017 1602   LEUKOCYTESUR Negative 07/04/2015 0948   LEUKOCYTESUR Negative 08/21/2012 1318    I have reviewed the labs.   Pertinent Imaging: *** I have independently reviewed the films.    Assessment & Plan:  ***  1. Bladder prolapse   No follow-ups on  file.  These notes generated with voice recognition software. I apologize for typographical errors.  Zara Council, PA-C  [facility] [address]  [place], [place] [address] [phone]

## 2017-08-21 ENCOUNTER — Other Ambulatory Visit: Payer: Self-pay | Admitting: Nurse Practitioner

## 2017-08-21 MED ORDER — ALLOPURINOL 100 MG PO TABS: 200.0000 mg | ORAL_TABLET | Freq: Every day | ORAL | 3 refills | Status: DC

## 2017-08-26 ENCOUNTER — Ambulatory Visit: Payer: Medicaid Other | Admitting: Nurse Practitioner

## 2017-08-26 ENCOUNTER — Encounter: Payer: Self-pay | Admitting: Nurse Practitioner

## 2017-08-26 VITALS — BP 93/64 | HR 84 | Resp 16 | Ht 68.0 in | Wt 206.0 lb

## 2017-08-26 DIAGNOSIS — R300 Dysuria: Secondary | ICD-10-CM | POA: Diagnosis not present

## 2017-08-26 DIAGNOSIS — N390 Urinary tract infection, site not specified: Secondary | ICD-10-CM

## 2017-08-26 DIAGNOSIS — E1165 Type 2 diabetes mellitus with hyperglycemia: Secondary | ICD-10-CM | POA: Diagnosis not present

## 2017-08-26 DIAGNOSIS — R609 Edema, unspecified: Secondary | ICD-10-CM | POA: Diagnosis not present

## 2017-08-26 DIAGNOSIS — F410 Panic disorder [episodic paroxysmal anxiety] without agoraphobia: Secondary | ICD-10-CM

## 2017-08-26 DIAGNOSIS — J0140 Acute pansinusitis, unspecified: Secondary | ICD-10-CM

## 2017-08-26 DIAGNOSIS — N8110 Cystocele, unspecified: Secondary | ICD-10-CM | POA: Diagnosis not present

## 2017-08-26 DIAGNOSIS — F411 Generalized anxiety disorder: Secondary | ICD-10-CM

## 2017-08-26 MED ORDER — PHENAZOPYRIDINE HCL 200 MG PO TABS: 200.0000 mg | ORAL_TABLET | Freq: Three times a day (TID) | ORAL | 0 refills | Status: DC | PRN

## 2017-08-26 MED ORDER — DIAZEPAM 5 MG PO TABS: ORAL_TABLET | ORAL | 2 refills | Status: DC

## 2017-08-26 MED ORDER — SULFAMETHOXAZOLE-TRIMETHOPRIM 800-160 MG PO TABS: 1.0000 | ORAL_TABLET | Freq: Two times a day (BID) | ORAL | 0 refills | Status: DC

## 2017-08-26 MED ORDER — DULOXETINE HCL 60 MG PO CPEP: 60.0000 mg | ORAL_CAPSULE | Freq: Every day | ORAL | 3 refills | Status: DC

## 2017-08-26 MED ORDER — FUROSEMIDE 20 MG PO TABS: 20.0000 mg | ORAL_TABLET | Freq: Every day | ORAL | 3 refills | Status: DC | PRN

## 2017-08-26 NOTE — Progress Notes (Signed)
[facility], [place] [address]  Internal MEDICINE  Office Visit Note  Patient Name: Holly Espinoza  [id number]  [id number]  Date of Service: 09/15/2017   Pt is here for routine follow up.   Chief Complaint  Patient presents with  . Urinary Tract Infection    bladder pain.   . Diabetes  . URI    Urinary Tract Infection   This is a recurrent problem. The current episode started in the past 7 days. The problem occurs intermittently. The problem has been gradually worsening. The quality of the pain is described as aching. The pain is at a severity of 5/10. The pain is moderate. There has been no fever. She is not sexually active. There is a history of pyelonephritis. Associated symptoms include chills, flank pain, frequency, hematuria, hesitancy, nausea and urgency. Pertinent negatives include no vomiting. She has tried acetaminophen and home medications for the symptoms. The treatment provided mild relief. Her past medical history is significant for recurrent UTIs.  Diabetes  She presents for her follow-up diabetic visit. She has type 2 diabetes mellitus. No MedicAlert identification noted. Her disease course has been improving. Hypoglycemia symptoms include headaches and nervousness/anxiousness. Pertinent negatives for hypoglycemia include no dizziness. Associated symptoms include fatigue and weakness. Pertinent negatives for diabetes include no chest pain. There are no hypoglycemic complications. Symptoms are improving. Diabetic complications include peripheral neuropathy and PVD. Risk factors for coronary artery disease include diabetes mellitus, dyslipidemia, hypertension, stress, post-menopausal, sedentary lifestyle and tobacco exposure. Current diabetic treatments: bydurean. She is compliant with treatment all of the time. Her weight is stable. She is following a generally healthy diet. Meal planning includes avoidance of concentrated sweets. She has not had a  previous visit with a dietitian. She rarely participates in exercise. There is no change in her home blood glucose trend. An ACE inhibitor/angiotensin II receptor blocker is not being taken. She does not see a podiatrist.Eye exam is current.  URI   This is a recurrent problem. The current episode started in the past 7 days. The problem has been gradually worsening. There has been no fever. Associated symptoms include congestion, coughing, dysuria, ear pain, headaches, nausea, rhinorrhea, sinus pain, sneezing, a sore throat, swollen glands and wheezing. Pertinent negatives include no chest pain, rash or vomiting. She has tried acetaminophen and increased fluids for the symptoms. The treatment provided mild relief.       Current Medication: Outpatient Encounter Medications as of 08/26/2017  Medication Sig  . [DISCONTINUED] morphine (MS CONTIN) 15 MG 12 hr tablet Take 15 mg by mouth every 12 (twelve) hours. Getting through pain mangement  . allopurinol (ZYLOPRIM) 100 MG tablet Take 2 tablets (200 mg total) by mouth at bedtime.  Marland Kitchen aspirin 81 MG chewable tablet Chew 81 mg by mouth daily.  . Colchicine (MITIGARE) 0.6 MG CAPS Take 1.2 mg by mouth See admin instructions. At onset of gout flare. May repeat one additional capsule 1 hour later if symptoms persist.  . Cyanocobalamin (B-12) 1000 MCG/ML KIT Inject 1,000 mcg as directed every 30 (thirty) days.  . diazepam (VALIUM) 5 MG tablet Take 1 tablet po bid prn for muscle pain and tightness.  . DULoxetine (CYMBALTA) 60 MG capsule Take 1 capsule (60 mg total) by mouth daily.  . Exenatide ER (BYDUREON) 2 MG PEN Inject 2 mg into the skin once a week.  . fluconazole (DIFLUCAN) 150 MG tablet Take 1 tablet po once. May repeat dose in 3 days as  needed for persistent symptoms. (Patient not taking: Reported on 09/09/2017)  . furosemide (LASIX) 20 MG tablet Take 1 tablet (20 mg total) by mouth daily as needed for fluid or edema.  Marland Kitchen gemfibrozil (LOPID) 600 MG tablet  Take 1 tablet (600 mg total) by mouth 2 (two) times daily before a meal.  . levothyroxine (SYNTHROID, LEVOTHROID) 75 MCG tablet Take 1 tablet (75 mcg total) by mouth daily before breakfast.  . mirtazapine (REMERON) 15 MG tablet Take 1 tablet (15 mg total) by mouth at bedtime. (Patient not taking: Reported on 09/09/2017)  . omeprazole (PRILOSEC) 40 MG capsule Take 1 capsule (40 mg total) by mouth daily.  . ondansetron (ZOFRAN) 4 MG tablet Take 4 mg by mouth every 8 (eight) hours as needed for nausea. For nausea  . oxyCODONE-acetaminophen (PERCOCET) 5-325 MG tablet Take 1 tablet by mouth every 4 (four) hours as needed for severe pain.  . phenazopyridine (PYRIDIUM) 200 MG tablet Take 1 tablet (200 mg total) by mouth 3 (three) times daily as needed for pain. (Patient not taking: Reported on 09/09/2017)  . pregabalin (LYRICA) 50 MG capsule Take 50 mg by mouth 2 (two) times daily.  Marland Kitchen topiramate (TOPAMAX) 100 MG tablet Take 100 mg by mouth 2 (two) times daily.  . [DISCONTINUED] allopurinol (ZYLOPRIM) 100 MG tablet Take 2 tablets (200 mg total) by mouth at bedtime.  . [DISCONTINUED] ciprofloxacin (CIPRO) 500 MG tablet Take 1 tablet (500 mg total) by mouth 2 (two) times daily. (Patient not taking: Reported on 08/26/2017)  . [DISCONTINUED] diazepam (VALIUM) 5 MG tablet Take 1 tablet po bid prn for muscle pain and tightness.  . [DISCONTINUED] DULoxetine (CYMBALTA) 60 MG capsule Take 1 capsule (60 mg total) by mouth daily.  . [DISCONTINUED] furosemide (LASIX) 20 MG tablet Take 1 tablet (20 mg total) by mouth daily as needed for fluid or edema.  . [DISCONTINUED] oxyCODONE (OXYCONTIN) 10 mg 12 hr tablet Take 10 mg by mouth every 12 (twelve) hours.  . [DISCONTINUED] sulfamethoxazole-trimethoprim (BACTRIM DS,SEPTRA DS) 800-160 MG tablet Take 1 tablet by mouth 2 (two) times daily.   No facility-administered encounter medications on file as of 08/26/2017.     Surgical History: Past Surgical History:  Procedure  Laterality Date  . ABDOMINAL HYSTERECTOMY  2001?  . ANTERIOR AND POSTERIOR REPAIR N/A 11/01/2015   Procedure: CYSTOSCOPY, ANTERIOR (CYSTOCELE) AND POSTERIOR REPAIR (RECTOCELE);  Surgeon: Bjorn Loser, MD;  Location: [facility];  Service: Urology;  Laterality: N/A;  . ANTERIOR CERVICAL DECOMP/DISCECTOMY FUSION  2012  . BACK SURGERY  2001   laminectomy  . BREAST BIOPSY Left 2014  . BREAST LUMPECTOMY Left 02/25/2013  . BREAST SURGERY Left 02/25/13   lumpectomy  . CHOLECYSTECTOMY  1980's  . DILATION AND CURETTAGE OF UTERUS  [id number]   "probably 3" (07/08/2012)  . FRACTURE SURGERY     C4-7 Surgery for fracture  . LUMBAR WOUND DEBRIDEMENT N/A 08/12/2012   Procedure: LUMBAR WOUND DEBRIDEMENT;  Surgeon: Faythe Ghee, MD;  Location: [facility];  Service: Neurosurgery;  Laterality: N/A;  Lumbar Wound Debridement  . POSTERIOR LUMBAR FUSION  07/08/2012  . TONSILLECTOMY  ~ 1968    Medical History: Past Medical History:  Diagnosis Date  . Anxiety   . Arthritis    "pretty much from my neck to my feet" (07/08/2012)  . B12 deficiency anemia   . Chronic lower back pain   . Depression   . Diabetic peripheral neuropathy ([place])   . Gout   . High cholesterol   .  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 Neuromuscular disorder ([place])   . Rheumatoid arthritis ([place])   . Type II diabetes mellitus (HCC)    recent hgb A1C was 5.4 per Dr. Humphrey Rolls, [place], [place]    Family History: Family History  Problem Relation Age of Onset  . Kidney disease Father   . Cancer Other        breast great grandmother  . Breast cancer Other     Social History   Socioeconomic History  . Marital status: Single    Spouse name: Not on file  . Number of children: Not on file  . Years of education: Not on file  . Highest education level: Not on file  Occupational History  . Not on file  Social Needs  . Financial resource strain: Not on file  . Food insecurity:    Worry: Not on file     Inability: Not on file  . Transportation needs:    Medical: Not on file    Non-medical: Not on file  Tobacco Use  . Smoking status: Current Every Day Smoker    Packs/day: 0.50    Years: 40.00    Pack years: 20.00    Types: Cigarettes  . Smokeless tobacco: Never Used  Substance and Sexual Activity  . Alcohol use: No  . Drug use: No    Types: Marijuana, Cocaine    Comment: 07/08/2012 "last drug use >8 yr ago" (07/08/2012)  . Sexual activity: Yes    Comment: quit a long time ago  Lifestyle  . Physical activity:    Days per week: Not on file    Minutes per session: Not on file  . Stress: Not on file  Relationships  . Social connections:    Talks on phone: Not on file    Gets together: Not on file    Attends religious service: Not on file    Active member of club or organization: Not on file    Attends meetings of clubs or organizations: Not on file    Relationship status: Not on file  . Intimate partner violence:    Fear of current or ex partner: Not on file    Emotionally abused: Not on file    Physically abused: Not on file    Forced sexual activity: Not on file  Other Topics Concern  . Not on file  Social History Narrative  . Not on file      Review of Systems  Constitutional: Positive for chills and fatigue. Negative for appetite change and fever.  HENT: Positive for congestion, ear pain, postnasal drip, rhinorrhea, sinus pain, sneezing, sore throat and voice change.   Eyes: Negative.   Respiratory: Positive for cough and wheezing.   Cardiovascular: Negative for chest pain and palpitations.  Gastrointestinal: Positive for nausea. Negative for vomiting.  Endocrine:       Blood sugars doing well   Genitourinary: Positive for dysuria, flank pain, frequency, hematuria, hesitancy and urgency.       Patient to see urology soon due to recurrent bladder prolapse.   Musculoskeletal: Positive for arthralgias, back pain and myalgias.  Skin: Negative for rash.   Allergic/Immunologic: Positive for environmental allergies.  Neurological: Positive for weakness and headaches. Negative for dizziness.  Hematological: Positive for adenopathy.  Psychiatric/Behavioral: Positive for dysphoric mood. The patient is nervous/anxious.    Today's Vitals   08/26/17 1522  BP: 93/64  Pulse: 84  Resp: 16  SpO2: 94%  Weight:  206 lb (93.4 kg)  Height: _0  (1.727 m)    Physical Exam  Constitutional: She is oriented to person, place, and time. She appears well-developed and well-nourished. No distress.  HENT:  Head: Normocephalic and atraumatic.  Right Ear: No tenderness. Tympanic membrane is bulging.  Left Ear: No tenderness. Tympanic membrane is bulging.  Nose: Rhinorrhea present. Right sinus exhibits maxillary sinus tenderness and frontal sinus tenderness. Left sinus exhibits maxillary sinus tenderness and frontal sinus tenderness.  Mouth/Throat: Posterior oropharyngeal erythema present. No oropharyngeal exudate.  Eyes: Pupils are equal, round, and reactive to light. EOM are normal.  Neck: Normal range of motion. Neck supple. No JVD present. No tracheal deviation present. No thyromegaly present.  Cardiovascular: Normal rate, regular rhythm and normal heart sounds. Exam reveals no gallop and no friction rub.  No murmur heard. Pulmonary/Chest: Effort normal and breath sounds normal. No respiratory distress. She has no wheezes. She has no rales. She exhibits no tenderness.  Abdominal: Soft. Bowel sounds are normal. There is no tenderness.  Genitourinary:  Genitourinary Comments: U/a positive for moderate WBC   Musculoskeletal: Normal range of motion.  Lymphadenopathy:    She has cervical adenopathy.  Neurological: She is alert and oriented to person, place, and time. No cranial nerve deficit.  Skin: Skin is warm and dry. She is not diaphoretic.  Psychiatric: Her speech is normal and behavior is normal. Judgment and thought content normal. Her mood appears  anxious. Cognition and memory are normal. She exhibits a depressed mood.  Nursing note and vitals reviewed.  Assessment/Plan: 1. Uncontrolled type 2 diabetes mellitus with hyperglycemia (HCC) - POCT HgB A1C 5.5 today. Continue diabetic medication as prescribed.   2. Urinary tract infection without hematuria, site unspecified Treat with bactrim ds bid for 10 days. Send urine for culture and sensitivity and adjust antibiotics as indicated.  - CULTURE, URINE COMPREHENSIVE  3. Bladder prolapse, female, acquired Keep visit with urology as scheduled.   4. Acute non-recurrent pansinusitis Bactrim DS to treat sinusitis as well as UTI.   5. Edema, unspecified type May take furosemide 102m daily as needed for lower extremity edema.  - furosemide (LASIX) 20 MG tablet; Take 1 tablet (20 mg total) by mouth daily as needed for fluid or edema.  Dispense: 30 tablet; Refill: 3  6. Dysuria - POCT Urinalysis Dipstick positive for moderate WBC. Treat with abx annd adjust as indicated based on culture and sensitivity results.  - phenazopyridine (PYRIDIUM) 200 MG tablet; Take 1 tablet (200 mg total) by mouth 3 (three) times daily as needed for pain. (Patient not taking: Reported on 09/09/2017)  Dispense: 10 tablet; Refill: 0  7. Generalized anxiety disorder with panic attacks Continue duloxettine and diazepam as prescribed. Refills provided today. - DULoxetine (CYMBALTA) 60 MG capsule; Take 1 capsule (60 mg total) by mouth daily.  Dispense: 30 capsule; Refill: 3 - diazepam (VALIUM) 5 MG tablet; Take 1 tablet po bid prn for muscle pain and tightness.  Dispense: 60 tablet; Refill: 2  General Counseling: CTikiaverbalizes understanding of the findings of todays visit and agrees with plan of treatment. I have discussed any further diagnostic evaluation that may be needed or ordered today. We also reviewed her medications today. she has been encouraged to call the office with any questions or concerns that should  arise related to todays visit.    Counseling:  Diabetes Counseling:  1. Addition of ACE inh/ ARB'S for nephroprotection. 2. Diabetic foot care, prevention of complications.  3.Exercise and lose weight.  4. Diabetic eye examination, 5. Monitor blood sugar closlely. nutrition counseling.  6.Sign and symptoms of hypoglycemia including shaking sweating,confusion and headaches.   This patient was seen by Leretha Pol, FNP- C in Collaboration with Dr Lavera Guise as a part of collaborative care agreement  Orders Placed This Encounter  Procedures  . CULTURE, URINE COMPREHENSIVE  . POCT Urinalysis Dipstick  . POCT HgB A1C    Meds ordered this encounter  Medications  . DISCONTD: sulfamethoxazole-trimethoprim (BACTRIM DS,SEPTRA DS) 800-160 MG tablet    Sig: Take 1 tablet by mouth 2 (two) times daily.    Dispense:  20 tablet    Refill:  0    Order Specific Question:   Supervising Provider    Answer:   Lavera Guise [[id number]]  . phenazopyridine (PYRIDIUM) 200 MG tablet    Sig: Take 1 tablet (200 mg total) by mouth 3 (three) times daily as needed for pain.    Dispense:  10 tablet    Refill:  0    Order Specific Question:   Supervising Provider    Answer:   Lavera Guise [[id number]]  . DULoxetine (CYMBALTA) 60 MG capsule    Sig: Take 1 capsule (60 mg total) by mouth daily.    Dispense:  30 capsule    Refill:  3    Order Specific Question:   Supervising Provider    Answer:   Lavera Guise [[id number]]  . furosemide (LASIX) 20 MG tablet    Sig: Take 1 tablet (20 mg total) by mouth daily as needed for fluid or edema.    Dispense:  30 tablet    Refill:  3    Order Specific Question:   Supervising Provider    Answer:   Lavera Guise [[id number]]  . diazepam (VALIUM) 5 MG tablet    Sig: Take 1 tablet po bid prn for muscle pain and tightness.    Dispense:  60 tablet    Refill:  2    Take 1 tab po bid prn for muscle pain and tightness.    Order Specific Question:   Supervising Provider    Answer:    Lavera Guise [[id number]]    Time spent: 41 Minutes       Dr Lavera Guise Internal medicine

## 2017-08-27 LAB — POCT URINALYSIS DIPSTICK
Bilirubin, UA: NEGATIVE
Blood, UA: NEGATIVE
Glucose, UA: NEGATIVE
Ketones, UA: NEGATIVE
Nitrite, UA: NEGATIVE
Protein, UA: NEGATIVE
Spec Grav, UA: 1.01 (ref 1.010–1.025)
Urobilinogen, UA: 0.2 E.U./dL
pH, UA: 6 (ref 5.0–8.0)

## 2017-08-27 LAB — POCT GLYCOSYLATED HEMOGLOBIN (HGB A1C): Hemoglobin A1C: 5.5 % (ref 4.0–5.6)

## 2017-08-29 LAB — CULTURE, URINE COMPREHENSIVE

## 2017-09-02 ENCOUNTER — Other Ambulatory Visit: Payer: Self-pay

## 2017-09-02 MED ORDER — LIRAGLUTIDE 18 MG/3ML ~~LOC~~ SOPN: PEN_INJECTOR | SUBCUTANEOUS | 1 refills | Status: DC

## 2017-09-02 NOTE — Telephone Encounter (Signed)
Pt called that victoza is covered trulicity is not covered as per heather send victoza to [facility] and  Lmom we send pres

## 2017-09-05 NOTE — Progress Notes (Signed)
Approval # for Victoza for one year [phone]

## 2017-09-09 ENCOUNTER — Encounter

## 2017-09-09 ENCOUNTER — Ambulatory Visit: Payer: Medicaid Other | Admitting: Urology

## 2017-09-09 ENCOUNTER — Encounter: Payer: Self-pay | Admitting: Urology

## 2017-09-09 VITALS — BP 109/70 | HR 83 | Ht 68.0 in | Wt 204.0 lb

## 2017-09-09 DIAGNOSIS — N8111 Cystocele, midline: Secondary | ICD-10-CM | POA: Diagnosis not present

## 2017-09-09 MED ORDER — ESTRADIOL 0.1 MG/GM VA CREA: 1.0000 | TOPICAL_CREAM | VAGINAL | 12 refills | Status: DC

## 2017-09-09 NOTE — Progress Notes (Signed)
09/09/2017 1:56 PM   Holly Espinoza [birth year] [id number]  Referring provider: Ronnell Freshwater, NP [address] [place], [place] [address]  Chief Complaint  Patient presents with  . Bladder Prolapse    HPI: I saw the patient a few years ago she was continent but had symptomatic prolapse.  On November 01, 2015 she had a cystocele repair and rectocele repair and cystoscopy.  During the seizure the cuff was quite well supported.  She did not have a vault suspension.  She had minimal stress incontinence on urodynamics but not clinically and had a hyposensitive bladder and other issues mentioned and we did not perform a sling  In the last many months the patient feels vaginal bulging.  Some comfortable.  Sometimes she tries to push it up.  She does not do splinting maneuvers  Sometimes she has to sit for a while before she can urinate.  She has to concentrate.  Her flow actually varies.  At the end she can strain and double void a modest amount.  She does feel empty  She can go 7 or 8 hours and not urinate and always wonders that she does not have a lot of bladder sensation.  She has had neck surgery and lower back surgery.  She fractured her neck.  She takes shots for diabetes.  She voids only a few times a day and gets up to 3 times a night  Recently she was told she had a urinary tract infection twice but had no symptoms.  She just finished Bactrim  Modifying factors: There are no other modifying factors  Associated signs and symptoms: There are no other associated signs and symptoms Aggravating and relieving factors: There are no other aggravating or relieving factors Severity: Moderate Duration: Persistent   PMH: Past Medical History:  Diagnosis Date  . Anxiety   . Arthritis    "pretty much from my neck to my feet" (07/08/2012)  . B12 deficiency anemia   . Chronic lower back pain   . Depression   . Diabetic peripheral neuropathy ([place])   . Gout   . High cholesterol   .  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 Neuromuscular disorder ([place])   . Rheumatoid arthritis ([place])   . Type II diabetes mellitus ([facility])    recent hgb A1C was 5.4 per Dr. Humphrey Rolls, [place], [place]    Surgical History: Past Surgical History:  Procedure Laterality Date  . ABDOMINAL HYSTERECTOMY  2001?  . ANTERIOR AND POSTERIOR REPAIR N/A 11/01/2015   Procedure: CYSTOSCOPY, ANTERIOR (CYSTOCELE) AND POSTERIOR REPAIR (RECTOCELE);  Surgeon: Bjorn Loser, MD;  Location: [facility];  Service: Urology;  Laterality: N/A;  . ANTERIOR CERVICAL DECOMP/DISCECTOMY FUSION  2012  . BACK SURGERY  2001   laminectomy  . BREAST BIOPSY Left 2014  . BREAST LUMPECTOMY Left 02/25/2013  . BREAST SURGERY Left 02/25/13   lumpectomy  . CHOLECYSTECTOMY  1980's  . DILATION AND CURETTAGE OF UTERUS  [phone]   "probably 3" (07/08/2012)  . FRACTURE SURGERY     C4-7 Surgery for fracture  . LUMBAR WOUND DEBRIDEMENT N/A 08/12/2012   Procedure: LUMBAR WOUND DEBRIDEMENT;  Surgeon: Faythe Ghee, MD;  Location: [facility];  Service: Neurosurgery;  Laterality: N/A;  Lumbar Wound Debridement  . POSTERIOR LUMBAR FUSION  07/08/2012  . TONSILLECTOMY  ~ 1968    Home Medications:  Allergies as of 09/09/2017   No Known Allergies     Medication List  Accurate as of 09/09/17  1:56 PM. Always use your most recent med list.          allopurinol 100 MG tablet Commonly known as:  ZYLOPRIM Take 2 tablets (200 mg total) by mouth at bedtime.   aspirin 81 MG chewable tablet Chew 81 mg by mouth daily.   B-12 1000 MCG/ML Kit Inject 1,000 mcg as directed every 30 (thirty) days.   diazepam 5 MG tablet Commonly known as:  VALIUM Take 1 tablet po bid prn for muscle pain and tightness.   DULoxetine 60 MG capsule Commonly known as:  CYMBALTA Take 1 capsule (60 mg total) by mouth daily.   Exenatide ER 2 MG Pen Commonly known as:  BYDUREON Inject 2 mg into the skin once a week.   fluconazole 150  MG tablet Commonly known as:  DIFLUCAN Take 1 tablet po once. May repeat dose in 3 days as needed for persistent symptoms.   furosemide 20 MG tablet Commonly known as:  LASIX Take 1 tablet (20 mg total) by mouth daily as needed for fluid or edema.   gemfibrozil 600 MG tablet Commonly known as:  LOPID Take 1 tablet (600 mg total) by mouth 2 (two) times daily before a meal.   levothyroxine 75 MCG tablet Commonly known as:  SYNTHROID, LEVOTHROID Take 1 tablet (75 mcg total) by mouth daily before breakfast.   liraglutide 18 MG/3ML Sopn Commonly known as:  VICTOZA Inject  1.8 mg [place]  daily   mirtazapine 15 MG tablet Commonly known as:  REMERON Take 1 tablet (15 mg total) by mouth at bedtime.   MITIGARE 0.6 MG Caps Generic drug:  Colchicine Take 1.2 mg by mouth See admin instructions. At onset of gout flare. May repeat one additional capsule 1 hour later if symptoms persist.   omeprazole 40 MG capsule Commonly known as:  PRILOSEC Take 1 capsule (40 mg total) by mouth daily.   ondansetron 4 MG tablet Commonly known as:  ZOFRAN Take 4 mg by mouth every 8 (eight) hours as needed for nausea. For nausea   oxyCODONE-acetaminophen 5-325 MG tablet Commonly known as:  PERCOCET Take 1 tablet by mouth every 4 (four) hours as needed for severe pain.   OXYCONTIN 10 mg 12 hr tablet Generic drug:  oxyCODONE   phenazopyridine 200 MG tablet Commonly known as:  PYRIDIUM Take 1 tablet (200 mg total) by mouth 3 (three) times daily as needed for pain.   pregabalin 50 MG capsule Commonly known as:  LYRICA Take 50 mg by mouth 2 (two) times daily.   topiramate 100 MG tablet Commonly known as:  TOPAMAX Take 100 mg by mouth 2 (two) times daily.       Allergies: No Known Allergies  Family History: Family History  Problem Relation Age of Onset  . Kidney disease Father   . Cancer Other        breast great grandmother  . Breast cancer Other     Social History:  reports that she has  been smoking cigarettes.  She has a 20.00 pack-year smoking history. She has never used smokeless tobacco. She reports that she does not drink alcohol or use drugs.  ROS: UROLOGY Frequent Urination?: Yes Hard to postpone urination?: Yes Burning/pain with urination?: No Get up at night to urinate?: Yes Leakage of urine?: Yes Urine stream starts and stops?: No Trouble starting stream?: No Do you have to strain to urinate?: No Blood in urine?: No Urinary tract infection?: Yes Sexually transmitted disease?:  No Injury to kidneys or bladder?: No Painful intercourse?: Yes Weak stream?: No Currently pregnant?: No Vaginal bleeding?: No Last menstrual period?: n  Gastrointestinal Nausea?: Yes Vomiting?: No Indigestion/heartburn?: No Diarrhea?: No Constipation?: Yes  Constitutional Fever: No Night sweats?: No Weight loss?: No Fatigue?: Yes  Skin Skin rash/lesions?: No Itching?: No  Eyes Blurred vision?: No Double vision?: No  Ears/Nose/Throat Sore throat?: No Sinus problems?: No  Hematologic/Lymphatic Swollen glands?: No Easy bruising?: No  Cardiovascular Leg swelling?: No Chest pain?: No  Respiratory Cough?: No Shortness of breath?: No  Endocrine Excessive thirst?: Yes  Musculoskeletal Back pain?: Yes Joint pain?: Yes  Neurological Headaches?: No Dizziness?: Yes  Psychologic Depression?: Yes Anxiety?: Yes  Physical Exam: BP 109/70 (BP Location: Right Arm, Patient Position: Sitting, Cuff Size: Normal)   Pulse 83   Ht '5\' 8"'$  (1.727 m)   Wt 92.5 kg (204 lb)   BMI 31.02 kg/m   Constitutional:  Alert and oriented, No acute distress. HEENT: [place] AT, moist mucus membranes.  Trachea midline, no masses. Cardiovascular: No clubbing, cyanosis, or edema. Respiratory: Normal respiratory effort, no increased work of breathing. GI: Abdomen is soft, nontender, nondistended, no abdominal masses GU: On pelvic examination the patient actually had quite good  support.  She could cough and bear down quite hard and the vaginal cuff was well supported.  She had absolutely no rectocele.  She had a small grade 2 cystocele with very little central defect.  It would descend minimally but primarily closer to the introitus.  She had no stress incontinence.  She has a large volume vagina but there was really nothing to fix based upon the examination. Skin: No rashes, bruises or suspicious lesions. Lymph: No cervical or inguinal adenopathy. Neurologic: Grossly intact, no focal deficits, moving all 4 extremities. Psychiatric: Normal mood and affect.  Laboratory Data: Lab Results  Component Value Date   WBC 7.7 05/14/2017   HGB 12.0 05/14/2017   HCT 36.3 05/14/2017   MCV 84.8 05/14/2017   PLT 231 05/14/2017    Lab Results  Component Value Date   CREATININE 1.13 (H) 05/14/2017    No results found for: PSA  No results found for: TESTOSTERONE  Lab Results  Component Value Date   HGBA1C 5.5 08/27/2017    Urinalysis    Component Value Date/Time   COLORURINE Colorless 08/21/2012 1318   APPEARANCEUR Clear 07/04/2015 0948   LABSPEC 1.005 08/21/2012 1318   PHURINE 6.0 08/21/2012 1318   GLUCOSEU Negative 07/04/2015 0948   GLUCOSEU Negative 08/21/2012 1318   HGBUR Negative 08/21/2012 1318   BILIRUBINUR NEGATIVE 08/27/2017 1022   BILIRUBINUR Negative 07/04/2015 0948   BILIRUBINUR Negative 08/21/2012 1318   KETONESUR Negative 08/21/2012 1318   PROTEINUR Negative 08/27/2017 1022   PROTEINUR Negative 07/04/2015 0948   PROTEINUR Negative 08/21/2012 1318   UROBILINOGEN 0.2 08/27/2017 1022   NITRITE NEGATIVE 08/27/2017 1022   NITRITE Negative 07/04/2015 0948   NITRITE Negative 08/21/2012 1318   LEUKOCYTESUR Moderate (2+) (A) 08/27/2017 1022   LEUKOCYTESUR Negative 07/04/2015 0948   LEUKOCYTESUR Negative 08/21/2012 1318    Pertinent Imaging:   Assessment & Plan: I felt before the patient had risk factors for neurogenic bladder.  She had a large  capacity bladder.  She is currently continent.  She is describing recurrent prolapse.  I felt clinically that her recurrence rate may have been higher in the past because in combination with a moderate central defect she also had a trapdoor deformity.  Picture was drawn.  Perhaps  in the standing position the bladder drops more but she really could strain while in the supine position.  Certainly there is not enough of a central defect for any sort of imbrication.  I cannot imagine chest to find a robotic sacral colpopexy with the findings.  She should not have a hinge effect for voiding.  Her straining and flow symptoms again may be due to to her large capacity and possibly neurogenic bladder.  In my opinion she likely was not having true clinical cystitis.  Importantly I found a urine culture from April and June and they were both normal   She understands that the uncomfortable feeling with sitting may be from vaginal dryness.  Role of local estrogen cream discussed and given See in 6 weeks    There are no diagnoses linked to this encounter.  No follow-ups on file.  Reece Packer, MD  [facility] [address], [place], [place] [address] [phone]

## 2017-09-15 DIAGNOSIS — R609 Edema, unspecified: Secondary | ICD-10-CM | POA: Insufficient documentation

## 2017-09-15 DIAGNOSIS — E1165 Type 2 diabetes mellitus with hyperglycemia: Secondary | ICD-10-CM | POA: Insufficient documentation

## 2017-09-17 ENCOUNTER — Telehealth: Payer: Self-pay

## 2017-09-17 ENCOUNTER — Other Ambulatory Visit: Payer: Self-pay | Admitting: Nurse Practitioner

## 2017-09-17 DIAGNOSIS — E1165 Type 2 diabetes mellitus with hyperglycemia: Secondary | ICD-10-CM

## 2017-09-17 MED ORDER — "PEN NEEDLES 5/16"" 30G X 8 MM MISC": 5 refills | Status: DC

## 2017-09-17 NOTE — Telephone Encounter (Signed)
Added pen needles for victoza and sent to pharmacy.

## 2017-09-17 NOTE — Progress Notes (Signed)
Added pen needles for victoza and sent to pharmacy.

## 2017-09-18 NOTE — Telephone Encounter (Signed)
Let voicemail informing pt of prescription for pen needles sent to pharmacy

## 2017-10-23 ENCOUNTER — Other Ambulatory Visit: Payer: Self-pay

## 2017-10-23 ENCOUNTER — Telehealth: Payer: Self-pay

## 2017-10-23 MED ORDER — B-12 1000 MCG/ML IJ KIT: 1000.0000 ug | PACK | INTRAMUSCULAR | 3 refills | Status: DC

## 2017-10-23 NOTE — Telephone Encounter (Signed)
Called pt to confirm all medications needing refill left message on voicemail to call me back.

## 2017-10-24 ENCOUNTER — Other Ambulatory Visit: Payer: Self-pay

## 2017-10-24 DIAGNOSIS — E039 Hypothyroidism, unspecified: Secondary | ICD-10-CM

## 2017-10-24 MED ORDER — LIRAGLUTIDE 18 MG/3ML ~~LOC~~ SOPN: PEN_INJECTOR | SUBCUTANEOUS | 1 refills | Status: DC

## 2017-10-24 MED ORDER — LEVOTHYROXINE SODIUM 75 MCG PO TABS: 75.0000 ug | ORAL_TABLET | Freq: Every day | ORAL | 3 refills | Status: DC

## 2017-11-21 ENCOUNTER — Other Ambulatory Visit: Payer: Self-pay

## 2017-11-21 MED ORDER — ALLOPURINOL 100 MG PO TABS: 200.0000 mg | ORAL_TABLET | Freq: Every day | ORAL | 3 refills | Status: DC

## 2017-11-29 ENCOUNTER — Ambulatory Visit: Payer: Medicaid Other | Admitting: Nurse Practitioner

## 2017-11-29 ENCOUNTER — Encounter: Payer: Self-pay | Admitting: Nurse Practitioner

## 2017-11-29 VITALS — BP 117/69 | HR 68 | Resp 16 | Ht 68.0 in | Wt 193.2 lb

## 2017-11-29 DIAGNOSIS — F410 Panic disorder [episodic paroxysmal anxiety] without agoraphobia: Secondary | ICD-10-CM

## 2017-11-29 DIAGNOSIS — N760 Acute vaginitis: Secondary | ICD-10-CM

## 2017-11-29 DIAGNOSIS — E1165 Type 2 diabetes mellitus with hyperglycemia: Secondary | ICD-10-CM

## 2017-11-29 DIAGNOSIS — Z0001 Encounter for general adult medical examination with abnormal findings: Secondary | ICD-10-CM

## 2017-11-29 DIAGNOSIS — R300 Dysuria: Secondary | ICD-10-CM | POA: Diagnosis not present

## 2017-11-29 DIAGNOSIS — Z124 Encounter for screening for malignant neoplasm of cervix: Secondary | ICD-10-CM | POA: Diagnosis not present

## 2017-11-29 DIAGNOSIS — B9689 Other specified bacterial agents as the cause of diseases classified elsewhere: Secondary | ICD-10-CM

## 2017-11-29 DIAGNOSIS — E785 Hyperlipidemia, unspecified: Secondary | ICD-10-CM

## 2017-11-29 DIAGNOSIS — Z23 Encounter for immunization: Secondary | ICD-10-CM

## 2017-11-29 DIAGNOSIS — F411 Generalized anxiety disorder: Secondary | ICD-10-CM

## 2017-11-29 LAB — POCT GLYCOSYLATED HEMOGLOBIN (HGB A1C): Hemoglobin A1C: 5.5 % (ref 4.0–5.6)

## 2017-11-29 MED ORDER — METRONIDAZOLE 500 MG PO TABS: 500.0000 mg | ORAL_TABLET | Freq: Two times a day (BID) | ORAL | 0 refills | Status: DC

## 2017-11-29 MED ORDER — LIRAGLUTIDE 18 MG/3ML ~~LOC~~ SOPN: PEN_INJECTOR | SUBCUTANEOUS | 3 refills | Status: DC

## 2017-11-29 MED ORDER — DIAZEPAM 5 MG PO TABS: ORAL_TABLET | ORAL | 2 refills | Status: DC

## 2017-11-29 MED ORDER — GEMFIBROZIL 600 MG PO TABS: 600.0000 mg | ORAL_TABLET | Freq: Two times a day (BID) | ORAL | 3 refills | Status: DC

## 2017-11-29 NOTE — Progress Notes (Signed)
[facility] [place], [place] [address]  Internal MEDICINE  Office Visit Note  Patient Name: Holly Espinoza  [id number]  [id number]  Date of Service: 12/02/2017  Chief Complaint  Patient presents with  . Medicare Wellness    3 month well visit  . Urinary Tract Infection    pt thinks she may have an infection in the vaginal area, she has discharge and lower back pain , has been going on for a few weeks.   . Gynecologic Exam    pt wants to have pap due to vaginal discharge      Patient arrives for her annual wellness visit. She was seen by urologist in July, 2019 for her prolapse bladder. Patient was prescribed estrogen cream for vaginal dryness to reduce vaginal discomfort, but she had some trouble to refill the prescription. Patient stays she still has prolapsed bladder but continent. Patient also report some malodorous whitish greenish vaginal discharge that started two weeks ago. Patient denies any sexual activity in the past 1.5 years, change in her hygiene habits. Patient mentions that she went to the swimming pool about two weeks ago and vaginal discharge appeared after it. Otherwise, patient feels well today with usual occasional joint pains. She currently denies any acute pain and discomfort.  Pt is here for routine health maintenance examination  Current Medication: Outpatient Encounter Medications as of 11/29/2017  Medication Sig  . allopurinol (ZYLOPRIM) 100 MG tablet Take 2 tablets (200 mg total) by mouth at bedtime.  Marland Kitchen aspirin 81 MG chewable tablet Chew 81 mg by mouth daily.  . Colchicine (MITIGARE) 0.6 MG CAPS Take 1.2 mg by mouth See admin instructions. At onset of gout flare. May repeat one additional capsule 1 hour later if symptoms persist.  . Cyanocobalamin (B-12) 1000 MCG/ML KIT Inject 1,000 mcg as directed every 30 (thirty) days.  . diazepam (VALIUM) 5 MG tablet Take 1 tablet po bid prn for muscle pain and tightness.  . DULoxetine  (CYMBALTA) 60 MG capsule Take 1 capsule (60 mg total) by mouth daily.  Marland Kitchen estradiol (ESTRACE) 0.1 MG/GM vaginal cream Place 1 Applicatorful vaginally 3 (three) times a week. 3 times a week for 3weeks and then once weekly  . Exenatide ER (BYDUREON) 2 MG PEN Inject 2 mg into the skin once a week.  . furosemide (LASIX) 20 MG tablet Take 1 tablet (20 mg total) by mouth daily as needed for fluid or edema.  Marland Kitchen gemfibrozil (LOPID) 600 MG tablet Take 1 tablet (600 mg total) by mouth 2 (two) times daily before a meal.  . Insulin Pen Needle (PEN NEEDLES 5/16") 30G X 8 MM MISC To use every day with victoza injections - E11.65  . levothyroxine (SYNTHROID, LEVOTHROID) 75 MCG tablet Take 1 tablet (75 mcg total) by mouth daily before breakfast.  . liraglutide (VICTOZA) 18 MG/3ML SOPN Inject  1.8 mg [place]  daily  . omeprazole (PRILOSEC) 40 MG capsule Take 1 capsule (40 mg total) by mouth daily.  . ondansetron (ZOFRAN) 4 MG tablet Take 4 mg by mouth every 8 (eight) hours as needed for nausea. For nausea  . oxyCODONE-acetaminophen (PERCOCET) 5-325 MG tablet Take 1 tablet by mouth every 4 (four) hours as needed for severe pain.  . OXYCONTIN 10 MG 12 hr tablet   . pregabalin (LYRICA) 50 MG capsule Take 50 mg by mouth 2 (two) times daily.  Marland Kitchen topiramate (TOPAMAX) 100 MG tablet Take 100 mg by mouth 2 (two) times daily.  . [  DISCONTINUED] diazepam (VALIUM) 5 MG tablet Take 1 tablet po bid prn for muscle pain and tightness.  . [DISCONTINUED] gemfibrozil (LOPID) 600 MG tablet Take 1 tablet (600 mg total) by mouth 2 (two) times daily before a meal.  . [DISCONTINUED] liraglutide (VICTOZA) 18 MG/3ML SOPN Inject  1.8 mg [place]  daily  . fluconazole (DIFLUCAN) 150 MG tablet Take 1 tablet po once. May repeat dose in 3 days as needed for persistent symptoms. (Patient not taking: Reported on 09/09/2017)  . metroNIDAZOLE (FLAGYL) 500 MG tablet Take 1 tablet (500 mg total) by mouth 2 (two) times daily.  . mirtazapine (REMERON) 15 MG tablet Take  1 tablet (15 mg total) by mouth at bedtime. (Patient not taking: Reported on 09/09/2017)  . phenazopyridine (PYRIDIUM) 200 MG tablet Take 1 tablet (200 mg total) by mouth 3 (three) times daily as needed for pain. (Patient not taking: Reported on 09/09/2017)   No facility-administered encounter medications on file as of 11/29/2017.     Surgical History: Past Surgical History:  Procedure Laterality Date  . ABDOMINAL HYSTERECTOMY  2001?  . ANTERIOR AND POSTERIOR REPAIR N/A 11/01/2015   Procedure: CYSTOSCOPY, ANTERIOR (CYSTOCELE) AND POSTERIOR REPAIR (RECTOCELE);  Surgeon: Bjorn Loser, MD;  Location: [facility];  Service: Urology;  Laterality: N/A;  . ANTERIOR CERVICAL DECOMP/DISCECTOMY FUSION  2012  . BACK SURGERY  2001   laminectomy  . BREAST BIOPSY Left 2014  . BREAST LUMPECTOMY Left 02/25/2013  . BREAST SURGERY Left 02/25/13   lumpectomy  . CHOLECYSTECTOMY  1980's  . DILATION AND CURETTAGE OF UTERUS  [phone]   "probably 3" (07/08/2012)  . FRACTURE SURGERY     C4-7 Surgery for fracture  . LUMBAR WOUND DEBRIDEMENT N/A 08/12/2012   Procedure: LUMBAR WOUND DEBRIDEMENT;  Surgeon: Faythe Ghee, MD;  Location: [facility];  Service: Neurosurgery;  Laterality: N/A;  Lumbar Wound Debridement  . POSTERIOR LUMBAR FUSION  07/08/2012  . TONSILLECTOMY  ~ 1968    Medical History: Past Medical History:  Diagnosis Date  . Anxiety   . Arthritis    "pretty much from my neck to my feet" (07/08/2012)  . B12 deficiency anemia   . Chronic lower back pain   . Depression   . Diabetic peripheral neuropathy ([place])   . Gout   . High cholesterol   . 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 Neuromuscular disorder ([place])   . Rheumatoid arthritis ([place])   . Type II diabetes mellitus (HCC)    recent hgb A1C was 5.4 per Dr. Humphrey Rolls, [place], [place]    Family History: Family History  Problem Relation Age of Onset  . Kidney disease Father   . Cancer Other        breast great  grandmother  . Breast cancer Other       Review of Systems  Constitutional: Negative for activity change, chills, fatigue, fever and unexpected weight change.  HENT: Negative for congestion, ear pain, facial swelling, postnasal drip, rhinorrhea, sinus pressure, sneezing and sore throat.   Eyes: Negative.  Negative for discharge and redness.  Respiratory: Negative for cough, chest tightness, shortness of breath and wheezing.   Cardiovascular: Negative for chest pain and palpitations.  Gastrointestinal: Negative for abdominal pain, blood in stool, constipation, diarrhea, nausea and vomiting.  Endocrine: Negative for cold intolerance, heat intolerance, polydipsia, polyphagia and polyuria.       Blood sugars doing well   Genitourinary: Positive for frequency and vaginal discharge. Negative for dysuria, genital  sores, hematuria, pelvic pain, urgency and vaginal pain.       Moderate to large amount of malodorous whitish green vaginal discharge Prolapse bladder  Musculoskeletal: Positive for back pain. Negative for arthralgias, joint swelling and neck pain.       Chronic back and neck pain and joint pain  Skin: Negative for rash.  Neurological: Negative for dizziness, tremors, numbness and headaches.  Hematological: Negative for adenopathy. Does not bruise/bleed easily.  Psychiatric/Behavioral: Positive for dysphoric mood and sleep disturbance. Negative for behavioral problems (Depression) and suicidal ideas. The patient is nervous/anxious.      Today's Vitals   11/29/17 1124  BP: 117/69  Pulse: 68  Resp: 16  SpO2: 98%  Weight: 193 lb 3.2 oz (87.6 kg)  Height: 5' 8"  (1.727 m)   Physical Exam  Constitutional: She is oriented to person, place, and time. She appears well-developed and well-nourished.  HENT:  Head: Normocephalic.  Right Ear: External ear normal.  Left Ear: External ear normal.  Nose: Nose normal.  Mouth/Throat: Oropharynx is clear and moist.  Eyes: Pupils are equal,  round, and reactive to light. Conjunctivae and EOM are normal.  Neck: Neck supple. No JVD present. Decreased range of motion present. No tracheal deviation present. No thyromegaly present.  Chronic neck pain  Cardiovascular: Normal rate, regular rhythm, normal heart sounds and intact distal pulses.  Pulses:      Dorsalis pedis pulses are 2+ on the right side, and 2+ on the left side.  Pulmonary/Chest: Breath sounds normal. No respiratory distress. She has no wheezes. Right breast exhibits no inverted nipple, no mass, no nipple discharge, no skin change and no tenderness. Left breast exhibits no inverted nipple, no nipple discharge and no skin change.  Mild tenderness of left lateral breast without evidence of discoloration. No masses palpated.  Abdominal: Soft. Bowel sounds are normal. There is no tenderness.  Genitourinary: Vagina normal and uterus normal.  Genitourinary Comments: Mild bladder prolapse. No tenderness, masses, or organomeglay present during bimanual exam .  Musculoskeletal:       Right foot: There is deformity.       Feet:  Limited ROM in upper and lower extremities due to chronic joint pain.  Feet:  Right Foot:  Protective Sensation: 7 sites tested. 7 sites sensed.  Left Foot:  Protective Sensation: 7 sites tested. 7 sites sensed.  Lymphadenopathy:    She has no cervical adenopathy.  Neurological: She is alert and oriented to person, place, and time.  Skin: Skin is warm and dry. Capillary refill takes less than 2 seconds.  Psychiatric: Her speech is normal and behavior is normal. Judgment and thought content normal. Her mood appears anxious. Cognition and memory are normal. She exhibits a depressed mood.  Nursing note and vitals reviewed.    LABS: Recent Results (from the past 2160 hour(s))  POCT HgB A1C     Status: None   Collection Time: 11/29/17 11:50 AM  Result Value Ref Range   Hemoglobin A1C 5.5 4.0 - 5.6 %   HbA1c POC (<> result, manual entry)     HbA1c,  POC (prediabetic range)     HbA1c, POC (controlled diabetic range)     Assessment/Plan:  1. Encounter for general adult medical examination with abnormal findings Annual health maintenance exam with pap smear today.   2. Uncontrolled type 2 diabetes mellitus with hyperglycemia (HCC) - POCT HgB A1C 5.5. Continue victoza as prescribed - liraglutide (VICTOZA) 18 MG/3ML SOPN; Inject  1.8 mg [place]  daily  Dispense: 5 pen; Refill: 3  3. Hyperlipidemia, unspecified hyperlipidemia type Continue gemfibrizil bid  - gemfibrozil (LOPID) 600 MG tablet; Take 1 tablet (600 mg total) by mouth 2 (two) times daily before a meal.  Dispense: 60 tablet; Refill: 3  4. Bacterial vaginitis - metroNIDAZOLE (FLAGYL) 500 MG tablet; Take 1 tablet (500 mg total) by mouth 2 (two) times daily.  Dispense: 14 tablet; Refill: 0  5. Screening for malignant neoplasm of cervix - Pap IG and HPV (high risk) DNA detection  6. Generalized anxiety disorder with panic attack - diazepam (VALIUM) 5 MG tablet; Take 1 tablet po bid prn for muscle pain and tightness.  Dispense: 60 tablet; Refill: 2  7. Needs flu shot - Flu Vaccine MDCK QUAD PF  8. Dysuria   General Counseling: annalia metzger understanding of the findings of todays visit and agrees with plan of treatment. I have discussed any further diagnostic evaluation that may be needed or ordered today. We also reviewed her medications today. she has been encouraged to call the office with any questions or concerns that should arise related to todays visit.    Counseling:  Diabetes Counseling:  1. Addition of ACE inh/ ARB'S for nephroprotection. Microalbumin is updated  2. Diabetic foot care, prevention of complications. Podiatry consult 3. Exercise and lose weight.  4. Diabetic eye examination, Diabetic eye exam is updated  5. Monitor blood sugar closlely. nutrition counseling.  6. Sign and symptoms of hypoglycemia including shaking sweating,confusion and  headaches.   This patient was seen by Leretha Pol FNP Collaboration with Dr Lavera Guise as a part of collaborative care agreement  Orders Placed This Encounter  Procedures  . Flu Vaccine MDCK QUAD PF  . POCT HgB A1C    Meds ordered this encounter  Medications  . metroNIDAZOLE (FLAGYL) 500 MG tablet    Sig: Take 1 tablet (500 mg total) by mouth 2 (two) times daily.    Dispense:  14 tablet    Refill:  0    Order Specific Question:   Supervising Provider    Answer:   Lavera Guise [[id number]]  . liraglutide (VICTOZA) 18 MG/3ML SOPN    Sig: Inject  1.8 mg [place]  daily    Dispense:  5 pen    Refill:  3    Order Specific Question:   Supervising Provider    Answer:   Lavera Guise [place]  . diazepam (VALIUM) 5 MG tablet    Sig: Take 1 tablet po bid prn for muscle pain and tightness.    Dispense:  60 tablet    Refill:  2    Take 1 tab po bid prn for muscle pain and tightness.    Order Specific Question:   Supervising Provider    Answer:   Lavera Guise [[id number]]  . gemfibrozil (LOPID) 600 MG tablet    Sig: Take 1 tablet (600 mg total) by mouth 2 (two) times daily before a meal.    Dispense:  60 tablet    Refill:  3    Order Specific Question:   Supervising Provider    Answer:   Lavera Guise [[id number]]    Time spent: [place], MD  Internal Medicine

## 2017-12-02 DIAGNOSIS — Z0001 Encounter for general adult medical examination with abnormal findings: Secondary | ICD-10-CM | POA: Insufficient documentation

## 2017-12-02 DIAGNOSIS — B9689 Other specified bacterial agents as the cause of diseases classified elsewhere: Secondary | ICD-10-CM | POA: Insufficient documentation

## 2017-12-02 DIAGNOSIS — Z124 Encounter for screening for malignant neoplasm of cervix: Secondary | ICD-10-CM | POA: Insufficient documentation

## 2017-12-02 DIAGNOSIS — N760 Acute vaginitis: Secondary | ICD-10-CM

## 2017-12-05 ENCOUNTER — Telehealth: Payer: Self-pay

## 2017-12-05 LAB — PAP IG AND HPV HIGH-RISK
HPV, high-risk: NEGATIVE
PAP Smear Comment: 0

## 2017-12-05 NOTE — Telephone Encounter (Signed)
Informed pt of her pap results pt also stated that she had an infection going on at her last visit and was put on an antibiotic (flagly) she stated that she is taking the last dose today and it had not cleared up completely of the discharge. I advised pt that if she doesn't feel any better in a few days to give [place] a call back and we can go from there

## 2017-12-05 NOTE — Telephone Encounter (Signed)
-----   Message from Ronnell [place], NP sent at 12/05/2017  1:12 PM EDT ----- Please let the patient know that her pap smear was normal. Thanks

## 2018-01-07 ENCOUNTER — Telehealth: Payer: Self-pay

## 2018-01-07 NOTE — Telephone Encounter (Signed)
Spoke with pt need appt for ongoing issues wirh vaginal discharge

## 2018-01-10 ENCOUNTER — Ambulatory Visit: Payer: Self-pay | Admitting: Adult Health

## 2018-01-23 ENCOUNTER — Other Ambulatory Visit: Payer: Self-pay

## 2018-01-23 DIAGNOSIS — F411 Generalized anxiety disorder: Principal | ICD-10-CM

## 2018-01-23 DIAGNOSIS — F410 Panic disorder [episodic paroxysmal anxiety] without agoraphobia: Secondary | ICD-10-CM

## 2018-01-23 MED ORDER — DULOXETINE HCL 60 MG PO CPEP: 60.0000 mg | ORAL_CAPSULE | Freq: Every day | ORAL | 3 refills | Status: DC

## 2018-02-18 ENCOUNTER — Other Ambulatory Visit: Payer: Self-pay | Admitting: Nurse Practitioner

## 2018-02-18 DIAGNOSIS — E039 Hypothyroidism, unspecified: Secondary | ICD-10-CM

## 2018-02-18 MED ORDER — LEVOTHYROXINE SODIUM 75 MCG PO TABS: 75.0000 ug | ORAL_TABLET | Freq: Every day | ORAL | 3 refills | Status: DC

## 2018-02-26 ENCOUNTER — Encounter: Payer: Self-pay | Admitting: Nurse Practitioner

## 2018-02-26 ENCOUNTER — Ambulatory Visit: Payer: Medicaid Other | Admitting: Nurse Practitioner

## 2018-02-26 VITALS — BP 126/76 | HR 80 | Resp 16 | Ht 68.0 in | Wt 192.6 lb

## 2018-02-26 DIAGNOSIS — B373 Candidiasis of vulva and vagina: Secondary | ICD-10-CM

## 2018-02-26 DIAGNOSIS — M7020 Olecranon bursitis, unspecified elbow: Secondary | ICD-10-CM | POA: Insufficient documentation

## 2018-02-26 DIAGNOSIS — H66001 Acute suppurative otitis media without spontaneous rupture of ear drum, right ear: Secondary | ICD-10-CM | POA: Diagnosis not present

## 2018-02-26 DIAGNOSIS — S52123A Displaced fracture of head of unspecified radius, initial encounter for closed fracture: Secondary | ICD-10-CM | POA: Insufficient documentation

## 2018-02-26 DIAGNOSIS — B3731 Acute candidiasis of vulva and vagina: Secondary | ICD-10-CM

## 2018-02-26 MED ORDER — FLUCONAZOLE 150 MG PO TABS: ORAL_TABLET | ORAL | 0 refills | Status: DC

## 2018-02-26 MED ORDER — CIPROFLOXACIN-DEXAMETHASONE 0.3-0.1 % OT SUSP: 4.0000 [drp] | Freq: Two times a day (BID) | OTIC | 0 refills | Status: DC

## 2018-02-26 MED ORDER — NYSTATIN 100000 UNIT/GM EX OINT: 1.0000 "application " | TOPICAL_OINTMENT | Freq: Three times a day (TID) | CUTANEOUS | 1 refills | Status: DC

## 2018-02-26 NOTE — Progress Notes (Signed)
[facility] [place], [place] [address]  Internal MEDICINE  Office Visit Note  Patient Name: Holly Espinoza  [id number]  [id number]  Date of Service: 02/26/2018   Pt is here for a sick visit.  Chief Complaint  Patient presents with  . Ear Problem    pt is having ear aches in right ear. bright red blood and pain in right ear, also affecting her hearing  . Medical Management of Chronic Issues    pt having problems with bladder, had flagyl in september and wanting to know if she can get a rx, hurt on the outside of the virgina, stated that it does not itch, or burn, some discharge,      The patient is here for sick visit. States that she is having pain in right ear which has been going on for past 4 days. The past 2 nights, she has noted bright red blood coming from the right ear. States that she woke up and noted a puddle of blood on her pillow and had been laying on her right side . Does have some nasal congestion. Denies fever. She denies any trauma to the ear drum or foreign body presence. States that if she closes her left ear, will wound like she is in a tunnel.  States that she also has noted some vaginal pain, around the labia. The skin is burning, especially after using the bathroom. She was treated with oral flagyl twice daily for 7 days. The burning sensation started after she completed treatment with antibiotics. No discharge or pain inside the vagina.        Current Medication:  Outpatient Encounter Medications as of 02/26/2018  Medication Sig  . allopurinol (ZYLOPRIM) 100 MG tablet Take 2 tablets (200 mg total) by mouth at bedtime.  Marland Kitchen aspirin 81 MG chewable tablet Chew 81 mg by mouth daily.  Marland Kitchen [place] 450 MCG FILM 450 mcg.  . Colchicine (MITIGARE) 0.6 MG CAPS Take 1.2 mg by mouth See admin instructions. At onset of gout flare. May repeat one additional capsule 1 hour later if symptoms persist.  . Cyanocobalamin (B-12) 1000 MCG/ML KIT Inject  1,000 mcg as directed every 30 (thirty) days.  . diazepam (VALIUM) 5 MG tablet Take 1 tablet po bid prn for muscle pain and tightness.  . DULoxetine (CYMBALTA) 60 MG capsule Take 1 capsule (60 mg total) by mouth daily.  Marland Kitchen estradiol (ESTRACE) 0.1 MG/GM vaginal cream Place 1 Applicatorful vaginally 3 (three) times a week. 3 times a week for 3weeks and then once weekly  . Exenatide ER (BYDUREON) 2 MG PEN Inject 2 mg into the skin once a week.  . furosemide (LASIX) 20 MG tablet Take 1 tablet (20 mg total) by mouth daily as needed for fluid or edema.  Marland Kitchen gemfibrozil (LOPID) 600 MG tablet Take 1 tablet (600 mg total) by mouth 2 (two) times daily before a meal.  . Insulin Pen Needle (PEN NEEDLES 5/16") 30G X 8 MM MISC To use every day with victoza injections - E11.65  . levothyroxine (SYNTHROID, LEVOTHROID) 75 MCG tablet Take 1 tablet (75 mcg total) by mouth daily before breakfast.  . liraglutide (VICTOZA) 18 MG/3ML SOPN Inject  1.8 mg [place]  daily  . metroNIDAZOLE (FLAGYL) 500 MG tablet Take 1 tablet (500 mg total) by mouth 2 (two) times daily.  Marland Kitchen omeprazole (PRILOSEC) 40 MG capsule Take 1 capsule (40 mg total) by mouth daily.  . ondansetron (ZOFRAN) 4 MG tablet Take 4  mg by mouth every 8 (eight) hours as needed for nausea. For nausea  . oxyCODONE-acetaminophen (PERCOCET) 5-325 MG tablet Take 1 tablet by mouth every 4 (four) hours as needed for severe pain.  . OXYCONTIN 10 MG 12 hr tablet   . pregabalin (LYRICA) 50 MG capsule Take 50 mg by mouth 2 (two) times daily.  Marland Kitchen topiramate (TOPAMAX) 100 MG tablet Take 100 mg by mouth 2 (two) times daily.  . ciprofloxacin-dexamethasone (CIPRODEX) OTIC suspension Place 4 drops into the right ear 2 (two) times daily.  . fluconazole (DIFLUCAN) 150 MG tablet Take 1 tablet po once. May repeat dose in 3 days as needed for persistent symptoms.  . mirtazapine (REMERON) 15 MG tablet Take 1 tablet (15 mg total) by mouth at bedtime. (Patient not taking: Reported on 09/09/2017)  .  nystatin ointment (MYCOSTATIN) Apply 1 application topically 3 (three) times daily.  . phenazopyridine (PYRIDIUM) 200 MG tablet Take 1 tablet (200 mg total) by mouth 3 (three) times daily as needed for pain. (Patient not taking: Reported on 09/09/2017)  . [DISCONTINUED] fluconazole (DIFLUCAN) 150 MG tablet Take 1 tablet po once. May repeat dose in 3 days as needed for persistent symptoms. (Patient not taking: Reported on 09/09/2017)   No facility-administered encounter medications on file as of 02/26/2018.       Medical History: Past Medical History:  Diagnosis Date  . Anxiety   . Arthritis    "pretty much from my neck to my feet" (07/08/2012)  . B12 deficiency anemia   . Chronic lower back pain   . Depression   . Diabetic peripheral neuropathy ([place])   . Gout   . High cholesterol   . 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 Neuromuscular disorder ([place])   . Rheumatoid arthritis ([place])   . Type II diabetes mellitus (HCC)    recent hgb A1C was 5.4 per Dr. Humphrey Rolls, [place], [place]   Review of Systems  Constitutional: Negative for fever and malaise/fatigue.  HENT: Positive for ear discharge and ear pain. Negative for congestion.        Bleeding from the right ear past two nights. Pain in right ear for past four days.  Respiratory: Negative for cough, shortness of breath and wheezing.   Cardiovascular: Negative for chest pain, palpitations and leg swelling.  Gastrointestinal: Negative for constipation, diarrhea, nausea and vomiting.  Genitourinary: Positive for dysuria.       Pain around the labia and urethra, especially after urination.  Musculoskeletal: Positive for myalgias and neck pain.  Skin: Negative for itching and rash.  Neurological: Positive for headaches. Negative for tremors and weakness.  Endo/Heme/Allergies: Positive for environmental allergies.  Psychiatric/Behavioral: Positive for depression. The patient is nervous/anxious.     Today's Vitals    02/26/18 1443  BP: 126/76  Pulse: 80  Resp: 16  SpO2: 97%  Weight: 192 lb 9.6 oz (87.4 kg)  Height: 5' 8"  (1.727 m)      Physical Exam Vitals signs and nursing note reviewed.  Constitutional:      General: She is not in acute distress.    Appearance: Normal appearance. She is well-developed. She is not diaphoretic.  HENT:     Head: Normocephalic and atraumatic.     Right Ear: Decreased hearing noted. Swelling and tenderness present. Tympanic membrane is erythematous.     Left Ear: External ear normal. Tympanic membrane is bulging.     Mouth/Throat:     Pharynx: No oropharyngeal exudate.  Eyes:  Pupils: Pupils are equal, round, and reactive to light.  Neck:     Musculoskeletal: Normal range of motion and neck supple. Muscular tenderness present.     Thyroid: No thyromegaly.     Vascular: No JVD.     Trachea: No tracheal deviation.  Cardiovascular:     Rate and Rhythm: Normal rate and regular rhythm.     Heart sounds: Normal heart sounds. No murmur. No friction rub. No gallop.   Pulmonary:     Effort: Pulmonary effort is normal. No respiratory distress.     Breath sounds: Normal breath sounds. No wheezing or rales.  Chest:     Chest wall: No tenderness.  Abdominal:     General: Bowel sounds are normal.     Palpations: Abdomen is soft.  Musculoskeletal: Normal range of motion.  Lymphadenopathy:     Cervical: No cervical adenopathy.  Skin:    General: Skin is warm and dry.  Neurological:     Mental Status: She is alert and oriented to person, place, and time. Mental status is at baseline.     Cranial Nerves: No cranial nerve deficit.  Psychiatric:        Attention and Perception: Attention and perception normal.        Mood and Affect: Mood is anxious.        Speech: Speech normal.        Behavior: Behavior normal.        Thought Content: Thought content normal.        Cognition and Memory: Cognition and memory normal.        Judgment: Judgment normal.    Assessment/Plan:  1. Non-recurrent acute suppurative otitis media of right ear without spontaneous rupture of tympanic membrane ciprodex ear drops - start four drops twice daily for next 7 days into the right ear. If no better in few days, will refer to ENT.  - ciprofloxacin-dexamethasone (CIPRODEX) OTIC suspension; Place 4 drops into the right ear 2 (two) times daily.  Dispense: 7.5 mL; Refill: 0  2. Vaginal candidiasis Start diflucan 167m tablets. Take once. Repeat in three days for persistent symptoms. Nystatin ointment may be applied externally two to three times daily for next 7 days.  - nystatin ointment (MYCOSTATIN); Apply 1 application topically 3 (three) times daily.  Dispense: 30 g; Refill: 1 - fluconazole (DIFLUCAN) 150 MG tablet; Take 1 tablet po once. May repeat dose in 3 days as needed for persistent symptoms.  Dispense: 3 tablet; Refill: 0  General Counseling: CKeileighverbalizes understanding of the findings of todays visit and agrees with plan of treatment. I have discussed any further diagnostic evaluation that may be needed or ordered today. We also reviewed her medications today. she has been encouraged to call the office with any questions or concerns that should arise related to todays visit.    Counseling:  This patient was seen by HBuffalowith Dr FLavera Guiseas a part of collaborative care agreement  Meds ordered this encounter  Medications  . ciprofloxacin-dexamethasone (CIPRODEX) OTIC suspension    Sig: Place 4 drops into the right ear 2 (two) times daily.    Dispense:  7.5 mL    Refill:  0    Order Specific Question:   Supervising Provider    Answer:   KLavera Guise[[[id number]] . nystatin ointment (MYCOSTATIN)    Sig: Apply 1 application topically 3 (three) times daily.    Dispense:  30 g  Refill:  1    Order Specific Question:   Supervising Provider    Answer:   Lavera Guise [[id number]]  . fluconazole (DIFLUCAN) 150 MG tablet    Sig:  Take 1 tablet po once. May repeat dose in 3 days as needed for persistent symptoms.    Dispense:  3 tablet    Refill:  0    Order Specific Question:   Supervising Provider    Answer:   Lavera Guise [[id number]]    Time spent: 25 Minutes

## 2018-03-31 ENCOUNTER — Ambulatory Visit: Payer: Self-pay | Admitting: Nurse Practitioner

## 2018-03-31 ENCOUNTER — Telehealth: Payer: Self-pay

## 2018-03-31 ENCOUNTER — Other Ambulatory Visit: Payer: Self-pay | Admitting: Nurse Practitioner

## 2018-03-31 DIAGNOSIS — F410 Panic disorder [episodic paroxysmal anxiety] without agoraphobia: Secondary | ICD-10-CM

## 2018-03-31 DIAGNOSIS — F411 Generalized anxiety disorder: Principal | ICD-10-CM

## 2018-03-31 MED ORDER — DIAZEPAM 5 MG PO TABS: ORAL_TABLET | ORAL | 2 refills | Status: DC

## 2018-03-31 NOTE — Telephone Encounter (Signed)
Approved current prescription for diazepam per request.

## 2018-03-31 NOTE — Progress Notes (Signed)
Approved current prescription for diazepam per request.

## 2018-04-01 NOTE — Telephone Encounter (Signed)
Attempted to call pt to let her know her prescription for diazepam was sent to her pharmacy and no answer, received a recording stating that the number is no longer in service or has changed. TLW

## 2018-04-08 ENCOUNTER — Ambulatory Visit: Payer: Medicaid Other | Admitting: Nurse Practitioner

## 2018-04-08 ENCOUNTER — Encounter: Payer: Self-pay | Admitting: Nurse Practitioner

## 2018-04-08 VITALS — BP 111/75 | HR 82 | Resp 16 | Ht 67.5 in | Wt 189.6 lb

## 2018-04-08 DIAGNOSIS — M1A9XX0 Chronic gout, unspecified, without tophus (tophi): Secondary | ICD-10-CM

## 2018-04-08 DIAGNOSIS — E785 Hyperlipidemia, unspecified: Secondary | ICD-10-CM

## 2018-04-08 DIAGNOSIS — R109 Unspecified abdominal pain: Secondary | ICD-10-CM

## 2018-04-08 DIAGNOSIS — R319 Hematuria, unspecified: Secondary | ICD-10-CM | POA: Diagnosis not present

## 2018-04-08 DIAGNOSIS — F331 Major depressive disorder, recurrent, moderate: Secondary | ICD-10-CM

## 2018-04-08 DIAGNOSIS — N390 Urinary tract infection, site not specified: Secondary | ICD-10-CM | POA: Diagnosis not present

## 2018-04-08 DIAGNOSIS — F410 Panic disorder [episodic paroxysmal anxiety] without agoraphobia: Secondary | ICD-10-CM

## 2018-04-08 DIAGNOSIS — E538 Deficiency of other specified B group vitamins: Secondary | ICD-10-CM

## 2018-04-08 DIAGNOSIS — F4310 Post-traumatic stress disorder, unspecified: Secondary | ICD-10-CM | POA: Diagnosis not present

## 2018-04-08 DIAGNOSIS — F411 Generalized anxiety disorder: Secondary | ICD-10-CM

## 2018-04-08 DIAGNOSIS — R609 Edema, unspecified: Secondary | ICD-10-CM

## 2018-04-08 DIAGNOSIS — E1165 Type 2 diabetes mellitus with hyperglycemia: Secondary | ICD-10-CM

## 2018-04-08 MED ORDER — GEMFIBROZIL 600 MG PO TABS: 600.0000 mg | ORAL_TABLET | Freq: Two times a day (BID) | ORAL | 3 refills | Status: DC

## 2018-04-08 MED ORDER — CIPROFLOXACIN HCL 500 MG PO TABS: 500.0000 mg | ORAL_TABLET | Freq: Two times a day (BID) | ORAL | 0 refills | Status: DC

## 2018-04-08 MED ORDER — FUROSEMIDE 20 MG PO TABS: 20.0000 mg | ORAL_TABLET | Freq: Every day | ORAL | 3 refills | Status: DC | PRN

## 2018-04-08 MED ORDER — TIZANIDINE HCL 4 MG PO TABS: 4.0000 mg | ORAL_TABLET | Freq: Two times a day (BID) | ORAL | 0 refills | Status: DC | PRN

## 2018-04-08 MED ORDER — "PEN NEEDLES 5/16"" 30G X 8 MM MISC": 5 refills | Status: AC

## 2018-04-08 MED ORDER — LIRAGLUTIDE 18 MG/3ML ~~LOC~~ SOPN: PEN_INJECTOR | SUBCUTANEOUS | 3 refills | Status: DC

## 2018-04-08 MED ORDER — "SYRINGE 20G X 1"" 3 ML MISC": 11 refills | Status: DC

## 2018-04-08 MED ORDER — B-12 1000 MCG/ML IJ KIT: 1000.0000 ug | PACK | INTRAMUSCULAR | 3 refills | Status: DC

## 2018-04-08 MED ORDER — ALLOPURINOL 100 MG PO TABS: 200.0000 mg | ORAL_TABLET | Freq: Every day | ORAL | 3 refills | Status: DC

## 2018-04-08 MED ORDER — DULOXETINE HCL 60 MG PO CPEP: 60.0000 mg | ORAL_CAPSULE | Freq: Every day | ORAL | 3 refills | Status: DC

## 2018-04-08 NOTE — Progress Notes (Signed)
[facility] [place], [place] [address]  Internal MEDICINE  Office Visit Note  Patient Name: Holly Espinoza  [id number]  [id number]  Date of Service: 04/09/2018  Chief Complaint  Patient presents with  . Medical Management of Chronic Issues    4 month follow up    The patient is having a lot of increased anxiety after having stressful relationship wth her daughter. Recently gained custody of her granddaughter after her daughter moved to [place], leaving her daughter behind. Patient is having nightmares and night terrors. Has been waling in her sleep with no recollection of what she is doing or what she could do.   Urinary Tract Infection   This is a recurrent problem. The current episode started in the past 7 days. The problem occurs intermittently. The problem has been gradually worsening. The quality of the pain is described as aching. The pain is at a severity of 5/10. The pain is moderate. There has been no fever. She is not sexually active. There is a history of pyelonephritis. Associated symptoms include chills, flank pain, frequency, hematuria, hesitancy, nausea and urgency. Pertinent negatives include no vomiting. She has tried acetaminophen and home medications for the symptoms. The treatment provided mild relief. Her past medical history is significant for recurrent UTIs.  Diabetes  She presents for her follow-up diabetic visit. She has type 2 diabetes mellitus. No MedicAlert identification noted. Her disease course has been improving. Hypoglycemia symptoms include headaches and nervousness/anxiousness. Pertinent negatives for hypoglycemia include no dizziness. Associated symptoms include fatigue and weakness. Pertinent negatives for diabetes include no chest pain, no polydipsia and no polyuria. There are no hypoglycemic complications. Symptoms are improving. Diabetic complications include peripheral neuropathy and PVD. Risk factors for coronary artery disease  include diabetes mellitus, dyslipidemia, hypertension, stress, post-menopausal, sedentary lifestyle and tobacco exposure. Current diabetic treatments: bydurean. She is compliant with treatment all of the time. Her weight is stable. She is following a generally healthy diet. Meal planning includes avoidance of concentrated sweets. She has not had a previous visit with a dietitian. She rarely participates in exercise. There is no change in her home blood glucose trend. An ACE inhibitor/angiotensin II receptor blocker is not being taken. She does not see a podiatrist.Eye exam is current.       Current Medication: Outpatient Encounter Medications as of 04/08/2018  Medication Sig  . allopurinol (ZYLOPRIM) 100 MG tablet Take 2 tablets (200 mg total) by mouth at bedtime.  Marland Kitchen aspirin 81 MG chewable tablet Chew 81 mg by mouth daily.  Marland Kitchen BELBUCA 450 MCG FILM 450 mcg.  . Colchicine (MITIGARE) 0.6 MG CAPS Take 1.2 mg by mouth See admin instructions. At onset of gout flare. May repeat one additional capsule 1 hour later if symptoms persist.  . Cyanocobalamin (B-12) 1000 MCG/ML KIT Inject 1,000 mcg as directed every 30 (thirty) days.  . diazepam (VALIUM) 5 MG tablet Take 1 tablet po bid prn for muscle pain and tightness.  . DULoxetine (CYMBALTA) 60 MG capsule Take 1 capsule (60 mg total) by mouth daily.  Marland Kitchen estradiol (ESTRACE) 0.1 MG/GM vaginal cream Place 1 Applicatorful vaginally 3 (three) times a week. 3 times a week for 3weeks and then once weekly  . Exenatide ER (BYDUREON) 2 MG PEN Inject 2 mg into the skin once a week.  . fluconazole (DIFLUCAN) 150 MG tablet Take 1 tablet po once. May repeat dose in 3 days as needed for persistent symptoms.  . furosemide (LASIX) 20 MG tablet  Take 1 tablet (20 mg total) by mouth daily as needed for fluid or edema.  Marland Kitchen gemfibrozil (LOPID) 600 MG tablet Take 1 tablet (600 mg total) by mouth 2 (two) times daily before a meal.  . Insulin Pen Needle (PEN NEEDLES 5/16") 30G X 8 MM  MISC To use every day with victoza injections - E11.65  . levothyroxine (SYNTHROID, LEVOTHROID) 75 MCG tablet Take 1 tablet (75 mcg total) by mouth daily before breakfast.  . liraglutide (VICTOZA) 18 MG/3ML SOPN Inject  1.8 mg [place]  daily  . mirtazapine (REMERON) 15 MG tablet Take 1 tablet (15 mg total) by mouth at bedtime.  Marland Kitchen nystatin ointment (MYCOSTATIN) Apply 1 application topically 3 (three) times daily.  Marland Kitchen omeprazole (PRILOSEC) 40 MG capsule Take 1 capsule (40 mg total) by mouth daily.  . ondansetron (ZOFRAN) 4 MG tablet Take 4 mg by mouth every 8 (eight) hours as needed for nausea. For nausea  . oxyCODONE-acetaminophen (PERCOCET) 5-325 MG tablet Take 1 tablet by mouth every 4 (four) hours as needed for severe pain.  . OXYCONTIN 10 MG 12 hr tablet   . phenazopyridine (PYRIDIUM) 200 MG tablet Take 1 tablet (200 mg total) by mouth 3 (three) times daily as needed for pain.  . pregabalin (LYRICA) 50 MG capsule Take 50 mg by mouth 2 (two) times daily.  Marland Kitchen topiramate (TOPAMAX) 100 MG tablet Take 100 mg by mouth 2 (two) times daily.  . [DISCONTINUED] allopurinol (ZYLOPRIM) 100 MG tablet Take 2 tablets (200 mg total) by mouth at bedtime.  . [DISCONTINUED] ciprofloxacin-dexamethasone (CIPRODEX) OTIC suspension Place 4 drops into the right ear 2 (two) times daily.  . [DISCONTINUED] Cyanocobalamin (B-12) 1000 MCG/ML KIT Inject 1,000 mcg as directed every 30 (thirty) days.  . [DISCONTINUED] DULoxetine (CYMBALTA) 60 MG capsule Take 1 capsule (60 mg total) by mouth daily.  . [DISCONTINUED] furosemide (LASIX) 20 MG tablet Take 1 tablet (20 mg total) by mouth daily as needed for fluid or edema.  . [DISCONTINUED] gemfibrozil (LOPID) 600 MG tablet Take 1 tablet (600 mg total) by mouth 2 (two) times daily before a meal.  . [DISCONTINUED] Insulin Pen Needle (PEN NEEDLES 5/16") 30G X 8 MM MISC To use every day with victoza injections - E11.65  . [DISCONTINUED] liraglutide (VICTOZA) 18 MG/3ML SOPN Inject  1.8 mg [place]   daily  . [DISCONTINUED] metroNIDAZOLE (FLAGYL) 500 MG tablet Take 1 tablet (500 mg total) by mouth 2 (two) times daily.  . ciprofloxacin (CIPRO) 500 MG tablet Take 1 tablet (500 mg total) by mouth 2 (two) times daily.  . Syringe/Needle, Disp, (SYRINGE 3CC/20GX1") 20G X 1" 3 ML MISC To sue for B12 injections once monthly  . tiZANidine (ZANAFLEX) 4 MG tablet Take 1 tablet (4 mg total) by mouth 2 (two) times daily as needed for muscle spasms.   No facility-administered encounter medications on file as of 04/08/2018.     Surgical History: Past Surgical History:  Procedure Laterality Date  . ABDOMINAL HYSTERECTOMY  2001?  . ANTERIOR AND POSTERIOR REPAIR N/A 11/01/2015   Procedure: CYSTOSCOPY, ANTERIOR (CYSTOCELE) AND POSTERIOR REPAIR (RECTOCELE);  Surgeon: Bjorn Loser, MD;  Location: [facility];  Service: Urology;  Laterality: N/A;  . ANTERIOR CERVICAL DECOMP/DISCECTOMY FUSION  2012  . BACK SURGERY  2001   laminectomy  . BREAST BIOPSY Left 2014  . BREAST LUMPECTOMY Left 02/25/2013  . BREAST SURGERY Left 02/25/13   lumpectomy  . CHOLECYSTECTOMY  1980's  . DILATION AND CURETTAGE OF UTERUS  [phone]   "  probably 3" (07/08/2012)  . FRACTURE SURGERY     C4-7 Surgery for fracture  . LUMBAR WOUND DEBRIDEMENT N/A 08/12/2012   Procedure: LUMBAR WOUND DEBRIDEMENT;  Surgeon: Faythe Ghee, MD;  Location: [facility];  Service: Neurosurgery;  Laterality: N/A;  Lumbar Wound Debridement  . POSTERIOR LUMBAR FUSION  07/08/2012  . TONSILLECTOMY  ~ 1968    Medical History: Past Medical History:  Diagnosis Date  . Anxiety   . Arthritis    "pretty much from my neck to my feet" (07/08/2012)  . B12 deficiency anemia   . Chronic lower back pain   . Depression   . Diabetic peripheral neuropathy ([place])   . Gout   . High cholesterol   . 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 Neuromuscular disorder ([place])   . Rheumatoid arthritis ([place])   . Type II diabetes mellitus (HCC)     recent hgb A1C was 5.4 per Dr. Humphrey Rolls, [place], [place]    Family History: Family History  Problem Relation Age of Onset  . Kidney disease Father   . Cancer Other        breast great grandmother  . Breast cancer Other     Social History   Socioeconomic History  . Marital status: Single    Spouse name: Not on file  . Number of children: Not on file  . Years of education: Not on file  . Highest education level: Not on file  Occupational History  . Not on file  Social Needs  . Financial resource strain: Not on file  . Food insecurity:    Worry: Not on file    Inability: Not on file  . Transportation needs:    Medical: Not on file    Non-medical: Not on file  Tobacco Use  . Smoking status: Current Every Day Smoker    Packs/day: 0.50    Years: 40.00    Pack years: 20.00    Types: Cigarettes  . Smokeless tobacco: Never Used  Substance and Sexual Activity  . Alcohol use: No  . Drug use: No    Types: Marijuana, Cocaine    Comment: 07/08/2012 "last drug use >8 yr ago" (07/08/2012)  . Sexual activity: Yes    Comment: quit a long time ago  Lifestyle  . Physical activity:    Days per week: Not on file    Minutes per session: Not on file  . Stress: Not on file  Relationships  . Social connections:    Talks on phone: Not on file    Gets together: Not on file    Attends religious service: Not on file    Active member of club or organization: Not on file    Attends meetings of clubs or organizations: Not on file    Relationship status: Not on file  . Intimate partner violence:    Fear of current or ex partner: Not on file    Emotionally abused: Not on file    Physically abused: Not on file    Forced sexual activity: Not on file  Other Topics Concern  . Not on file  Social History Narrative  . Not on file      Review of Systems  Constitutional: Positive for chills and fatigue. Negative for appetite change and fever.  HENT: Negative for congestion, ear pain, postnasal  drip, rhinorrhea, sinus pain, sneezing, sore throat and voice change.   Eyes: Negative.   Respiratory: Negative for cough and wheezing.  Cardiovascular: Negative for chest pain and palpitations.  Gastrointestinal: Positive for nausea. Negative for vomiting.  Endocrine: Negative for cold intolerance, heat intolerance, polydipsia and polyuria.       Blood sugars doing well   Genitourinary: Positive for dysuria, flank pain, frequency, hematuria, hesitancy and urgency.  Musculoskeletal: Positive for arthralgias, back pain and myalgias.  Skin: Negative for rash.  Allergic/Immunologic: Positive for environmental allergies.  Neurological: Positive for weakness and headaches. Negative for dizziness.  Hematological: Positive for adenopathy.  Psychiatric/Behavioral: Positive for dysphoric mood. The patient is nervous/anxious.     Today's Vitals   04/08/18 1638  BP: 111/75  Pulse: 82  Resp: 16  SpO2: 99%  Weight: 189 lb 9.6 oz (86 kg)  Height: 5' 7.5" (1.715 m)   Body mass index is 29.26 kg/m.  Physical Exam Vitals signs and nursing note reviewed.  Constitutional:      General: She is not in acute distress.    Appearance: Normal appearance. She is well-developed. She is not diaphoretic.  HENT:     Head: Normocephalic and atraumatic.     Mouth/Throat:     Pharynx: No oropharyngeal exudate.  Eyes:     Pupils: Pupils are equal, round, and reactive to light.  Neck:     Musculoskeletal: Normal range of motion and neck supple.     Thyroid: No thyromegaly.     Vascular: No JVD.     Trachea: No tracheal deviation.  Cardiovascular:     Rate and Rhythm: Normal rate and regular rhythm.     Heart sounds: Normal heart sounds. No murmur. No friction rub. No gallop.   Pulmonary:     Effort: Pulmonary effort is normal. No respiratory distress.     Breath sounds: Normal breath sounds. No wheezing or rales.  Chest:     Chest wall: No tenderness.  Abdominal:     General: Bowel sounds are  normal.     Palpations: Abdomen is soft.  Genitourinary:    Comments: Urine sample showing large blood, large WBC and is positive for protein.  Musculoskeletal: Normal range of motion.  Lymphadenopathy:     Cervical: No cervical adenopathy.  Skin:    General: Skin is warm and dry.  Neurological:     Mental Status: She is alert and oriented to person, place, and time. Mental status is at baseline.     Cranial Nerves: No cranial nerve deficit.  Psychiatric:        Attention and Perception: Attention and perception normal.        Mood and Affect: Mood is anxious and depressed.        Speech: Speech normal.        Behavior: Behavior normal. Behavior is cooperative.        Thought Content: Thought content normal.        Cognition and Memory: Cognition and memory normal.        Judgment: Judgment normal.    Assessment/Plan: 1. Urinary tract infection with hematuria, site unspecified Start treatment with cipro 546m twice daily for 10 days. Send urine for culture and sensitivity and adjust antibiotics as indicated.  - CULTURE, URINE COMPREHENSIVE - ciprofloxacin (CIPRO) 500 MG tablet; Take 1 tablet (500 mg total) by mouth 2 (two) times daily.  Dispense: 20 tablet; Refill: 0 - POCT Urinalysis Dipstick  2. Right flank pain Add tizanidine 468mtwice daily as needed for next few days to help relieve back pain. Advised she take with caution as it may cause  dizziness or drowsiness. Also advised her to leave few hours in between antibiotic and tizanidine, as this combination can increase negative side effects.  - tiZANidine (ZANAFLEX) 4 MG tablet; Take 1 tablet (4 mg total) by mouth 2 (two) times daily as needed for muscle spasms.  Dispense: 20 tablet; Refill: 0  3. Moderate episode of recurrent major depressive disorder (HCC) Worsening symptoms. Now having night terrors and nightmares. Refer to psychiatry. No medication changes made. - Ambulatory referral to Psychiatry  4. Post traumatic  stress disorder (PTSD) Worsening symptoms. Now having night terrors and nightmares. Refer to psychiatry. No medication changes made. - Ambulatory referral to Psychiatry  5. Generalized anxiety disorder with panic attacks Worsening symptoms. Now having night terrors and nightmares. Refer to psychiatry. No medication changes made. - DULoxetine (CYMBALTA) 60 MG capsule; Take 1 capsule (60 mg total) by mouth daily.  Dispense: 30 capsule; Refill: 3  6. Edema, unspecified type - furosemide (LASIX) 20 MG tablet; Take 1 tablet (20 mg total) by mouth daily as needed for fluid or edema.  Dispense: 30 tablet; Refill: 3  7. Hyperlipidemia, unspecified hyperlipidemia type - gemfibrozil (LOPID) 600 MG tablet; Take 1 tablet (600 mg total) by mouth 2 (two) times daily before a meal.  Dispense: 60 tablet; Refill: 3  8. Uncontrolled type 2 diabetes mellitus with hyperglycemia (HCC) Stable. Continue victoza as prescribed . - Insulin Pen Needle (PEN NEEDLES 5/16") 30G X 8 MM MISC; To use every day with victoza injections - E11.65  Dispense: 30 each; Refill: 5 - liraglutide (VICTOZA) 18 MG/3ML SOPN; Inject  1.8 mg [place]  daily  Dispense: 5 pen; Refill: 3  9. Vitamin B12 deficiency - Cyanocobalamin (B-12) 1000 MCG/ML KIT; Inject 1,000 mcg as directed every 30 (thirty) days.  Dispense: 1 kit; Refill: 3 - Syringe/Needle, Disp, (SYRINGE 3CC/20GX1") 20G X 1" 3 ML MISC; To sue for B12 injections once monthly  Dispense: 1 each; Refill: 11  10. Chronic gout without tophus, unspecified cause, unspecified site Continue allopruinol every day to prevent gout flares.  - allopurinol (ZYLOPRIM) 100 MG tablet; Take 2 tablets (200 mg total) by mouth at bedtime.  Dispense: 30 tablet; Refill: 3  General Counseling: Cydnee verbalizes understanding of the findings of todays visit and agrees with plan of treatment. I have discussed any further diagnostic evaluation that may be needed or ordered today. We also reviewed her medications  today. she has been encouraged to call the office with any questions or concerns that should arise related to todays visit.  Diabetes Counseling:  1. Addition of ACE inh/ ARB'S for nephroprotection. Microalbumin is updated  2. Diabetic foot care, prevention of complications. Podiatry consult 3. Exercise and lose weight.  4. Diabetic eye examination, Diabetic eye exam is updated  5. Monitor blood sugar closlely. nutrition counseling.  6. Sign and symptoms of hypoglycemia including shaking sweating,confusion and headaches.  This patient was seen by Leretha Pol FNP Collaboration with Dr Lavera Guise as a part of collaborative care agreement  Orders Placed This Encounter  Procedures  . CULTURE, URINE COMPREHENSIVE  . Ambulatory referral to Psychiatry    Meds ordered this encounter  Medications  . ciprofloxacin (CIPRO) 500 MG tablet    Sig: Take 1 tablet (500 mg total) by mouth 2 (two) times daily.    Dispense:  20 tablet    Refill:  0    Order Specific Question:   Supervising Provider    Answer:   Lavera Guise [[id number]]  . tiZANidine (  ZANAFLEX) 4 MG tablet    Sig: Take 1 tablet (4 mg total) by mouth 2 (two) times daily as needed for muscle spasms.    Dispense:  20 tablet    Refill:  0    Order Specific Question:   Supervising Provider    Answer:   Lavera Guise [[id number]]  . allopurinol (ZYLOPRIM) 100 MG tablet    Sig: Take 2 tablets (200 mg total) by mouth at bedtime.    Dispense:  30 tablet    Refill:  3    Order Specific Question:   Supervising Provider    Answer:   Lavera Guise [[id number]]  . Cyanocobalamin (B-12) 1000 MCG/ML KIT    Sig: Inject 1,000 mcg as directed every 30 (thirty) days.    Dispense:  1 kit    Refill:  3    Order Specific Question:   Supervising Provider    Answer:   Lavera Guise [[id number]]  . DULoxetine (CYMBALTA) 60 MG capsule    Sig: Take 1 capsule (60 mg total) by mouth daily.    Dispense:  30 capsule    Refill:  3    Order Specific Question:   Supervising  Provider    Answer:   Lavera Guise [[id number]]  . furosemide (LASIX) 20 MG tablet    Sig: Take 1 tablet (20 mg total) by mouth daily as needed for fluid or edema.    Dispense:  30 tablet    Refill:  3    Order Specific Question:   Supervising Provider    Answer:   Lavera Guise [[id number]]  . gemfibrozil (LOPID) 600 MG tablet    Sig: Take 1 tablet (600 mg total) by mouth 2 (two) times daily before a meal.    Dispense:  60 tablet    Refill:  3    Order Specific Question:   Supervising Provider    Answer:   Lavera Guise [place]  . Insulin Pen Needle (PEN NEEDLES 5/16") 30G X 8 MM MISC    Sig: To use every day with victoza injections - E11.65    Dispense:  30 each    Refill:  5    Order Specific Question:   Supervising Provider    Answer:   Lavera Guise [place]  . liraglutide (VICTOZA) 18 MG/3ML SOPN    Sig: Inject  1.8 mg [place]  daily    Dispense:  5 pen    Refill:  3    Order Specific Question:   Supervising Provider    Answer:   Lavera Guise [place]  . Syringe/Needle, Disp, (SYRINGE 3CC/20GX1") 20G X 1" 3 ML MISC    Sig: To sue for B12 injections once monthly    Dispense:  1 each    Refill:  11    Order Specific Question:   Supervising Provider    Answer:   Lavera Guise [[id number]]    Time spent: 59 Minutes      Dr Lavera Guise Internal medicine

## 2018-04-09 DIAGNOSIS — R109 Unspecified abdominal pain: Secondary | ICD-10-CM | POA: Insufficient documentation

## 2018-04-09 DIAGNOSIS — M1A9XX0 Chronic gout, unspecified, without tophus (tophi): Secondary | ICD-10-CM | POA: Insufficient documentation

## 2018-04-09 DIAGNOSIS — F4310 Post-traumatic stress disorder, unspecified: Secondary | ICD-10-CM | POA: Insufficient documentation

## 2018-04-09 LAB — POCT URINALYSIS DIPSTICK
Bilirubin, UA: NEGATIVE
Glucose, UA: NEGATIVE
Ketones, UA: NEGATIVE
Nitrite, UA: NEGATIVE
Protein, UA: POSITIVE — AB
Spec Grav, UA: 1.01 (ref 1.010–1.025)
Urobilinogen, UA: 0.2 E.U./dL
pH, UA: 6 (ref 5.0–8.0)

## 2018-04-11 LAB — CULTURE, URINE COMPREHENSIVE

## 2018-05-07 ENCOUNTER — Other Ambulatory Visit: Payer: Self-pay

## 2018-05-07 DIAGNOSIS — K219 Gastro-esophageal reflux disease without esophagitis: Secondary | ICD-10-CM

## 2018-05-07 MED ORDER — OMEPRAZOLE 40 MG PO CPDR: 40.0000 mg | DELAYED_RELEASE_CAPSULE | Freq: Every day | ORAL | 3 refills | Status: DC

## 2018-05-28 ENCOUNTER — Ambulatory Visit: Payer: Medicaid Other | Admitting: Psychiatry

## 2018-06-09 ENCOUNTER — Other Ambulatory Visit: Payer: Self-pay

## 2018-06-09 DIAGNOSIS — E039 Hypothyroidism, unspecified: Secondary | ICD-10-CM

## 2018-06-09 DIAGNOSIS — F410 Panic disorder [episodic paroxysmal anxiety] without agoraphobia: Secondary | ICD-10-CM

## 2018-06-09 DIAGNOSIS — F411 Generalized anxiety disorder: Secondary | ICD-10-CM

## 2018-06-09 DIAGNOSIS — M1A9XX0 Chronic gout, unspecified, without tophus (tophi): Secondary | ICD-10-CM

## 2018-06-09 MED ORDER — MIRTAZAPINE 15 MG PO TABS: 15.0000 mg | ORAL_TABLET | Freq: Every day | ORAL | 3 refills | Status: DC

## 2018-06-09 MED ORDER — LEVOTHYROXINE SODIUM 75 MCG PO TABS: 75.0000 ug | ORAL_TABLET | Freq: Every day | ORAL | 3 refills | Status: DC

## 2018-06-09 MED ORDER — ALLOPURINOL 100 MG PO TABS: ORAL_TABLET | ORAL | 3 refills | Status: DC

## 2018-06-16 ENCOUNTER — Telehealth: Payer: Self-pay

## 2018-06-16 NOTE — Telephone Encounter (Signed)
Pt called that she is coughing,nosebleed,fatigue and little sob advised her we can see her tomorrow with video call visit and if she is worse need to go ED

## 2018-06-17 ENCOUNTER — Other Ambulatory Visit: Payer: Self-pay

## 2018-06-17 ENCOUNTER — Encounter: Payer: Self-pay | Admitting: Adult Health

## 2018-06-17 ENCOUNTER — Ambulatory Visit: Payer: Medicaid Other | Admitting: Adult Health

## 2018-06-17 VITALS — Resp 16 | Ht 67.5 in | Wt 188.0 lb

## 2018-06-17 DIAGNOSIS — J0110 Acute frontal sinusitis, unspecified: Secondary | ICD-10-CM

## 2018-06-17 DIAGNOSIS — J3489 Other specified disorders of nose and nasal sinuses: Secondary | ICD-10-CM

## 2018-06-17 MED ORDER — MUPIROCIN 2 % EX OINT: 1.0000 "application " | TOPICAL_OINTMENT | Freq: Two times a day (BID) | CUTANEOUS | 0 refills | Status: DC

## 2018-06-17 MED ORDER — SULFAMETHOXAZOLE-TRIMETHOPRIM 800-160 MG PO TABS: 1.0000 | ORAL_TABLET | Freq: Two times a day (BID) | ORAL | 0 refills | Status: DC

## 2018-06-17 NOTE — Progress Notes (Signed)
[facility] [place], [place] [address]  Internal MEDICINE  Telephone Visit  Patient Name: Holly Espinoza  [id number]  [id number]  Date of Service: 06/17/2018  I connected with the patient at 1130 by telephone and verified the patients identity using two identifiers.  I discussed the limitations, risks, security and privacy concerns of performing an evaluation and management service by telephone and the availability of in person appointments. I also discussed with the patient that there may be a patient responsible charge related to the service.  The patient expressed understanding and agrees to proceed.    Chief Complaint  Patient presents with  . Telephone Screen  . Cough    boyles in nostrils, blowing nose with blood coming out   . Fatigue  . Headache  . Sore Throat  . Telephone Assessment    HPI  PT reports one week ago she began to feel fatigue, and headache.  Now she had sinus pressures and pain, with PND, sore throat and cough.  She is a smoker currently.  She is also complaining of a sore inside her right nare.  She keeps her grandbaby who apparently has multiple staph infections per year.  Her daughter is currently working in nursing home where covid patients are.  However she does not provide direct care for them.  She is also no sick.  The patient believes she has a sinus infection however, she wants to rule out covid.    Current Medication: Outpatient Encounter Medications as of 06/17/2018  Medication Sig  . allopurinol (ZYLOPRIM) 100 MG tablet Take 2 tab po at bedtime  . aspirin 81 MG chewable tablet Chew 81 mg by mouth daily.  . Cyanocobalamin (B-12) 1000 MCG/ML KIT Inject 1,000 mcg as directed every 30 (thirty) days.  . diazepam (VALIUM) 5 MG tablet Take 1 tablet po bid prn for muscle pain and tightness.  . DULoxetine (CYMBALTA) 60 MG capsule Take 1 capsule (60 mg total) by mouth daily.  . fexofenadine (ALLEGRA) 180 MG tablet Take 180 mg by mouth  daily.  . furosemide (LASIX) 20 MG tablet Take 1 tablet (20 mg total) by mouth daily as needed for fluid or edema.  Marland Kitchen gemfibrozil (LOPID) 600 MG tablet Take 1 tablet (600 mg total) by mouth 2 (two) times daily before a meal.  . Insulin Pen Needle (PEN NEEDLES 5/16") 30G X 8 MM MISC To use every day with victoza injections - E11.65  . levothyroxine (SYNTHROID, LEVOTHROID) 75 MCG tablet Take 1 tablet (75 mcg total) by mouth daily before breakfast.  . liraglutide (VICTOZA) 18 MG/3ML SOPN Inject  1.8 mg [place]  daily  . mirtazapine (REMERON) 15 MG tablet Take 1 tablet (15 mg total) by mouth at bedtime.  Marland Kitchen nystatin ointment (MYCOSTATIN) Apply 1 application topically 3 (three) times daily.  Marland Kitchen omeprazole (PRILOSEC) 40 MG capsule Take 1 capsule (40 mg total) by mouth daily.  . ondansetron (ZOFRAN) 4 MG tablet Take 4 mg by mouth every 8 (eight) hours as needed for nausea. For nausea  . oxyCODONE-acetaminophen (PERCOCET) 5-325 MG tablet Take 1 tablet by mouth every 4 (four) hours as needed for severe pain.  . OXYCONTIN 10 MG 12 hr tablet   . pregabalin (LYRICA) 50 MG capsule Take 50 mg by mouth 2 (two) times daily.  . Syringe/Needle, Disp, (SYRINGE 3CC/20GX1") 20G X 1" 3 ML MISC To sue for B12 injections once monthly  . topiramate (TOPAMAX) 100 MG tablet Take 100 mg by mouth  2 (two) times daily.  . Colchicine (MITIGARE) 0.6 MG CAPS Take 1.2 mg by mouth See admin instructions. At onset of gout flare. May repeat one additional capsule 1 hour later if symptoms persist.  . estradiol (ESTRACE) 0.1 MG/GM vaginal cream Place 1 Applicatorful vaginally 3 (three) times a week. 3 times a week for 3weeks and then once weekly (Patient not taking: Reported on 06/17/2018)  . Exenatide ER (BYDUREON) 2 MG PEN Inject 2 mg into the skin once a week. (Patient not taking: Reported on 06/17/2018)  . fluconazole (DIFLUCAN) 150 MG tablet Take 1 tablet po once. May repeat dose in 3 days as needed for persistent symptoms. (Patient not  taking: Reported on 06/17/2018)  . mupirocin ointment (BACTROBAN) 2 % Place 1 application into the nose 2 (two) times daily.  . phenazopyridine (PYRIDIUM) 200 MG tablet Take 1 tablet (200 mg total) by mouth 3 (three) times daily as needed for pain. (Patient not taking: Reported on 06/17/2018)  . sulfamethoxazole-trimethoprim (BACTRIM DS,SEPTRA DS) 800-160 MG tablet Take 1 tablet by mouth 2 (two) times daily.  . [DISCONTINUED] BELBUCA 450 MCG FILM 450 mcg.  . [DISCONTINUED] ciprofloxacin (CIPRO) 500 MG tablet Take 1 tablet (500 mg total) by mouth 2 (two) times daily. (Patient not taking: Reported on 06/17/2018)  . [DISCONTINUED] tiZANidine (ZANAFLEX) 4 MG tablet Take 1 tablet (4 mg total) by mouth 2 (two) times daily as needed for muscle spasms. (Patient not taking: Reported on 06/17/2018)   No facility-administered encounter medications on file as of 06/17/2018.     Surgical History: Past Surgical History:  Procedure Laterality Date  . ABDOMINAL HYSTERECTOMY  2001?  . ANTERIOR AND POSTERIOR REPAIR N/A 11/01/2015   Procedure: CYSTOSCOPY, ANTERIOR (CYSTOCELE) AND POSTERIOR REPAIR (RECTOCELE);  Surgeon: Bjorn Loser, MD;  Location: [facility];  Service: Urology;  Laterality: N/A;  . ANTERIOR CERVICAL DECOMP/DISCECTOMY FUSION  2012  . BACK SURGERY  2001   laminectomy  . BREAST BIOPSY Left 2014  . BREAST LUMPECTOMY Left 02/25/2013  . BREAST SURGERY Left 02/25/13   lumpectomy  . CHOLECYSTECTOMY  1980's  . DILATION AND CURETTAGE OF UTERUS  [phone]   "probably 3" (07/08/2012)  . FRACTURE SURGERY     C4-7 Surgery for fracture  . LUMBAR WOUND DEBRIDEMENT N/A 08/12/2012   Procedure: LUMBAR WOUND DEBRIDEMENT;  Surgeon: Faythe Ghee, MD;  Location: [facility];  Service: Neurosurgery;  Laterality: N/A;  Lumbar Wound Debridement  . POSTERIOR LUMBAR FUSION  07/08/2012  . TONSILLECTOMY  ~ 1968    Medical History: Past Medical History:  Diagnosis Date  . Anxiety   . Arthritis    "pretty much from  my neck to my feet" (07/08/2012)  . B12 deficiency anemia   . Chronic lower back pain   . Depression   . Diabetic peripheral neuropathy ([place])   . Gout   . High cholesterol   . 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 Neuromuscular disorder ([place])   . Rheumatoid arthritis ([place])   . Type II diabetes mellitus ([facility])    recent hgb A1C was 5.4 per Dr. Humphrey Rolls, [place], [place]    Family History: Family History  Problem Relation Age of Onset  . Kidney disease Father   . Cancer Other        breast great grandmother  . Breast cancer Other     Social History   Socioeconomic History  . Marital status: Single    Spouse name: Not on file  .  Number of children: Not on file  . Years of education: Not on file  . Highest education level: Not on file  Occupational History  . Not on file  Social Needs  . Financial resource strain: Not on file  . Food insecurity:    Worry: Not on file    Inability: Not on file  . Transportation needs:    Medical: Not on file    Non-medical: Not on file  Tobacco Use  . Smoking status: Current Every Day Smoker    Packs/day: 0.50    Years: 40.00    Pack years: 20.00    Types: Cigarettes  . Smokeless tobacco: Never Used  Substance and Sexual Activity  . Alcohol use: No  . Drug use: No    Types: Marijuana, Cocaine    Comment: 07/08/2012 "last drug use >8 yr ago" (07/08/2012)  . Sexual activity: Yes    Comment: quit a long time ago  Lifestyle  . Physical activity:    Days per week: Not on file    Minutes per session: Not on file  . Stress: Not on file  Relationships  . Social connections:    Talks on phone: Not on file    Gets together: Not on file    Attends religious service: Not on file    Active member of club or organization: Not on file    Attends meetings of clubs or organizations: Not on file    Relationship status: Not on file  . Intimate partner violence:    Fear of current or ex partner: Not on file    Emotionally  abused: Not on file    Physically abused: Not on file    Forced sexual activity: Not on file  Other Topics Concern  . Not on file  Social History Narrative  . Not on file      Review of Systems  Constitutional: Negative for chills, fatigue and unexpected weight change.  HENT: Positive for ear pain, nosebleeds, postnasal drip, rhinorrhea, sinus pressure and sinus pain. Negative for congestion, sneezing and sore throat.   Eyes: Negative for photophobia, pain and redness.  Respiratory: Positive for cough. Negative for chest tightness and shortness of breath.   Cardiovascular: Negative for chest pain and palpitations.  Gastrointestinal: Negative for abdominal pain, constipation, diarrhea, nausea and vomiting.  Endocrine: Negative.   Genitourinary: Negative for dysuria and frequency.  Musculoskeletal: Negative for arthralgias, back pain, joint swelling and neck pain.  Skin: Negative for rash.  Allergic/Immunologic: Negative.   Neurological: Negative for tremors and numbness.  Hematological: Negative for adenopathy. Does not bruise/bleed easily.  Psychiatric/Behavioral: Negative for behavioral problems and sleep disturbance. The patient is not nervous/anxious.     Vital Signs: Resp 16   Ht 5' 7.5" (1.715 m)   Wt 188 lb (85.3 kg)   BMI 29.01 kg/m    Observation/Objective: Appears well, non toxic.  Cough with congestion noted, pt is a smoker but has not been smoking in the last week.     Assessment/Plan: 1. Acute non-recurrent frontal sinusitis Advised patient to take entire course of antibiotics as prescribed with food. Pt should return to clinic in 7-10 days if symptoms fail to improve or new symptoms develop.  - sulfamethoxazole-trimethoprim (BACTRIM DS,SEPTRA DS) 800-160 MG tablet; Take 1 tablet by mouth 2 (two) times daily.  Dispense: 20 tablet; Refill: 0  2. Sore in nose Pt provided with bactroban, given patients grand-daughters history of staph in the home.  - mupirocin  ointment (  BACTROBAN) 2 %; Place 1 application into the nose 2 (two) times daily.  Dispense: 22 g; Refill: 0  General Counseling: Jaleeya verbalizes understanding of the findings of today's phone visit and agrees with plan of treatment. I have discussed any further diagnostic evaluation that may be needed or ordered today. We also reviewed her medications today. she has been encouraged to call the office with any questions or concerns that should arise related to todays visit.    No orders of the defined types were placed in this encounter.   Meds ordered this encounter  Medications  . mupirocin ointment (BACTROBAN) 2 %    Sig: Place 1 application into the nose 2 (two) times daily.    Dispense:  22 g    Refill:  0  . sulfamethoxazole-trimethoprim (BACTRIM DS,SEPTRA DS) 800-160 MG tablet    Sig: Take 1 tablet by mouth 2 (two) times daily.    Dispense:  20 tablet    Refill:  0    Time spent: [place] AGNP-C Internal medicine

## 2018-06-17 NOTE — Patient Instructions (Signed)
MRSA Infection, Adult  Methicillin-resistant Staphylococcus aureus (MRSA) infection is caused by a bacteria (staphylococcus aureus or staph) that no longer responds to common antibiotic medicines (drug-resistant bacteria). MRSA infection can be hard to treat.  Most times, MRSA can be on the skin or in the nose without causing problems. However, if MRSA enters the body through a cut or sore, it can cause a serious infection, such as:   Skin infections.   Bone or joint infections.   Pneumonia.   Bloodstream infections (sepsis).  There are two types of MRSA:   Health care-associated MRSA. This is an infection that you get during a stay in a hospital, rehabilitation facility, nursing home, or other health care facility.   Community-associated MRSA. This is an infection that occurs after exposure to the bacteria in daily activities, such as sports, child care centers, or sharing personal items with someone who is infected with MRSA.  What are the causes?  This condition is caused by a bacteria. Illness may develop after exposure to the bacteria through:   Skin-to-skin contact with someone who is infected with MRSA.   Touching surfaces that have the bacteria.   Having a procedure or equipment that allows MRSA to enter the body.   Having MRSA living on your skin, and getting a cut or scratch that allows bacteria to enter your body.  What increases the risk?  Health care-associated MRSA  You are more likely to develop this condition if you:   Have surgery or a procedure.   Have an IV or a thin tube (catheter) placed in your body.   Are elderly.   Are on kidney dialysis.   Have recently taken an antibiotic.   Live in a long-term care facility.  Community-associated MRSA  You are more likely to develop this condition if you:   Have an infected wound or skin ulcer.   Have a weak body defense system (immune system).   Have recently taken an antibiotic.   Play sports that involve skin-to-skin contact.   Live  in a crowded setting, like a dormitory or military barracks.   Share towels, razors, or sports equipment with other people.  What are the signs or symptoms?  Symptoms of this condition depend on the area that is affected. Symptoms may include:   A pus-filled pimple or boil.   Pus draining from your skin.   A sore (abscess) under your skin or somewhere in your body.   Fever with or without chills.  How is this diagnosed?  This condition may be diagnosed based on:   A physical exam and medical history.   Taking a sample from the infected area and growing it in a lab (culture).  You may also have other tests, including:   Imaging tests such as X-rays, CT scan, or MRI.   Lab tests such as blood, urine, or sputum tests.  You may be screened for MRSA on your skin or in your nose when you are admitted to a health care facility for a procedure.  How is this treated?  Treatment depends on the type of MRSA and how severe, deep, or extensive the infection is. Treatment may include:   Antibiotic medicines.   Surgery to drain pus from the infected area.  Severe infections may require a hospital stay.  Follow these instructions at home:  Medicines   Take over-the-counter and prescription medicines only as told by your health care provider.   Take antibiotic medicine as told by your   health care provider. Do not stop taking the antibiotic even if you start to feel better.  Hand washing     Wash your hands often with soap and water. If soap and water are not available, use an alcohol-based hand sanitizer.   Make sure that everyone in your household washes their hands often, too.  Wound care   If you have a wound, wash your hands before and after changing your bandage (dressing). Follow your health care provider's instructions for wound care.   Clean wounds, cuts, and abrasions with soap and water and cover them with dry, germ-free (sterile) dressings until they heal.   Check your wound every day for signs of  infection. Check for:  ? More redness, swelling, or pain.  ? More fluid or blood.  ? Warmth.  ? Pus or a bad smell.   Ask your health care provider if you should have a test (wound culture) for MRSA and other bacteria.  General instructions   Clean surfaces regularly to remove germs (disinfection). Use products or solutions that contain bleach. Make sure you disinfect bathroom surfaces, food preparation areas, and doorknobs.   Wash towels, bedding, and clothes in the washing machine with detergent and hot water. Dry them in a hot dryer.   Always shower after playing sports or exercising.   Avoid close contact with those around you as much as possible. Do not use towels, razors, toothbrushes, bedding, or other items that will be used by others.   If you are breastfeeding, talk to your health care provider about MRSA. You may be asked to temporarily stop breastfeeding.   Tell all your health care providers that you have MRSA.   Keep all follow-up visits as told by your health care provider. This is important.  Contact a health care provider if you:   Do not get better.   Have symptoms that get worse.   Have new symptoms.  Get help right away if you have:   Nausea, or you vomit, or you cannot take medicine without vomiting.   Trouble breathing.   Chest pain.  Summary   MRSA is an infection caused by a type of staphylococcus (staph) bacteria that does not respond to common antibiotic medicines.   Treatment for this condition depends on the type of MRSA infection and how severe, deep, and extensive the infection is.   Make sure you know the signs of a MRSA infection and when to contact a health care provider.  This information is not intended to replace advice given to you by your health care provider. Make sure you discuss any questions you have with your health care provider.  Document Released: 02/26/2005 Document Revised: 04/10/2017 Document Reviewed: 04/10/2017  Elsevier Interactive Patient  Education  2019 [employer]

## 2018-06-23 ENCOUNTER — Telehealth: Payer: Self-pay

## 2018-06-23 ENCOUNTER — Other Ambulatory Visit: Payer: Self-pay

## 2018-06-23 MED ORDER — PREDNISONE 10 MG (21) PO TBPK: ORAL_TABLET | ORAL | 0 refills | Status: AC

## 2018-06-23 NOTE — Telephone Encounter (Signed)
Pt called that still coughing,headache,little sob no chest pain sometimes coughing up mucus as per dr Humphrey Rolls send prednisone 6 days taper and advised to call us in 2 days or if worse go to ER

## 2018-06-24 ENCOUNTER — Other Ambulatory Visit: Payer: Self-pay | Admitting: Internal Medicine

## 2018-06-24 DIAGNOSIS — F410 Panic disorder [episodic paroxysmal anxiety] without agoraphobia: Secondary | ICD-10-CM

## 2018-06-24 DIAGNOSIS — F411 Generalized anxiety disorder: Principal | ICD-10-CM

## 2018-06-24 MED ORDER — DIAZEPAM 5 MG PO TABS: ORAL_TABLET | ORAL | 0 refills | Status: DC

## 2018-06-30 ENCOUNTER — Ambulatory Visit (INDEPENDENT_AMBULATORY_CARE_PROVIDER_SITE_OTHER): Payer: Medicaid Other | Admitting: Psychiatry

## 2018-06-30 ENCOUNTER — Other Ambulatory Visit: Payer: Self-pay

## 2018-06-30 DIAGNOSIS — Z5329 Procedure and treatment not carried out because of patient's decision for other reasons: Secondary | ICD-10-CM

## 2018-06-30 NOTE — Progress Notes (Signed)
Error

## 2018-06-30 NOTE — Progress Notes (Deleted)
Psychiatric Initial Adult Assessment   Patient Identification: Holly Espinoza MRN:  [id number] Date of Evaluation:  06/30/2018 Referral Source: *** Chief Complaint:   Visit Diagnosis: No diagnosis found.  History of Present Illness:  ***  Associated Signs/Symptoms: Depression Symptoms:  {DEPRESSION SYMPTOMS:20000} (Hypo) Manic Symptoms:  {BHH MANIC SYMPTOMS:22872} Anxiety Symptoms:  {BHH ANXIETY SYMPTOMS:22873} Psychotic Symptoms:  {BHH PSYCHOTIC SYMPTOMS:22874} PTSD Symptoms: {BHH PTSD SYMPTOMS:22875}  Past Psychiatric History: ***  Previous Psychotropic Medications: {YES/NO:21197}  Substance Abuse History in the last 12 months:  {yes no:314532}  Consequences of Substance Abuse: {BHH CONSEQUENCES OF SUBSTANCE ABUSE:22880}  Past Medical History:  Past Medical History:  Diagnosis Date  . Anxiety   . Arthritis    "pretty much from my neck to my feet" (07/08/2012)  . B12 deficiency anemia   . Chronic lower back pain   . Depression   . Diabetic peripheral neuropathy ([place])   . Gout   . High cholesterol   . 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 Neuromuscular disorder ([place])   . Rheumatoid arthritis ([place])   . Type II diabetes mellitus (HCC)    recent hgb A1C was 5.4 per Dr. Humphrey Rolls, [place], [place]    Past Surgical History:  Procedure Laterality Date  . ABDOMINAL HYSTERECTOMY  2001?  . ANTERIOR AND POSTERIOR REPAIR N/A 11/01/2015   Procedure: CYSTOSCOPY, ANTERIOR (CYSTOCELE) AND POSTERIOR REPAIR (RECTOCELE);  Surgeon: Bjorn Loser, MD;  Location: [facility];  Service: Urology;  Laterality: N/A;  . ANTERIOR CERVICAL DECOMP/DISCECTOMY FUSION  2012  . BACK SURGERY  2001   laminectomy  . BREAST BIOPSY Left 2014  . BREAST LUMPECTOMY Left 02/25/2013  . BREAST SURGERY Left 02/25/13   lumpectomy  . CHOLECYSTECTOMY  1980's  . DILATION AND CURETTAGE OF UTERUS  [phone]   "probably 3" (07/08/2012)  . FRACTURE SURGERY     C4-7 Surgery for fracture  .  LUMBAR WOUND DEBRIDEMENT N/A 08/12/2012   Procedure: LUMBAR WOUND DEBRIDEMENT;  Surgeon: Faythe Ghee, MD;  Location: [facility];  Service: Neurosurgery;  Laterality: N/A;  Lumbar Wound Debridement  . POSTERIOR LUMBAR FUSION  07/08/2012  . TONSILLECTOMY  ~ 1968    Family Psychiatric History: ***  Family History:  Family History  Problem Relation Age of Onset  . Kidney disease Father   . Cancer Other        breast great grandmother  . Breast cancer Other     Social History:   Social History   Socioeconomic History  . Marital status: Single    Spouse name: Not on file  . Number of children: Not on file  . Years of education: Not on file  . Highest education level: Not on file  Occupational History  . Not on file  Social Needs  . Financial resource strain: Not on file  . Food insecurity:    Worry: Not on file    Inability: Not on file  . Transportation needs:    Medical: Not on file    Non-medical: Not on file  Tobacco Use  . Smoking status: Current Every Day Smoker    Packs/day: 0.50    Years: 40.00    Pack years: 20.00    Types: Cigarettes  . Smokeless tobacco: Never Used  Substance and Sexual Activity  . Alcohol use: No  . Drug use: No    Types: Marijuana, Cocaine    Comment: 07/08/2012 "last drug use >8 yr ago" (07/08/2012)  . Sexual activity: Yes  Comment: quit a long time ago  Lifestyle  . Physical activity:    Days per week: Not on file    Minutes per session: Not on file  . Stress: Not on file  Relationships  . Social connections:    Talks on phone: Not on file    Gets together: Not on file    Attends religious service: Not on file    Active member of club or organization: Not on file    Attends meetings of clubs or organizations: Not on file    Relationship status: Not on file  Other Topics Concern  . Not on file  Social History Narrative  . Not on file    Additional Social History: ***  Allergies:  No Known Allergies  Metabolic  Disorder Labs: Lab Results  Component Value Date   HGBA1C 5.5 11/29/2017   No results found for: PROLACTIN No results found for: CHOL, TRIG, HDL, CHOLHDL, VLDL, LDLCALC No results found for: TSH  Therapeutic Level Labs: No results found for: LITHIUM No results found for: CBMZ No results found for: VALPROATE  Current Medications: Current Outpatient Medications  Medication Sig Dispense Refill  . allopurinol (ZYLOPRIM) 100 MG tablet Take 2 tab po at bedtime 30 tablet 3  . aspirin 81 MG chewable tablet Chew 81 mg by mouth daily.    . Colchicine (MITIGARE) 0.6 MG CAPS Take 1.2 mg by mouth See admin instructions. At onset of gout flare. May repeat one additional capsule 1 hour later if symptoms persist.    . Cyanocobalamin (B-12) 1000 MCG/ML KIT Inject 1,000 mcg as directed every 30 (thirty) days. 1 kit 3  . diazepam (VALIUM) 5 MG tablet Take half to one tab po qd prn only 30 tablet 0  . DULoxetine (CYMBALTA) 60 MG capsule Take 1 capsule (60 mg total) by mouth daily. 30 capsule 3  . fexofenadine (ALLEGRA) 180 MG tablet Take 180 mg by mouth daily.    . furosemide (LASIX) 20 MG tablet Take 1 tablet (20 mg total) by mouth daily as needed for fluid or edema. 30 tablet 3  . gemfibrozil (LOPID) 600 MG tablet Take 1 tablet (600 mg total) by mouth 2 (two) times daily before a meal. 60 tablet 3  . Insulin Pen Needle (PEN NEEDLES 5/16") 30G X 8 MM MISC To use every day with victoza injections - E11.65 30 each 5  . levothyroxine (SYNTHROID, LEVOTHROID) 75 MCG tablet Take 1 tablet (75 mcg total) by mouth daily before breakfast. 30 tablet 3  . liraglutide (VICTOZA) 18 MG/3ML SOPN Inject  1.8 mg [place]  daily 5 pen 3  . mirtazapine (REMERON) 15 MG tablet Take 1 tablet (15 mg total) by mouth at bedtime. 30 tablet 3  . mupirocin ointment (BACTROBAN) 2 % Place 1 application into the nose 2 (two) times daily. 22 g 0  . nystatin ointment (MYCOSTATIN) Apply 1 application topically 3 (three) times daily. 30 g 1  .  omeprazole (PRILOSEC) 40 MG capsule Take 1 capsule (40 mg total) by mouth daily. 30 capsule 3  . ondansetron (ZOFRAN) 4 MG tablet Take 4 mg by mouth every 8 (eight) hours as needed for nausea. For nausea    . pregabalin (LYRICA) 50 MG capsule Take 50 mg by mouth 2 (two) times daily.    Marland Kitchen sulfamethoxazole-trimethoprim (BACTRIM DS,SEPTRA DS) 800-160 MG tablet Take 1 tablet by mouth 2 (two) times daily. 20 tablet 0  . Syringe/Needle, Disp, (SYRINGE 3CC/20GX1") 20G X 1" 3 ML MISC  To sue for B12 injections once monthly 1 each 11  . topiramate (TOPAMAX) 100 MG tablet Take 100 mg by mouth 2 (two) times daily.     No current facility-administered medications for this visit.     Musculoskeletal: Strength & Muscle Tone: {desc; muscle tone:32375} Gait & Station: {PE GAIT ED WNUU:72536} Patient leans: {Patient Leans:21022755}  Psychiatric Specialty Exam: ROS  There were no vitals taken for this visit.There is no height or weight on file to calculate BMI.  General Appearance: {Appearance:22683}  Eye Contact:  {BHH EYE CONTACT:22684}  Speech:  {Speech:22685}  Volume:  {Volume (PAA):22686}  Mood:  {BHH MOOD:22306}  Affect:  {Affect (PAA):22687}  Thought Process:  {Thought Process (PAA):22688}  Orientation:  {BHH ORIENTATION (PAA):22689}  Thought Content:  {Thought Content:22690}  Suicidal Thoughts:  {ST/HT (PAA):22692}  Homicidal Thoughts:  {ST/HT (PAA):22692}  Memory:  {BHH MEMORY:22881}  Judgement:  {Judgement (PAA):22694}  Insight:  {Insight (PAA):22695}  Psychomotor Activity:  {Psychomotor (PAA):22696}  Concentration:  {Concentration:21399}  Recall:  {BHH GOOD/FAIR/POOR:22877}  Fund of Knowledge:{BHH GOOD/FAIR/POOR:22877}  Language: {BHH GOOD/FAIR/POOR:22877}  Akathisia:  {BHH YES OR NO:22294}  Handed:  {Handed:22697}  AIMS (if indicated):  {Desc; done/not:10129}  Assets:  {Assets (PAA):22698}  ADL's:  {BHH UYQ'I:34742}  Cognition: {chl bhh cognition:304700322}  Sleep:  {BHH  GOOD/FAIR/POOR:22877}   Screenings: PHQ2-9     Office Visit from 06/17/2018 in [facility], [facility] Office Visit from 04/08/2018 in [facility], [facility] Office Visit from 02/26/2018 in [facility], [facility], [facility] Office Visit from 11/29/2017 in [facility], [facility] Office Visit from 05/17/2017 in [facility], [facility]  PHQ-2 Total Score  0  0  0  0  0      Assessment and Plan: ***   Ursula Alert, MD 4/20/202012:28 PM

## 2018-07-15 ENCOUNTER — Telehealth: Payer: Self-pay | Admitting: Nurse Practitioner

## 2018-07-15 NOTE — Telephone Encounter (Signed)
Prior authorization for medication victoza has been approved, PA# [id number] Approval good through 07/09/2019 Pharmacy contacted

## 2018-07-24 ENCOUNTER — Other Ambulatory Visit: Payer: Self-pay

## 2018-07-24 ENCOUNTER — Encounter: Payer: Self-pay | Admitting: Nurse Practitioner

## 2018-07-24 ENCOUNTER — Ambulatory Visit: Payer: Medicaid Other | Admitting: Nurse Practitioner

## 2018-07-24 VITALS — BP 137/81 | HR 76 | Resp 16 | Ht 68.75 in | Wt 199.0 lb

## 2018-07-24 DIAGNOSIS — R0600 Dyspnea, unspecified: Secondary | ICD-10-CM

## 2018-07-24 DIAGNOSIS — F411 Generalized anxiety disorder: Secondary | ICD-10-CM

## 2018-07-24 DIAGNOSIS — R0609 Other forms of dyspnea: Secondary | ICD-10-CM | POA: Diagnosis not present

## 2018-07-24 DIAGNOSIS — I8312 Varicose veins of left lower extremity with inflammation: Secondary | ICD-10-CM | POA: Diagnosis not present

## 2018-07-24 DIAGNOSIS — R600 Localized edema: Secondary | ICD-10-CM | POA: Diagnosis not present

## 2018-07-24 DIAGNOSIS — E119 Type 2 diabetes mellitus without complications: Secondary | ICD-10-CM | POA: Diagnosis not present

## 2018-07-24 DIAGNOSIS — I83893 Varicose veins of bilateral lower extremities with other complications: Secondary | ICD-10-CM | POA: Insufficient documentation

## 2018-07-24 DIAGNOSIS — F410 Panic disorder [episodic paroxysmal anxiety] without agoraphobia: Secondary | ICD-10-CM

## 2018-07-24 DIAGNOSIS — R0602 Shortness of breath: Secondary | ICD-10-CM | POA: Insufficient documentation

## 2018-07-24 LAB — POCT GLYCOSYLATED HEMOGLOBIN (HGB A1C): Hemoglobin A1C: 5.6 % (ref 4.0–5.6)

## 2018-07-24 MED ORDER — DULOXETINE HCL 60 MG PO CPEP: 60.0000 mg | ORAL_CAPSULE | Freq: Every day | ORAL | 3 refills | Status: DC

## 2018-07-24 MED ORDER — DIAZEPAM 5 MG PO TABS: ORAL_TABLET | ORAL | 0 refills | Status: DC

## 2018-07-24 NOTE — Progress Notes (Signed)
[facility] [place], [place] [address]  Internal MEDICINE  Office Visit Note  Patient Name: Holly Espinoza  [id number]  [id number]  Date of Service: 07/24/2018  Chief Complaint  Patient presents with  . Medical Management of Chronic Issues    4 month follow up, pt has had some drainage issues and had to use qtip to get scab out of throat recently  . Edema    swelling in left leg and feet, started about 3wks ago, painful,, could feel the fluid moving, painful to touch  . Hyperlipidemia  . Diabetes    The patient is here for routine follow up visit. She was recently treated for infection in her nose. Had multiple sores inside her nose. She just finished second round of sulfa antibiotics and bactroban ointment. She states that she still has scabbing and post nasal drip and often feels blood and mucus draining down the back of her throat. Symptoms are gradually improving.  Blood sugars are doing very good. Her HgbA1c is 5.6 today. Blood pressure is well controlled. She states that she is having some swelling in both feet and ankles, worse on the left side. Gets worse as the day goes on. She does have significant varicose veins in both lower legs, they are more severe on the left side. Tender when palpated. The varicosities go all the way up to the upper, posterior aspect of the left leg. She also admits to some shortness of breath with exertion.       Current Medication: Outpatient Encounter Medications as of 07/24/2018  Medication Sig  . allopurinol (ZYLOPRIM) 100 MG tablet Take 2 tab po at bedtime  . aspirin 81 MG chewable tablet Chew 81 mg by mouth daily.  . Colchicine (MITIGARE) 0.6 MG CAPS Take 1.2 mg by mouth See admin instructions. At onset of gout flare. May repeat one additional capsule 1 hour later if symptoms persist.  . Cyanocobalamin (B-12) 1000 MCG/ML KIT Inject 1,000 mcg as directed every 30 (thirty) days.  . diazepam (VALIUM) 5 MG tablet Take half  to one tab po qd prn only  . DULoxetine (CYMBALTA) 60 MG capsule Take 1 capsule (60 mg total) by mouth daily.  . fexofenadine (ALLEGRA) 180 MG tablet Take 180 mg by mouth daily.  . furosemide (LASIX) 20 MG tablet Take 1 tablet (20 mg total) by mouth daily as needed for fluid or edema.  Marland Kitchen gemfibrozil (LOPID) 600 MG tablet Take 1 tablet (600 mg total) by mouth 2 (two) times daily before a meal.  . Insulin Pen Needle (PEN NEEDLES 5/16") 30G X 8 MM MISC To use every day with victoza injections - E11.65  . levothyroxine (SYNTHROID, LEVOTHROID) 75 MCG tablet Take 1 tablet (75 mcg total) by mouth daily before breakfast.  . liraglutide (VICTOZA) 18 MG/3ML SOPN Inject  1.8 mg [place]  daily  . mirtazapine (REMERON) 15 MG tablet Take 1 tablet (15 mg total) by mouth at bedtime.  . mupirocin ointment (BACTROBAN) 2 % Place 1 application into the nose 2 (two) times daily.  Marland Kitchen nystatin ointment (MYCOSTATIN) Apply 1 application topically 3 (three) times daily.  Marland Kitchen omeprazole (PRILOSEC) 40 MG capsule Take 1 capsule (40 mg total) by mouth daily.  . ondansetron (ZOFRAN) 4 MG tablet Take 4 mg by mouth every 8 (eight) hours as needed for nausea. For nausea  . pregabalin (LYRICA) 50 MG capsule Take 50 mg by mouth 2 (two) times daily.  . Syringe/Needle, Disp, (SYRINGE 3CC/20GX1") 20G  X 1" 3 ML MISC To sue for B12 injections once monthly  . topiramate (TOPAMAX) 100 MG tablet Take 100 mg by mouth 2 (two) times daily.  . [DISCONTINUED] diazepam (VALIUM) 5 MG tablet Take half to one tab po qd prn only  . [DISCONTINUED] DULoxetine (CYMBALTA) 60 MG capsule Take 1 capsule (60 mg total) by mouth daily.  . [DISCONTINUED] sulfamethoxazole-trimethoprim (BACTRIM DS,SEPTRA DS) 800-160 MG tablet Take 1 tablet by mouth 2 (two) times daily. (Patient not taking: Reported on 07/24/2018)   No facility-administered encounter medications on file as of 07/24/2018.     Surgical History: Past Surgical History:  Procedure Laterality Date  .  ABDOMINAL HYSTERECTOMY  2001?  . ANTERIOR AND POSTERIOR REPAIR N/A 11/01/2015   Procedure: CYSTOSCOPY, ANTERIOR (CYSTOCELE) AND POSTERIOR REPAIR (RECTOCELE);  Surgeon: Bjorn Loser, MD;  Location: [facility];  Service: Urology;  Laterality: N/A;  . ANTERIOR CERVICAL DECOMP/DISCECTOMY FUSION  2012  . BACK SURGERY  2001   laminectomy  . BREAST BIOPSY Left 2014  . BREAST LUMPECTOMY Left 02/25/2013  . BREAST SURGERY Left 02/25/13   lumpectomy  . CHOLECYSTECTOMY  1980's  . DILATION AND CURETTAGE OF UTERUS  [phone]   "probably 3" (07/08/2012)  . FRACTURE SURGERY     C4-7 Surgery for fracture  . LUMBAR WOUND DEBRIDEMENT N/A 08/12/2012   Procedure: LUMBAR WOUND DEBRIDEMENT;  Surgeon: Faythe Ghee, MD;  Location: [place];  Service: Neurosurgery;  Laterality: N/A;  Lumbar Wound Debridement  . POSTERIOR LUMBAR FUSION  07/08/2012  . TONSILLECTOMY  ~ 1968    Medical History: Past Medical History:  Diagnosis Date  . Anxiety   . Arthritis    "pretty much from my neck to my feet" (07/08/2012)  . B12 deficiency anemia   . Chronic lower back pain   . Depression   . Diabetic peripheral neuropathy ([place])   . Gout   . High cholesterol   . 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 Neuromuscular disorder ([place])   . Rheumatoid arthritis ([place])   . Type II diabetes mellitus (HCC)    recent hgb A1C was 5.4 per Dr. Humphrey Rolls, [place], [place]    Family History: Family History  Problem Relation Age of Onset  . Kidney disease Father   . Cancer Other        breast great grandmother  . Breast cancer Other     Social History   Socioeconomic History  . Marital status: Single    Spouse name: Not on file  . Number of children: Not on file  . Years of education: Not on file  . Highest education level: Not on file  Occupational History  . Not on file  Social Needs  . Financial resource strain: Not on file  . Food insecurity:    Worry: Not on file    Inability: Not on file   . Transportation needs:    Medical: Not on file    Non-medical: Not on file  Tobacco Use  . Smoking status: Current Every Day Smoker    Packs/day: 0.50    Years: 40.00    Pack years: 20.00    Types: Cigarettes  . Smokeless tobacco: Never Used  Substance and Sexual Activity  . Alcohol use: No  . Drug use: No    Types: Marijuana, Cocaine    Comment: 07/08/2012 "last drug use >8 yr ago" (07/08/2012)  . Sexual activity: Yes    Comment: quit a long time ago  Lifestyle  . Physical activity:    Days per week: Not on file    Minutes per session: Not on file  . Stress: Not on file  Relationships  . Social connections:    Talks on phone: Not on file    Gets together: Not on file    Attends religious service: Not on file    Active member of club or organization: Not on file    Attends meetings of clubs or organizations: Not on file    Relationship status: Not on file  . Intimate partner violence:    Fear of current or ex partner: Not on file    Emotionally abused: Not on file    Physically abused: Not on file    Forced sexual activity: Not on file  Other Topics Concern  . Not on file  Social History Narrative  . Not on file      Review of Systems  Constitutional: Positive for fatigue. Negative for appetite change, chills and fever.  HENT: Negative for congestion, ear pain, postnasal drip, rhinorrhea, sinus pain, sneezing, sore throat and voice change.   Respiratory: Positive for shortness of breath. Negative for cough and wheezing.        With exertion  Cardiovascular: Positive for leg swelling. Negative for chest pain and palpitations.       Signficant varicose veins of both legs, worse on the left side.   Gastrointestinal: Positive for nausea. Negative for vomiting.  Endocrine: Negative for cold intolerance, heat intolerance, polydipsia and polyuria.       Blood sugars doing well   Genitourinary: Negative for dysuria, flank pain, frequency, hematuria and urgency.   Musculoskeletal: Positive for arthralgias, back pain and myalgias.  Skin: Negative for rash.  Allergic/Immunologic: Positive for environmental allergies.  Neurological: Positive for weakness, numbness and headaches. Negative for dizziness.  Hematological: Negative for adenopathy.  Psychiatric/Behavioral: Positive for dysphoric mood. The patient is nervous/anxious.     Today's Vitals   07/24/18 1507  BP: 137/81  Pulse: 76  Resp: 16  SpO2: 97%  Weight: 199 lb (90.3 kg)  Height: 5' 8.75" (1.746 m)   Body mass index is 29.6 kg/m.  Physical Exam Vitals signs and nursing note reviewed.  Constitutional:      General: She is not in acute distress.    Appearance: Normal appearance. She is well-developed. She is not diaphoretic.  HENT:     Head: Normocephalic and atraumatic.     Mouth/Throat:     Pharynx: No oropharyngeal exudate.  Eyes:     Pupils: Pupils are equal, round, and reactive to light.  Neck:     Musculoskeletal: Normal range of motion and neck supple.     Thyroid: No thyromegaly.     Vascular: No JVD.     Trachea: No tracheal deviation.  Cardiovascular:     Rate and Rhythm: Normal rate and regular rhythm.     Heart sounds: Normal heart sounds. No murmur. No friction rub. No gallop.   Pulmonary:     Effort: Pulmonary effort is normal. No respiratory distress.     Breath sounds: Normal breath sounds. No wheezing or rales.  Chest:     Chest wall: No tenderness.  Abdominal:     General: Bowel sounds are normal.     Palpations: Abdomen is soft.  Genitourinary:    Comments: Urine sample showing large blood, large WBC and is positive for protein.  Musculoskeletal: Normal range of motion.  Lymphadenopathy:  Cervical: No cervical adenopathy.  Skin:    General: Skin is warm and dry.  Neurological:     Mental Status: She is alert and oriented to person, place, and time. Mental status is at baseline.     Cranial Nerves: No cranial nerve deficit.  Psychiatric:         Attention and Perception: Attention and perception normal.        Mood and Affect: Mood is anxious and depressed.        Speech: Speech normal.        Behavior: Behavior normal. Behavior is cooperative.        Thought Content: Thought content normal.        Cognition and Memory: Cognition and memory normal.        Judgment: Judgment normal.    Assessment/Plan: 1. Type 2 diabetes mellitus without complication, without long-term current use of insulin (HCC) - POCT HgB A1C 5.6 today. Continue diabetic medication as prescribed   2. Dyspnea on exertion - ECHOCARDIOGRAM COMPLETE; Future - will get echocardiogram for further evaluation.   3. Leg edema, left Use furosemide as prescribed   4. Varicose veins of left lower extremity with inflammation Will get venous ultrasound of left lower leg for further evaluation.  - VAS [place] LOWER EXTREMITY VENOUS REFLUX; Future  5. Generalized anxiety disorder with panic attacks Continue duloxetine every day. May take valium '5mg'$  twice daily if needed for acute anxiety/muscle spasticity. Refills were sent to her pharmacy.  - diazepam (VALIUM) 5 MG tablet; Take half to one tab po qd prn only  Dispense: 30 tablet; Refill: 0 - DULoxetine (CYMBALTA) 60 MG capsule; Take 1 capsule (60 mg total) by mouth daily.  Dispense: 30 capsule; Refill: 3  General Counseling: Katielynn verbalizes understanding of the findings of todays visit and agrees with plan of treatment. I have discussed any further diagnostic evaluation that may be needed or ordered today. We also reviewed her medications today. she has been encouraged to call the office with any questions or concerns that should arise related to todays visit.  Diabetes Counseling:  1. Addition of ACE inh/ ARB'S for nephroprotection. Microalbumin is updated  2. Diabetic foot care, prevention of complications. Podiatry consult 3. Exercise and lose weight.  4. Diabetic eye examination, Diabetic eye exam is updated  5.  Monitor blood sugar closlely. nutrition counseling.  6. Sign and symptoms of hypoglycemia including shaking sweating,confusion and headaches.   This patient was seen by Leretha Pol FNP Collaboration with Dr Lavera Guise as a part of collaborative care agreement  Orders Placed This Encounter  Procedures  . POCT HgB A1C  . ECHOCARDIOGRAM COMPLETE    Meds ordered this encounter  Medications  . diazepam (VALIUM) 5 MG tablet    Sig: Take half to one tab po qd prn only    Dispense:  30 tablet    Refill:  0    Take 1 tab po bid prn for muscle pain and tightness.    Order Specific Question:   Supervising Provider    Answer:   Lavera Guise [[id number]]  . DULoxetine (CYMBALTA) 60 MG capsule    Sig: Take 1 capsule (60 mg total) by mouth daily.    Dispense:  30 capsule    Refill:  3    Order Specific Question:   Supervising Provider    Answer:   Lavera Guise [[id number]]    Time spent: 68 Minutes      Dr Latricia Heft  [facility]

## 2018-08-01 ENCOUNTER — Ambulatory Visit: Payer: Self-pay | Admitting: Nurse Practitioner

## 2018-08-15 ENCOUNTER — Other Ambulatory Visit: Payer: Self-pay

## 2018-08-15 ENCOUNTER — Ambulatory Visit: Payer: Medicaid Other

## 2018-08-15 DIAGNOSIS — I8310 Varicose veins of unspecified lower extremity with inflammation: Secondary | ICD-10-CM | POA: Diagnosis not present

## 2018-08-15 DIAGNOSIS — I8312 Varicose veins of left lower extremity with inflammation: Secondary | ICD-10-CM

## 2018-08-22 ENCOUNTER — Ambulatory Visit: Payer: Medicaid Other

## 2018-08-22 ENCOUNTER — Other Ambulatory Visit: Payer: Self-pay

## 2018-08-22 DIAGNOSIS — R0609 Other forms of dyspnea: Secondary | ICD-10-CM

## 2018-08-22 DIAGNOSIS — R0600 Dyspnea, unspecified: Secondary | ICD-10-CM

## 2018-08-26 ENCOUNTER — Other Ambulatory Visit: Payer: Self-pay

## 2018-08-26 DIAGNOSIS — B379 Candidiasis, unspecified: Secondary | ICD-10-CM

## 2018-08-26 MED ORDER — FLUCONAZOLE 150 MG PO TABS: 150.0000 mg | ORAL_TABLET | Freq: Every day | ORAL | 0 refills | Status: DC

## 2018-08-28 ENCOUNTER — Ambulatory Visit: Payer: Medicaid Other | Admitting: Nurse Practitioner

## 2018-08-28 ENCOUNTER — Encounter: Payer: Self-pay | Admitting: Nurse Practitioner

## 2018-08-28 ENCOUNTER — Other Ambulatory Visit: Payer: Self-pay

## 2018-08-28 VITALS — BP 129/67 | HR 77 | Resp 16 | Ht 68.75 in | Wt 199.2 lb

## 2018-08-28 DIAGNOSIS — M25511 Pain in right shoulder: Secondary | ICD-10-CM

## 2018-08-28 DIAGNOSIS — I8312 Varicose veins of left lower extremity with inflammation: Secondary | ICD-10-CM | POA: Diagnosis not present

## 2018-08-28 DIAGNOSIS — F411 Generalized anxiety disorder: Secondary | ICD-10-CM

## 2018-08-28 DIAGNOSIS — R0609 Other forms of dyspnea: Secondary | ICD-10-CM | POA: Diagnosis not present

## 2018-08-28 DIAGNOSIS — G8929 Other chronic pain: Secondary | ICD-10-CM | POA: Insufficient documentation

## 2018-08-28 DIAGNOSIS — M7022 Olecranon bursitis, left elbow: Secondary | ICD-10-CM

## 2018-08-28 DIAGNOSIS — M542 Cervicalgia: Secondary | ICD-10-CM

## 2018-08-28 DIAGNOSIS — M25512 Pain in left shoulder: Secondary | ICD-10-CM

## 2018-08-28 DIAGNOSIS — M1A9XX0 Chronic gout, unspecified, without tophus (tophi): Secondary | ICD-10-CM

## 2018-08-28 DIAGNOSIS — M7021 Olecranon bursitis, right elbow: Secondary | ICD-10-CM | POA: Diagnosis not present

## 2018-08-28 DIAGNOSIS — R0600 Dyspnea, unspecified: Secondary | ICD-10-CM

## 2018-08-28 DIAGNOSIS — F410 Panic disorder [episodic paroxysmal anxiety] without agoraphobia: Secondary | ICD-10-CM

## 2018-08-28 MED ORDER — ALLOPURINOL 100 MG PO TABS: ORAL_TABLET | ORAL | 3 refills | Status: DC

## 2018-08-28 MED ORDER — DIAZEPAM 5 MG PO TABS: ORAL_TABLET | ORAL | 2 refills | Status: DC

## 2018-08-28 NOTE — Progress Notes (Signed)
[facility] [place], [place] [address]  Internal MEDICINE  Office Visit Note  Patient Name: Holly Espinoza  [id number]  [id number]  Date of Service: 08/28/2018  Chief Complaint  Patient presents with  . Medical Management of Chronic Issues    follow up  . Labs Only    ultrasound and echo results  . Vaginal Itching    pt have a yeast infection she is currently taking medication for it, stated that it was getting better a little bit    The patient is here for routine follow up visit. She states that she is having some swelling in both feet and ankles, worse on the left side. Gets worse as the day goes on. She does have significant varicose veins in both lower legs, they are more severe on the left side. Tender when palpated. The varicosities go all the way up to the upper, posterior aspect of the left leg. She also admits to some shortness of breath with exertion. She did have ultrasound of the lower extremity to evaluate for clots and for venous reflux. There is no evidence of DVT or Baker's cyst. Did show mild venous reflux. She also had echocardiogram done. She has normal LVG with diastolic dysfunction. There is trace valvular regurgitation.  The patient is complaining of bilateral shoulder pain, worse on the right than left. Has seen orthopedics in the past and got cortisone injections into the shoulder which helped a lot. She would like to see them again.        Current Medication: Outpatient Encounter Medications as of 08/28/2018  Medication Sig  . allopurinol (ZYLOPRIM) 100 MG tablet Take 2 tab po at bedtime  . aspirin 81 MG chewable tablet Chew 81 mg by mouth daily.  . Colchicine (MITIGARE) 0.6 MG CAPS Take 1.2 mg by mouth See admin instructions. At onset of gout flare. May repeat one additional capsule 1 hour later if symptoms persist.  . Cyanocobalamin (B-12) 1000 MCG/ML KIT Inject 1,000 mcg as directed every 30 (thirty) days.  . diazepam (VALIUM) 5  MG tablet Take 1/2 to 1 tablet po BID PRN only.  . DULoxetine (CYMBALTA) 60 MG capsule Take 1 capsule (60 mg total) by mouth daily.  . fexofenadine (ALLEGRA) 180 MG tablet Take 180 mg by mouth daily.  . fluconazole (DIFLUCAN) 150 MG tablet Take 1 tablet (150 mg total) by mouth daily. Take one tablet once and may repeat if symptoms persist in 3 days  . furosemide (LASIX) 20 MG tablet Take 1 tablet (20 mg total) by mouth daily as needed for fluid or edema.  Marland Kitchen gemfibrozil (LOPID) 600 MG tablet Take 1 tablet (600 mg total) by mouth 2 (two) times daily before a meal.  . Insulin Pen Needle (PEN NEEDLES 5/16") 30G X 8 MM MISC To use every day with victoza injections - E11.65  . levothyroxine (SYNTHROID, LEVOTHROID) 75 MCG tablet Take 1 tablet (75 mcg total) by mouth daily before breakfast.  . liraglutide (VICTOZA) 18 MG/3ML SOPN Inject  1.8 mg [place]  daily  . mirtazapine (REMERON) 15 MG tablet Take 1 tablet (15 mg total) by mouth at bedtime.  . mupirocin ointment (BACTROBAN) 2 % Place 1 application into the nose 2 (two) times daily.  Marland Kitchen nystatin ointment (MYCOSTATIN) Apply 1 application topically 3 (three) times daily.  Marland Kitchen omeprazole (PRILOSEC) 40 MG capsule Take 1 capsule (40 mg total) by mouth daily.  . ondansetron (ZOFRAN) 4 MG tablet Take 4 mg by mouth  every 8 (eight) hours as needed for nausea. For nausea  . pregabalin (LYRICA) 50 MG capsule Take 50 mg by mouth 2 (two) times daily.  . Syringe/Needle, Disp, (SYRINGE 3CC/20GX1") 20G X 1" 3 ML MISC To sue for B12 injections once monthly  . topiramate (TOPAMAX) 100 MG tablet Take 100 mg by mouth 2 (two) times daily.  . [DISCONTINUED] allopurinol (ZYLOPRIM) 100 MG tablet Take 2 tab po at bedtime  . [DISCONTINUED] diazepam (VALIUM) 5 MG tablet Take half to one tab po qd prn only   No facility-administered encounter medications on file as of 08/28/2018.     Surgical History: Past Surgical History:  Procedure Laterality Date  . ABDOMINAL HYSTERECTOMY   2001?  . ANTERIOR AND POSTERIOR REPAIR N/A 11/01/2015   Procedure: CYSTOSCOPY, ANTERIOR (CYSTOCELE) AND POSTERIOR REPAIR (RECTOCELE);  Surgeon: Bjorn Loser, MD;  Location: [facility];  Service: Urology;  Laterality: N/A;  . ANTERIOR CERVICAL DECOMP/DISCECTOMY FUSION  2012  . BACK SURGERY  2001   laminectomy  . BREAST BIOPSY Left 2014  . BREAST LUMPECTOMY Left 02/25/2013  . BREAST SURGERY Left 02/25/13   lumpectomy  . CHOLECYSTECTOMY  1980's  . DILATION AND CURETTAGE OF UTERUS  [phone]   "probably 3" (07/08/2012)  . FRACTURE SURGERY     C4-7 Surgery for fracture  . LUMBAR WOUND DEBRIDEMENT N/A 08/12/2012   Procedure: LUMBAR WOUND DEBRIDEMENT;  Surgeon: Faythe Ghee, MD;  Location: [facility];  Service: Neurosurgery;  Laterality: N/A;  Lumbar Wound Debridement  . POSTERIOR LUMBAR FUSION  07/08/2012  . TONSILLECTOMY  ~ 1968    Medical History: Past Medical History:  Diagnosis Date  . Anxiety   . Arthritis    "pretty much from my neck to my feet" (07/08/2012)  . B12 deficiency anemia   . Chronic lower back pain   . Depression   . Diabetic peripheral neuropathy ([place])   . Gout   . High cholesterol   . 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 Neuromuscular disorder ([place])   . Rheumatoid arthritis ([place])   . Type II diabetes mellitus (HCC)    recent hgb A1C was 5.4 per Dr. Humphrey Rolls, [place], [place]    Family History: Family History  Problem Relation Age of Onset  . Kidney disease Father   . Cancer Other        breast great grandmother  . Breast cancer Other     Social History   Socioeconomic History  . Marital status: Single    Spouse name: Not on file  . Number of children: Not on file  . Years of education: Not on file  . Highest education level: Not on file  Occupational History  . Not on file  Social Needs  . Financial resource strain: Not on file  . Food insecurity    Worry: Not on file    Inability: Not on file  . Transportation needs     Medical: Not on file    Non-medical: Not on file  Tobacco Use  . Smoking status: Current Every Day Smoker    Packs/day: 0.50    Years: 40.00    Pack years: 20.00    Types: Cigarettes  . Smokeless tobacco: Never Used  Substance and Sexual Activity  . Alcohol use: No  . Drug use: No    Types: Marijuana, Cocaine    Comment: 07/08/2012 "last drug use >8 yr ago" (07/08/2012)  . Sexual activity: Yes    Comment: quit  a long time ago  Lifestyle  . Physical activity    Days per week: Not on file    Minutes per session: Not on file  . Stress: Not on file  Relationships  . Social Herbalist on phone: Not on file    Gets together: Not on file    Attends religious service: Not on file    Active member of club or organization: Not on file    Attends meetings of clubs or organizations: Not on file    Relationship status: Not on file  . Intimate partner violence    Fear of current or ex partner: Not on file    Emotionally abused: Not on file    Physically abused: Not on file    Forced sexual activity: Not on file  Other Topics Concern  . Not on file  Social History Narrative  . Not on file      Review of Systems  Constitutional: Positive for fatigue. Negative for appetite change, chills and fever.  HENT: Negative for congestion, ear pain, postnasal drip, rhinorrhea, sinus pain, sneezing, sore throat and voice change.   Respiratory: Positive for shortness of breath. Negative for cough and wheezing.        With exertion  Cardiovascular: Positive for leg swelling. Negative for chest pain and palpitations.       Signficant varicose veins of both legs, worse on the left side.   Gastrointestinal: Negative for nausea and vomiting.  Endocrine: Negative for cold intolerance, heat intolerance, polydipsia and polyuria.       Blood sugars doing well   Genitourinary: Negative for dysuria, flank pain, frequency, hematuria and urgency.  Musculoskeletal: Positive for arthralgias,  back pain, myalgias and neck pain.       Bilateral shoulder pain, worse on the right than the left .  Skin: Negative for rash.  Allergic/Immunologic: Positive for environmental allergies.  Neurological: Positive for weakness, numbness and headaches. Negative for dizziness.  Hematological: Negative for adenopathy.  Psychiatric/Behavioral: Positive for dysphoric mood. The patient is nervous/anxious.     Today's Vitals   08/28/18 1442  BP: 129/67  Pulse: 77  Resp: 16  SpO2: 96%  Weight: 199 lb 3.2 oz (90.4 kg)  Height: 5' 8.75" (1.746 m)   Body mass index is 29.63 kg/m.  Physical Exam Vitals signs and nursing note reviewed.  Constitutional:      General: She is not in acute distress.    Appearance: Normal appearance. She is well-developed. She is not diaphoretic.  HENT:     Head: Normocephalic and atraumatic.     Mouth/Throat:     Pharynx: No oropharyngeal exudate.  Eyes:     Pupils: Pupils are equal, round, and reactive to light.  Neck:     Musculoskeletal: Neck supple. Decreased range of motion. Torticollis present.     Thyroid: No thyromegaly.     Vascular: No JVD.     Trachea: No tracheal deviation.  Cardiovascular:     Rate and Rhythm: Normal rate and regular rhythm.     Heart sounds: Normal heart sounds. No murmur. No friction rub. No gallop.   Pulmonary:     Effort: Pulmonary effort is normal. No respiratory distress.     Breath sounds: Normal breath sounds. No wheezing or rales.  Chest:     Chest wall: No tenderness.  Genitourinary:    Comments: Urine sample showing large blood, large WBC and is positive for protein.  Musculoskeletal:  Comments: Bilateral shoulder pain, worse on right than left. ROM and strength is mildly decreased. No palpable abnormalities or deformities noted.  Lymphadenopathy:     Cervical: No cervical adenopathy.  Skin:    General: Skin is warm and dry.  Neurological:     Mental Status: She is alert and oriented to person, place, and  time. Mental status is at baseline.     Cranial Nerves: No cranial nerve deficit.  Psychiatric:        Attention and Perception: Attention and perception normal.        Mood and Affect: Mood is anxious and depressed.        Speech: Speech normal.        Behavior: Behavior normal. Behavior is cooperative.        Thought Content: Thought content normal.        Cognition and Memory: Cognition and memory normal.        Judgment: Judgment normal.    Assessment/Plan: 1. Dyspnea on exertion Reviewed results of echocardiogram. Normal LVF with diastolic dysfunction. Good blood pressure control. Will continue to monitor.   2. Varicose veins of left lower extremity with inflammation Mild venous reflux of left lower extremity without clot or Baker's cyst. Advised compression socks, increased water, and propping the legs up when possible. Monitor closely  3. Olecranon bursitis of both elbows Refer to orthopedics for further evaluation and treatment.  - Ambulatory referral to Orthopedic Surgery  4. Chronic pain of both shoulders Refer to orthopedics for further evaluation and treatment.  - Ambulatory referral to Orthopedic Surgery  5. Generalized anxiety disorder with panic attacks May take diazepam 60m, on half to one tablet twice daily as needed. Prescription for #45 tablets sent to her [[facility]  - diazepam (VALIUM) 5 MG tablet; Take 1/2 to 1 tablet po BID PRN only.  Dispense: 45 tablet; Refill: 2  6. Cervicalgia May take diazepam 576m on half to one tablet twice daily as needed. [facility]) 5 MG tablet; Take 1/2 to 1 tablet po BID PRN only.  Dispense: 45 tablet; Refill: 2  7. Chronic gout without tophus, unspecified cause, unspecified site - allopurinol (ZYLOPRIM) 100 MG tablet; Take 2 tab po at bedtime  Dispense: 30 tablet; Refill: 3  General Counseling: CaDamekaerbalizes understanding of the findings of todays visit and agrees  with plan of treatment. I have discussed any further diagnostic evaluation that may be needed or ordered today. We also reviewed her medications today. she has been encouraged to call the office with any questions or concerns that should arise related to todays visit.   This patient was seen by HeLeretha PolNP Collaboration with Dr FoLavera Guises a part of collaborative care agreement  Orders Placed This Encounter  Procedures  . Ambulatory referral to Orthopedic Surgery    Meds ordered this encounter  Medications  . diazepam (VALIUM) 5 MG tablet    Sig: Take 1/2 to 1 tablet po BID PRN only.    Dispense:  45 tablet    Refill:  2    Take 1 tab po bid prn for muscle pain and tightness.    Order Specific Question:   Supervising Provider    Answer:   KHLavera Guise1[[id number]]. allopurinol (ZYLOPRIM) 100 MG tablet    Sig: Take 2 tab po at bedtime    Dispense:  30 tablet    Refill:  3  Order Specific Question:   Supervising Provider    Answer:   Lavera Guise [[id number]]    Time spent: 46 Minutes      Dr Lavera Guise Internal medicine

## 2018-09-03 ENCOUNTER — Other Ambulatory Visit: Payer: Self-pay

## 2018-09-03 DIAGNOSIS — E1165 Type 2 diabetes mellitus with hyperglycemia: Secondary | ICD-10-CM

## 2018-09-03 MED ORDER — LIRAGLUTIDE 18 MG/3ML ~~LOC~~ SOPN: PEN_INJECTOR | SUBCUTANEOUS | 3 refills | Status: DC

## 2018-09-25 ENCOUNTER — Other Ambulatory Visit: Payer: Self-pay

## 2018-09-25 DIAGNOSIS — E039 Hypothyroidism, unspecified: Secondary | ICD-10-CM

## 2018-09-25 DIAGNOSIS — E785 Hyperlipidemia, unspecified: Secondary | ICD-10-CM

## 2018-09-25 MED ORDER — GEMFIBROZIL 600 MG PO TABS: 600.0000 mg | ORAL_TABLET | Freq: Two times a day (BID) | ORAL | 3 refills | Status: DC

## 2018-09-25 MED ORDER — LEVOTHYROXINE SODIUM 75 MCG PO TABS: 75.0000 ug | ORAL_TABLET | Freq: Every day | ORAL | 3 refills | Status: DC

## 2018-10-02 ENCOUNTER — Other Ambulatory Visit: Payer: Self-pay

## 2018-10-02 DIAGNOSIS — R609 Edema, unspecified: Secondary | ICD-10-CM

## 2018-10-02 MED ORDER — FUROSEMIDE 20 MG PO TABS: 20.0000 mg | ORAL_TABLET | Freq: Every day | ORAL | 3 refills | Status: DC | PRN

## 2018-11-14 ENCOUNTER — Other Ambulatory Visit: Payer: Self-pay

## 2018-11-14 DIAGNOSIS — M1A9XX0 Chronic gout, unspecified, without tophus (tophi): Secondary | ICD-10-CM

## 2018-11-14 MED ORDER — ALLOPURINOL 100 MG PO TABS: ORAL_TABLET | ORAL | 3 refills | Status: DC

## 2018-11-19 ENCOUNTER — Other Ambulatory Visit: Payer: Self-pay

## 2018-11-19 DIAGNOSIS — E039 Hypothyroidism, unspecified: Secondary | ICD-10-CM

## 2018-11-19 MED ORDER — LEVOTHYROXINE SODIUM 75 MCG PO TABS: 75.0000 ug | ORAL_TABLET | Freq: Every day | ORAL | 3 refills | Status: DC

## 2018-11-28 ENCOUNTER — Other Ambulatory Visit: Payer: Self-pay

## 2018-11-28 ENCOUNTER — Encounter: Payer: Self-pay | Admitting: Nurse Practitioner

## 2018-11-28 ENCOUNTER — Ambulatory Visit (INDEPENDENT_AMBULATORY_CARE_PROVIDER_SITE_OTHER): Payer: Medicaid Other | Admitting: Nurse Practitioner

## 2018-11-28 VITALS — BP 149/79 | HR 75 | Temp 97.6°F | Resp 16 | Ht 68.0 in | Wt 199.0 lb

## 2018-11-28 DIAGNOSIS — F411 Generalized anxiety disorder: Secondary | ICD-10-CM | POA: Diagnosis not present

## 2018-11-28 DIAGNOSIS — M542 Cervicalgia: Secondary | ICD-10-CM | POA: Diagnosis not present

## 2018-11-28 DIAGNOSIS — M25511 Pain in right shoulder: Secondary | ICD-10-CM | POA: Diagnosis not present

## 2018-11-28 DIAGNOSIS — E1165 Type 2 diabetes mellitus with hyperglycemia: Secondary | ICD-10-CM

## 2018-11-28 DIAGNOSIS — M25512 Pain in left shoulder: Secondary | ICD-10-CM

## 2018-11-28 DIAGNOSIS — G8929 Other chronic pain: Secondary | ICD-10-CM

## 2018-11-28 DIAGNOSIS — F410 Panic disorder [episodic paroxysmal anxiety] without agoraphobia: Secondary | ICD-10-CM

## 2018-11-28 LAB — POCT GLYCOSYLATED HEMOGLOBIN (HGB A1C): Hemoglobin A1C: 5.7 % — AB (ref 4.0–5.6)

## 2018-11-28 MED ORDER — MELOXICAM 7.5 MG PO TABS: 7.5000 mg | ORAL_TABLET | Freq: Two times a day (BID) | ORAL | 3 refills | Status: DC | PRN

## 2018-11-28 MED ORDER — DIAZEPAM 5 MG PO TABS: ORAL_TABLET | ORAL | 2 refills | Status: DC

## 2018-11-28 NOTE — Progress Notes (Signed)
[facility] [place], [place] [address]  Internal MEDICINE  Office Visit Note  Patient Name: Holly Espinoza  [id number]  [id number]  Date of Service: 12/07/2018  Chief Complaint  Patient presents with  . Diabetes  . Hypertension  . Depression  . Anxiety  . Arthritis    The patient is here for routine follow up of diabetes. Her HgbA1c is 5.7 today. She states that she is doing well for the most part. Blood pressure is well managed. She does have right shoulder pain. She is currently seeing orthopedic provider. She had some physical therapy which has not helped. Unable to have surgery on her shoulder at this time. She also sees pain management for pain control in her spine. I do fill prescription for diazepam to take as needed for muscle spasms and anxiety. She does need to have refills for this today.       Current Medication: Outpatient Encounter Medications as of 11/28/2018  Medication Sig  . allopurinol (ZYLOPRIM) 100 MG tablet Take 2 tab po at bedtime  . aspirin 81 MG chewable tablet Chew 81 mg by mouth daily.  . Colchicine (MITIGARE) 0.6 MG CAPS Take 1.2 mg by mouth See admin instructions. At onset of gout flare. May repeat one additional capsule 1 hour later if symptoms persist.  . Cyanocobalamin (B-12) 1000 MCG/ML KIT Inject 1,000 mcg as directed every 30 (thirty) days.  . diazepam (VALIUM) 5 MG tablet Take 1/2 to 1 tablet po BID PRN only.  . DULoxetine (CYMBALTA) 60 MG capsule Take 1 capsule (60 mg total) by mouth daily.  . fexofenadine (ALLEGRA) 180 MG tablet Take 180 mg by mouth daily.  . fluconazole (DIFLUCAN) 150 MG tablet Take 1 tablet (150 mg total) by mouth daily. Take one tablet once and may repeat if symptoms persist in 3 days  . furosemide (LASIX) 20 MG tablet Take 1 tablet (20 mg total) by mouth daily as needed for fluid or edema.  Marland Kitchen gemfibrozil (LOPID) 600 MG tablet Take 1 tablet (600 mg total) by mouth 2 (two) times daily before a meal.   . Insulin Pen Needle (PEN NEEDLES 5/16") 30G X 8 MM MISC To use every day with victoza injections - E11.65  . levothyroxine (SYNTHROID) 75 MCG tablet Take 1 tablet (75 mcg total) by mouth daily before breakfast.  . liraglutide (VICTOZA) 18 MG/3ML SOPN Inject  1.8 mg [place]  daily  . mirtazapine (REMERON) 15 MG tablet Take 1 tablet (15 mg total) by mouth at bedtime.  . mupirocin ointment (BACTROBAN) 2 % Place 1 application into the nose 2 (two) times daily.  Marland Kitchen nystatin ointment (MYCOSTATIN) Apply 1 application topically 3 (three) times daily.  Marland Kitchen omeprazole (PRILOSEC) 40 MG capsule Take 1 capsule (40 mg total) by mouth daily.  . ondansetron (ZOFRAN) 4 MG tablet Take 4 mg by mouth every 8 (eight) hours as needed for nausea. For nausea  . pregabalin (LYRICA) 50 MG capsule Take 50 mg by mouth 2 (two) times daily.  . Syringe/Needle, Disp, (SYRINGE 3CC/20GX1") 20G X 1" 3 ML MISC To sue for B12 injections once monthly  . topiramate (TOPAMAX) 100 MG tablet Take 100 mg by mouth 2 (two) times daily.  . [DISCONTINUED] diazepam (VALIUM) 5 MG tablet Take 1/2 to 1 tablet po BID PRN only.  . meloxicam (MOBIC) 7.5 MG tablet Take 1 tablet (7.5 mg total) by mouth 2 (two) times daily as needed for pain.   No facility-administered encounter medications on  file as of 11/28/2018.     Surgical History: Past Surgical History:  Procedure Laterality Date  . ABDOMINAL HYSTERECTOMY  2001?  . ANTERIOR AND POSTERIOR REPAIR N/A 11/01/2015   Procedure: CYSTOSCOPY, ANTERIOR (CYSTOCELE) AND POSTERIOR REPAIR (RECTOCELE);  Surgeon: Bjorn Loser, MD;  Location: [facility];  Service: Urology;  Laterality: N/A;  . ANTERIOR CERVICAL DECOMP/DISCECTOMY FUSION  2012  . BACK SURGERY  2001   laminectomy  . BREAST BIOPSY Left 2014  . BREAST LUMPECTOMY Left 02/25/2013  . BREAST SURGERY Left 02/25/13   lumpectomy  . CHOLECYSTECTOMY  1980's  . DILATION AND CURETTAGE OF UTERUS  [phone]   "probably 3" (07/08/2012)  . FRACTURE SURGERY      C4-7 Surgery for fracture  . LUMBAR WOUND DEBRIDEMENT N/A 08/12/2012   Procedure: LUMBAR WOUND DEBRIDEMENT;  Surgeon: Faythe Ghee, MD;  Location: [facility];  Service: Neurosurgery;  Laterality: N/A;  Lumbar Wound Debridement  . POSTERIOR LUMBAR FUSION  07/08/2012  . TONSILLECTOMY  ~ 1968    Medical History: Past Medical History:  Diagnosis Date  . Anxiety   . Arthritis    "pretty much from my neck to my feet" (07/08/2012)  . B12 deficiency anemia   . Chronic lower back pain   . Depression   . Diabetic peripheral neuropathy ([place])   . Gout   . High cholesterol   . 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 Neuromuscular disorder ([place])   . Rheumatoid arthritis ([place])   . Type II diabetes mellitus (HCC)    recent hgb A1C was 5.4 per Dr. Humphrey Rolls, [place], [place]    Family History: Family History  Problem Relation Age of Onset  . Kidney disease Father   . Cancer Other        breast great grandmother  . Breast cancer Other     Social History   Socioeconomic History  . Marital status: Single    Spouse name: Not on file  . Number of children: Not on file  . Years of education: Not on file  . Highest education level: Not on file  Occupational History  . Not on file  Social Needs  . Financial resource strain: Not on file  . Food insecurity    Worry: Not on file    Inability: Not on file  . Transportation needs    Medical: Not on file    Non-medical: Not on file  Tobacco Use  . Smoking status: Current Every Day Smoker    Packs/day: 0.50    Years: 40.00    Pack years: 20.00    Types: Cigarettes  . Smokeless tobacco: Never Used  Substance and Sexual Activity  . Alcohol use: No  . Drug use: No    Types: Marijuana, Cocaine    Comment: 07/08/2012 "last drug use >8 yr ago" (07/08/2012)  . Sexual activity: Yes    Comment: quit a long time ago  Lifestyle  . Physical activity    Days per week: Not on file    Minutes per session: Not on file  .  Stress: Not on file  Relationships  . Social Herbalist on phone: Not on file    Gets together: Not on file    Attends religious service: Not on file    Active member of club or organization: Not on file    Attends meetings of clubs or organizations: Not on file    Relationship status: Not on file  .  Intimate partner violence    Fear of current or ex partner: Not on file    Emotionally abused: Not on file    Physically abused: Not on file    Forced sexual activity: Not on file  Other Topics Concern  . Not on file  Social History Narrative  . Not on file      Review of Systems  Constitutional: Positive for fatigue. Negative for appetite change, chills and fever.  HENT: Negative for congestion, ear pain, postnasal drip, rhinorrhea, sinus pain, sneezing, sore throat and voice change.   Respiratory: Positive for shortness of breath. Negative for cough and wheezing.        With exertion  Cardiovascular: Negative for chest pain and palpitations.  Gastrointestinal: Negative for nausea and vomiting.  Endocrine: Negative for cold intolerance, heat intolerance, polydipsia and polyuria.       Blood sugars doing well   Musculoskeletal: Positive for arthralgias, back pain, myalgias and neck pain.       Bilateral shoulder pain, worse on the right than the left .  Skin: Negative for rash.  Allergic/Immunologic: Positive for environmental allergies.  Neurological: Positive for weakness, numbness and headaches. Negative for dizziness.  Hematological: Negative for adenopathy.  Psychiatric/Behavioral: Positive for dysphoric mood. The patient is nervous/anxious.     Today's Vitals   11/28/18 1445  BP: (!) 149/79  Pulse: 75  Resp: 16  Temp: 97.6 F (36.4 C)  SpO2: 99%  Height: _0  (1.727 m)   Body mass index is 30.29 kg/m.  Physical Exam Vitals signs and nursing note reviewed.  Constitutional:      General: She is not in acute distress.    Appearance: Normal  appearance. She is well-developed. She is not diaphoretic.  HENT:     Head: Normocephalic and atraumatic.     Mouth/Throat:     Pharynx: No oropharyngeal exudate.  Eyes:     Pupils: Pupils are equal, round, and reactive to light.  Neck:     Musculoskeletal: Neck supple. Decreased range of motion. Torticollis and muscular tenderness present.     Thyroid: No thyromegaly.     Vascular: No JVD.     Trachea: No tracheal deviation.  Cardiovascular:     Rate and Rhythm: Normal rate and regular rhythm.     Heart sounds: Normal heart sounds. No murmur. No friction rub. No gallop.   Pulmonary:     Effort: Pulmonary effort is normal. No respiratory distress.     Breath sounds: Normal breath sounds. No wheezing or rales.  Chest:     Chest wall: No tenderness.  Musculoskeletal:     Comments: Bilateral shoulder pain, worse on right than left. ROM and strength is mildly decreased. No palpable abnormalities or deformities noted.  Lymphadenopathy:     Cervical: No cervical adenopathy.  Skin:    General: Skin is warm and dry.  Neurological:     Mental Status: She is alert and oriented to person, place, and time. Mental status is at baseline.     Cranial Nerves: No cranial nerve deficit.  Psychiatric:        Attention and Perception: Attention and perception normal.        Mood and Affect: Mood is anxious and depressed.        Speech: Speech normal.        Behavior: Behavior normal. Behavior is cooperative.        Thought Content: Thought content normal.  Cognition and Memory: Cognition and memory normal.        Judgment: Judgment normal.    Assessment/Plan: 1. Type 2 diabetes mellitus with hyperglycemia, unspecified whether long term insulin use (HCC) - POCT HgB A1C 5.7 today. Continue diabetic medications as prescribed   2. Chronic pain of both shoulders May take meloxicam 7.2m twice daily to relieve pain and infiammation.  - meloxicam (MOBIC) 7.5 MG tablet; Take 1 tablet (7.5 mg  total) by mouth 2 (two) times daily as needed for pain.  Dispense: 60 tablet; Refill: 3  3. Cervicalgia Will continue valium 593mup to twice daily as needed for muscle pain and spasms. New prescription sent to her pharmacy today.  - diazepam (VALIUM) 5 MG tablet; Take 1/2 to 1 tablet po BID PRN only.  Dispense: 45 tablet; Refill: 2 - meloxicam (MOBIC) 7.5 MG tablet; Take 1 tablet (7.5 mg total) by mouth 2 (two) times daily as needed for pain.  Dispense: 60 tablet; Refill: 3  4. Generalized anxiety disorder with panic attacks May continue valium 46m30mp to twice daily if needed for panic attacks/anxiety. New prescription sent to her pharmacy.  - diazepam (VALIUM) 5 MG tablet; Take 1/2 to 1 tablet po BID PRN only.  Dispense: 45 tablet; Refill: 2  General Counseling: CarDamyahrbalizes understanding of the findings of todays visit and agrees with plan of treatment. I have discussed any further diagnostic evaluation that may be needed or ordered today. We also reviewed her medications today. she has been encouraged to call the office with any questions or concerns that should arise related to todays visit.  Reviewed risks and possible side effects associated with taking opiates, benzodiazepines and other CNS depressants. Combination of these could cause dizziness and drowsiness. Advised patient not to drive or operate machinery when taking these medications, as patient's and other's life can be at risk and will have consequences. Patient verbalized understanding in this matter. Dependence and abuse for these drugs will be monitored closely. A Controlled substance policy and procedure is on file which allows [facility] to order a urine drug screen test at any visit. Patient understands and agrees with the plan  This patient was seen by HeaLeretha PolP Collaboration with Dr FozLavera Guise a part of collaborative care agreement  Orders Placed This Encounter  Procedures  . POCT HgB A1C     Meds ordered this encounter  Medications  . diazepam (VALIUM) 5 MG tablet    Sig: Take 1/2 to 1 tablet po BID PRN only.    Dispense:  45 tablet    Refill:  2    Take 1 tab po bid prn for muscle pain and tightness.    Order Specific Question:   Supervising Provider    Answer:   KHALavera Guise4[[id number]] meloxicam (MOBIC) 7.5 MG tablet    Sig: Take 1 tablet (7.5 mg total) by mouth 2 (two) times daily as needed for pain.    Dispense:  60 tablet    Refill:  3    Order Specific Question:   Supervising Provider    Answer:   KHALavera Guise4[[id number]] Time spent: 25 74nutes      Dr FozLavera Guiseternal medicine

## 2018-12-10 ENCOUNTER — Other Ambulatory Visit: Payer: Self-pay

## 2018-12-10 DIAGNOSIS — E1165 Type 2 diabetes mellitus with hyperglycemia: Secondary | ICD-10-CM

## 2018-12-10 MED ORDER — LIRAGLUTIDE 18 MG/3ML ~~LOC~~ SOPN: PEN_INJECTOR | SUBCUTANEOUS | 3 refills | Status: DC

## 2018-12-26 ENCOUNTER — Other Ambulatory Visit: Payer: Self-pay

## 2018-12-26 DIAGNOSIS — F410 Panic disorder [episodic paroxysmal anxiety] without agoraphobia: Secondary | ICD-10-CM

## 2018-12-26 DIAGNOSIS — F411 Generalized anxiety disorder: Secondary | ICD-10-CM

## 2018-12-26 MED ORDER — DULOXETINE HCL 60 MG PO CPEP: 60.0000 mg | ORAL_CAPSULE | Freq: Every day | ORAL | 3 refills | Status: DC

## 2019-02-19 ENCOUNTER — Other Ambulatory Visit: Payer: Self-pay

## 2019-02-19 ENCOUNTER — Ambulatory Visit: Payer: Medicaid Other | Admitting: Nurse Practitioner

## 2019-02-19 ENCOUNTER — Encounter: Payer: Self-pay | Admitting: Nurse Practitioner

## 2019-02-19 ENCOUNTER — Ambulatory Visit
Admission: RE | Admit: 2019-02-19 | Discharge: 2019-02-19 | Disposition: A | Discharge status: Home / Self Care | Payer: Medicaid Other | Source: Ambulatory Visit | Attending: Nurse Practitioner | Admitting: Nurse Practitioner

## 2019-02-19 ENCOUNTER — Other Ambulatory Visit: Payer: Self-pay | Admitting: Nurse Practitioner

## 2019-02-19 VITALS — BP 140/88 | HR 77 | Temp 97.5°F | Resp 16 | Ht 68.0 in | Wt 203.0 lb

## 2019-02-19 DIAGNOSIS — M79604 Pain in right leg: Secondary | ICD-10-CM | POA: Diagnosis not present

## 2019-02-19 DIAGNOSIS — M79671 Pain in right foot: Secondary | ICD-10-CM | POA: Insufficient documentation

## 2019-02-19 DIAGNOSIS — M79605 Pain in left leg: Secondary | ICD-10-CM | POA: Diagnosis present

## 2019-02-19 DIAGNOSIS — L02419 Cutaneous abscess of limb, unspecified: Secondary | ICD-10-CM

## 2019-02-19 DIAGNOSIS — M1A9XX0 Chronic gout, unspecified, without tophus (tophi): Secondary | ICD-10-CM

## 2019-02-19 DIAGNOSIS — I83893 Varicose veins of bilateral lower extremities with other complications: Secondary | ICD-10-CM | POA: Diagnosis not present

## 2019-02-19 DIAGNOSIS — L03119 Cellulitis of unspecified part of limb: Secondary | ICD-10-CM | POA: Diagnosis not present

## 2019-02-19 DIAGNOSIS — M79672 Pain in left foot: Secondary | ICD-10-CM | POA: Diagnosis present

## 2019-02-19 DIAGNOSIS — K219 Gastro-esophageal reflux disease without esophagitis: Secondary | ICD-10-CM

## 2019-02-19 MED ORDER — SULFAMETHOXAZOLE-TRIMETHOPRIM 800-160 MG PO TABS: 1.0000 | ORAL_TABLET | Freq: Two times a day (BID) | ORAL | 0 refills | Status: DC

## 2019-02-19 MED ORDER — OMEPRAZOLE 40 MG PO CPDR: 40.0000 mg | DELAYED_RELEASE_CAPSULE | Freq: Every day | ORAL | 3 refills | Status: DC

## 2019-02-19 MED ORDER — ALLOPURINOL 100 MG PO TABS: ORAL_TABLET | ORAL | 3 refills | Status: DC

## 2019-02-19 NOTE — Progress Notes (Signed)
[facility] [place], [place] [address]  Internal MEDICINE  Office Visit Note  Patient Name: Holly Espinoza  [id number]  [id number]  Date of Service: 02/19/2019   Pt is here for a sick visit.  Chief Complaint  Patient presents with  . Leg Swelling    left leg, entire leg is swollen and bigger than the right, worse in the evening time, looks like a bag on left ankle, painful; going on for two weeks  . Medication Refill    allopurinol, duloxetine, gemfibrozil  . Epistaxis    has been going on for a few days     The patient is here for acute visit. She states that she has been having swelling in both feet and legs, much more severe on the left side. Swelling going from foot all the way to the hip. She states in the mirror, her entire left leg is bigger than the right. She states that the legs are red and warm to touch, more severe on the left side. Leg is so swollen, she is unable to put her shoes on. Difficult for her to walk. She states that she did fall last week. Has an area on the left lower leg which has abrasion and scabbing. She states that swelling and pain started in her legs prior to the fall.        Current Medication:  Outpatient Encounter Medications as of 02/19/2019  Medication Sig  . allopurinol (ZYLOPRIM) 100 MG tablet Take 2 tab po at bedtime  . aspirin 81 MG chewable tablet Chew 81 mg by mouth daily.  . Colchicine (MITIGARE) 0.6 MG CAPS Take 1.2 mg by mouth See admin instructions. At onset of gout flare. May repeat one additional capsule 1 hour later if symptoms persist.  . Cyanocobalamin (B-12) 1000 MCG/ML KIT Inject 1,000 mcg as directed every 30 (thirty) days.  . diazepam (VALIUM) 5 MG tablet Take 1/2 to 1 tablet po BID PRN only.  . DULoxetine (CYMBALTA) 60 MG capsule Take 1 capsule (60 mg total) by mouth daily.  . fexofenadine (ALLEGRA) 180 MG tablet Take 180 mg by mouth daily.  . fluconazole (DIFLUCAN) 150 MG tablet Take 1 tablet  (150 mg total) by mouth daily. Take one tablet once and may repeat if symptoms persist in 3 days  . furosemide (LASIX) 20 MG tablet Take 1 tablet (20 mg total) by mouth daily as needed for fluid or edema.  Marland Kitchen gemfibrozil (LOPID) 600 MG tablet Take 1 tablet (600 mg total) by mouth 2 (two) times daily before a meal.  . Insulin Pen Needle (PEN NEEDLES 5/16") 30G X 8 MM MISC To use every day with victoza injections - E11.65  . levothyroxine (SYNTHROID) 75 MCG tablet Take 1 tablet (75 mcg total) by mouth daily before breakfast.  . liraglutide (VICTOZA) 18 MG/3ML SOPN Inject  1.8 mg [place]  daily  . meloxicam (MOBIC) 7.5 MG tablet Take 1 tablet (7.5 mg total) by mouth 2 (two) times daily as needed for pain.  . mirtazapine (REMERON) 15 MG tablet Take 1 tablet (15 mg total) by mouth at bedtime.  . mupirocin ointment (BACTROBAN) 2 % Place 1 application into the nose 2 (two) times daily.  Marland Kitchen nystatin ointment (MYCOSTATIN) Apply 1 application topically 3 (three) times daily.  Marland Kitchen omeprazole (PRILOSEC) 40 MG capsule Take 1 capsule (40 mg total) by mouth daily.  . ondansetron (ZOFRAN) 4 MG tablet Take 4 mg by mouth every 8 (eight) hours as  needed for nausea. For nausea  . pregabalin (LYRICA) 50 MG capsule Take 50 mg by mouth 2 (two) times daily.  . Syringe/Needle, Disp, (SYRINGE 3CC/20GX1") 20G X 1" 3 ML MISC To sue for B12 injections once monthly  . topiramate (TOPAMAX) 100 MG tablet Take 100 mg by mouth 2 (two) times daily.  Marland Kitchen sulfamethoxazole-trimethoprim (BACTRIM DS) 800-160 MG tablet Take 1 tablet by mouth 2 (two) times daily.   No facility-administered encounter medications on file as of 02/19/2019.      Medical History: Past Medical History:  Diagnosis Date  . Anxiety   . Arthritis    "pretty much from my neck to my feet" (07/08/2012)  . B12 deficiency anemia   . Chronic lower back pain   . Depression   . Diabetic peripheral neuropathy ([place])   . Gout   . High cholesterol   . Lobular carcinoma in  situ (LCIS) of left breast 02/02/2013  . Neuromuscular disorder ([place])   . Rheumatoid arthritis ([place])   . Type II diabetes mellitus (HCC)    recent hgb A1C was 5.4 per Dr. Humphrey Rolls, [place], [place]    Today's Vitals   02/19/19 1147  BP: 140/88  Pulse: 77  Resp: 16  Temp: (!) 97.5 F (36.4 C)  SpO2: 95%  Weight: 203 lb (92.1 kg)  Height: 5' 8"  (1.727 m)   Body mass index is 30.87 kg/m.  Review of Systems  Constitutional: Positive for activity change and fatigue. Negative for chills and unexpected weight change.       The patient is not doing much as her feet and legs hurt to just put weight on them and walk.   HENT: Negative for congestion, postnasal drip, rhinorrhea, sneezing and sore throat.   Respiratory: Negative for cough, chest tightness, shortness of breath and wheezing.   Cardiovascular: Positive for leg swelling. Negative for chest pain and palpitations.  Gastrointestinal: Negative for abdominal pain, constipation, diarrhea, nausea and vomiting.  Musculoskeletal: Positive for arthralgias, back pain, gait problem, joint swelling and myalgias. Negative for neck pain.  Skin: Negative for rash.  Neurological: Positive for weakness. Negative for tremors and numbness.  Hematological: Negative for adenopathy. Does not bruise/bleed easily.  Psychiatric/Behavioral: Negative for behavioral problems (Depression), sleep disturbance and suicidal ideas. The patient is not nervous/anxious.     Physical Exam Vitals and nursing note reviewed.  Constitutional:      General: She is in acute distress.     Appearance: She is well-developed. She is not diaphoretic.  HENT:     Head: Normocephalic and atraumatic.     Mouth/Throat:     Pharynx: No oropharyngeal exudate.  Eyes:     Pupils: Pupils are equal, round, and reactive to light.  Neck:     Thyroid: No thyromegaly.     Vascular: No JVD.     Trachea: No tracheal deviation.  Cardiovascular:     Rate and Rhythm: Normal rate and regular  rhythm.     Heart sounds: Normal heart sounds. No murmur. No friction rub. No gallop.      Comments: There is 2+ pitting edema in both feet, however, it is worse on the left than the right. Both legs are red and warm to palpate.  Pulmonary:     Effort: Pulmonary effort is normal. No respiratory distress.     Breath sounds: Normal breath sounds. No wheezing or rales.  Chest:     Chest wall: No tenderness.  Abdominal:     Palpations: Abdomen  is soft.  Musculoskeletal:        General: Normal range of motion.     Cervical back: Normal range of motion and neck supple.  Lymphadenopathy:     Cervical: No cervical adenopathy.  Skin:    General: Skin is warm and dry.     Comments: Skin of both lower legs is red and tender and warm to touch. There is 2+ pitting edema, bilaterally, worse on the left than the right. Shoes are so tight, she can barely get them on. There is small abrasion present on the left shin with scabbing present.   Neurological:     Mental Status: She is alert and oriented to person, place, and time.     Cranial Nerves: No cranial nerve deficit.  Psychiatric:        Behavior: Behavior normal.        Thought Content: Thought content normal.        Judgment: Judgment normal.    Assessment/Plan: 1. Varicose veins of leg with edema, bilateral There is severe swelling and 2+edema in both feet accompanied by significant pain. Will get STAT venous ultrasound of bilateral legs.  - VAS [place] LOWER EXTREMITY VENOUS (DVT); Future  2. Bilateral leg and foot pain There is severe swelling and 2+edema in both feet accompanied by significant pain. Will get STAT venous ultrasound of bilateral legs.  - VAS [place] LOWER EXTREMITY VENOUS (DVT); Future - sulfamethoxazole-trimethoprim (BACTRIM DS) 800-160 MG tablet; Take 1 tablet by mouth 2 (two) times daily.  Dispense: 20 tablet; Refill: 0  3. Cellulitis and abscess of leg Start bactrim DS bid for next 10 days to cover bacterial causes of pain and  swelling in the legs.  - sulfamethoxazole-trimethoprim (BACTRIM DS) 800-160 MG tablet; Take 1 tablet by mouth 2 (two) times daily.  Dispense: 20 tablet; Refill: 0  General Counseling: Oscar verbalizes understanding of the findings of todays visit and agrees with plan of treatment. I have discussed any further diagnostic evaluation that may be needed or ordered today. We also reviewed her medications today. she has been encouraged to call the office with any questions or concerns that should arise related to todays visit.    Counseling:  This patient was seen by Leretha Pol FNP Collaboration with Dr Lavera Guise as a part of collaborative care agreement  Orders Placed This Encounter  Procedures  . VAS [place] LOWER EXTREMITY VENOUS (DVT)    Meds ordered this encounter  Medications  . sulfamethoxazole-trimethoprim (BACTRIM DS) 800-160 MG tablet    Sig: Take 1 tablet by mouth 2 (two) times daily.    Dispense:  20 tablet    Refill:  0    Order Specific Question:   Supervising Provider    Answer:   Lavera Guise [[id number]]    Time spent: 25 Minutes

## 2019-02-19 NOTE — Progress Notes (Signed)
Please let the patient know that her ultrasound of her legs was negative for DVT. Thanks.

## 2019-02-20 ENCOUNTER — Ambulatory Visit: Payer: Medicaid Other

## 2019-02-20 ENCOUNTER — Telehealth: Payer: Self-pay

## 2019-02-20 NOTE — Telephone Encounter (Signed)
-----   Message from Ronnell Freshwater, NP sent at 02/19/2019  5:25 PM EST ----- Please let the patient know that her ultrasound of her legs was negative for DVT. Thanks.

## 2019-02-20 NOTE — Telephone Encounter (Signed)
Called pt per Nira Conn to notify her that her ultrasound of her legs were negative for DVT.

## 2019-02-24 ENCOUNTER — Telehealth: Payer: Self-pay

## 2019-02-24 NOTE — Telephone Encounter (Signed)
LMOM FOR PATIENT TO COME IN AT A DIFFERENT TIME THAN SCHEDULED FOR 02-26-19 OV.

## 2019-02-25 ENCOUNTER — Telehealth: Payer: Self-pay

## 2019-02-25 NOTE — Telephone Encounter (Signed)
CONFIRMED AND SCREENED FOR 02-26-19 OV.

## 2019-02-26 ENCOUNTER — Encounter: Payer: Self-pay | Admitting: Nurse Practitioner

## 2019-02-26 ENCOUNTER — Other Ambulatory Visit: Payer: Self-pay

## 2019-02-26 ENCOUNTER — Ambulatory Visit: Payer: Medicaid Other | Admitting: Nurse Practitioner

## 2019-02-26 VITALS — BP 120/86 | HR 80 | Temp 97.1°F | Resp 16 | Ht 68.0 in | Wt 205.0 lb

## 2019-02-26 DIAGNOSIS — F410 Panic disorder [episodic paroxysmal anxiety] without agoraphobia: Secondary | ICD-10-CM

## 2019-02-26 DIAGNOSIS — M79605 Pain in left leg: Secondary | ICD-10-CM

## 2019-02-26 DIAGNOSIS — M79671 Pain in right foot: Secondary | ICD-10-CM

## 2019-02-26 DIAGNOSIS — E785 Hyperlipidemia, unspecified: Secondary | ICD-10-CM

## 2019-02-26 DIAGNOSIS — R0600 Dyspnea, unspecified: Secondary | ICD-10-CM

## 2019-02-26 DIAGNOSIS — L03119 Cellulitis of unspecified part of limb: Secondary | ICD-10-CM | POA: Diagnosis not present

## 2019-02-26 DIAGNOSIS — M79604 Pain in right leg: Secondary | ICD-10-CM

## 2019-02-26 DIAGNOSIS — M7989 Other specified soft tissue disorders: Secondary | ICD-10-CM

## 2019-02-26 DIAGNOSIS — M79672 Pain in left foot: Secondary | ICD-10-CM

## 2019-02-26 DIAGNOSIS — M542 Cervicalgia: Secondary | ICD-10-CM

## 2019-02-26 DIAGNOSIS — L02419 Cutaneous abscess of limb, unspecified: Secondary | ICD-10-CM

## 2019-02-26 DIAGNOSIS — R0609 Other forms of dyspnea: Secondary | ICD-10-CM

## 2019-02-26 DIAGNOSIS — M79662 Pain in left lower leg: Secondary | ICD-10-CM

## 2019-02-26 DIAGNOSIS — F411 Generalized anxiety disorder: Secondary | ICD-10-CM

## 2019-02-26 MED ORDER — HYDROCHLOROTHIAZIDE 25 MG PO TABS: 25.0000 mg | ORAL_TABLET | Freq: Every day | ORAL | 3 refills | Status: DC

## 2019-02-26 MED ORDER — DULOXETINE HCL 60 MG PO CPEP: 60.0000 mg | ORAL_CAPSULE | Freq: Every day | ORAL | 3 refills | Status: DC

## 2019-02-26 MED ORDER — PREDNISONE 10 MG (48) PO TBPK: ORAL_TABLET | ORAL | 0 refills | Status: DC

## 2019-02-26 MED ORDER — DIAZEPAM 5 MG PO TABS: ORAL_TABLET | ORAL | 2 refills | Status: DC

## 2019-02-26 MED ORDER — SULFAMETHOXAZOLE-TRIMETHOPRIM 800-160 MG PO TABS: 1.0000 | ORAL_TABLET | Freq: Two times a day (BID) | ORAL | 0 refills | Status: DC

## 2019-02-26 MED ORDER — GEMFIBROZIL 600 MG PO TABS: 600.0000 mg | ORAL_TABLET | Freq: Two times a day (BID) | ORAL | 3 refills | Status: DC

## 2019-02-26 NOTE — Progress Notes (Signed)
[facility], [place] [address]  Internal MEDICINE  Office Visit Note  Patient Name: Holly Espinoza  [id number]  [id number]  Date of Service: 03/08/2019  Chief Complaint  Patient presents with  . Medical Management of Chronic Issues    1 week follow up swelling has gone down     The patient presents for follow up visit. Today,sShe states that she continues to have swelling in both feet and legs, much more severe on the left side. Swelling going from foot all the way to the hip. She states that the legs are red and warm to touch,.Leg is so swollen, she is unable to put her shoes on. Difficult for her to walk. She did have STAT ultrasound of the extremities to evaluate for blood clot and this was negatie, bilaterally. She continues to take bactrim DS twice daily. Symptoms have improved very little. Abrasion which is present on the left shin is slightly improved. Both legs are still very swollen, worse on the left than the right. Hurts to stand and walk.       Current Medication: Outpatient Encounter Medications as of 02/26/2019  Medication Sig  . allopurinol (ZYLOPRIM) 100 MG tablet Take 2 tab po at bedtime  . aspirin 81 MG chewable tablet Chew 81 mg by mouth daily.  . Colchicine (MITIGARE) 0.6 MG CAPS Take 1.2 mg by mouth See admin instructions. At onset of gout flare. May repeat one additional capsule 1 hour later if symptoms persist.  . Cyanocobalamin (B-12) 1000 MCG/ML KIT Inject 1,000 mcg as directed every 30 (thirty) days.  . diazepam (VALIUM) 5 MG tablet Take 1/2 to 1 tablet po BID PRN only.  . DULoxetine (CYMBALTA) 60 MG capsule Take 1 capsule (60 mg total) by mouth daily.  . fexofenadine (ALLEGRA) 180 MG tablet Take 180 mg by mouth daily.  . fluconazole (DIFLUCAN) 150 MG tablet Take 1 tablet (150 mg total) by mouth daily. Take one tablet once and may repeat if symptoms persist in 3 days  . furosemide (LASIX) 20 MG tablet Take 1 tablet (20 mg  total) by mouth daily as needed for fluid or edema.  Marland Kitchen gemfibrozil (LOPID) 600 MG tablet Take 1 tablet (600 mg total) by mouth 2 (two) times daily before a meal.  . hydrochlorothiazide (HYDRODIURIL) 25 MG tablet Take 1 tablet (25 mg total) by mouth daily.  . Insulin Pen Needle (PEN NEEDLES 5/16") 30G X 8 MM MISC To use every day with victoza injections - E11.65  . levothyroxine (SYNTHROID) 75 MCG tablet Take 1 tablet (75 mcg total) by mouth daily before breakfast.  . liraglutide (VICTOZA) 18 MG/3ML SOPN Inject  1.8 mg [place]  daily  . meloxicam (MOBIC) 7.5 MG tablet Take 1 tablet (7.5 mg total) by mouth 2 (two) times daily as needed for pain.  . mirtazapine (REMERON) 15 MG tablet Take 1 tablet (15 mg total) by mouth at bedtime.  . mupirocin ointment (BACTROBAN) 2 % Place 1 application into the nose 2 (two) times daily.  Marland Kitchen nystatin ointment (MYCOSTATIN) Apply 1 application topically 3 (three) times daily.  Marland Kitchen omeprazole (PRILOSEC) 40 MG capsule Take 1 capsule (40 mg total) by mouth daily.  . ondansetron (ZOFRAN) 4 MG tablet Take 4 mg by mouth every 8 (eight) hours as needed for nausea. For nausea  . predniSONE (STERAPRED UNI-PAK 48 TAB) 10 MG (48) TBPK tablet 12 day taper - take by mouth as directed for 12 days  . pregabalin (LYRICA)  50 MG capsule Take 50 mg by mouth 2 (two) times daily.  Marland Kitchen sulfamethoxazole-trimethoprim (BACTRIM DS) 800-160 MG tablet Take 1 tablet by mouth 2 (two) times daily.  . Syringe/Needle, Disp, (SYRINGE 3CC/20GX1") 20G X 1" 3 ML MISC To sue for B12 injections once monthly  . topiramate (TOPAMAX) 100 MG tablet Take 100 mg by mouth 2 (two) times daily.  . [DISCONTINUED] diazepam (VALIUM) 5 MG tablet Take 1/2 to 1 tablet po BID PRN only.  . [DISCONTINUED] DULoxetine (CYMBALTA) 60 MG capsule Take 1 capsule (60 mg total) by mouth daily.  . [DISCONTINUED] gemfibrozil (LOPID) 600 MG tablet Take 1 tablet (600 mg total) by mouth 2 (two) times daily before a meal.  . [DISCONTINUED]  sulfamethoxazole-trimethoprim (BACTRIM DS) 800-160 MG tablet Take 1 tablet by mouth 2 (two) times daily.   No facility-administered encounter medications on file as of 02/26/2019.    Surgical History: Past Surgical History:  Procedure Laterality Date  . ABDOMINAL HYSTERECTOMY  2001?  . ANTERIOR AND POSTERIOR REPAIR N/A 11/01/2015   Procedure: CYSTOSCOPY, ANTERIOR (CYSTOCELE) AND POSTERIOR REPAIR (RECTOCELE);  Surgeon: Bjorn Loser, MD;  Location: [facility];  Service: Urology;  Laterality: N/A;  . ANTERIOR CERVICAL DECOMP/DISCECTOMY FUSION  2012  . BACK SURGERY  2001   laminectomy  . BREAST BIOPSY Left 2014  . BREAST LUMPECTOMY Left 02/25/2013  . BREAST SURGERY Left 02/25/13   lumpectomy  . CHOLECYSTECTOMY  1980's  . DILATION AND CURETTAGE OF UTERUS  [phone]   "probably 3" (07/08/2012)  . FRACTURE SURGERY     C4-7 Surgery for fracture  . LUMBAR WOUND DEBRIDEMENT N/A 08/12/2012   Procedure: LUMBAR WOUND DEBRIDEMENT;  Surgeon: Faythe Ghee, MD;  Location: [facility];  Service: Neurosurgery;  Laterality: N/A;  Lumbar Wound Debridement  . POSTERIOR LUMBAR FUSION  07/08/2012  . TONSILLECTOMY  ~ 1968    Medical History: Past Medical History:  Diagnosis Date  . Anxiety   . Arthritis    "pretty much from my neck to my feet" (07/08/2012)  . B12 deficiency anemia   . Chronic lower back pain   . Depression   . Diabetic peripheral neuropathy ([place])   . Gout   . High cholesterol   . 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 Neuromuscular disorder ([place])   . Rheumatoid arthritis ([place])   . Type II diabetes mellitus (HCC)    recent hgb A1C was 5.4 per Dr. Humphrey Rolls, [place], [place]    Family History: Family History  Problem Relation Age of Onset  . Kidney disease Father   . Cancer Other        breast great grandmother  . Breast cancer Other     Social History   Socioeconomic History  . Marital status: Single    Spouse name: Not on file  . Number of  children: Not on file  . Years of education: Not on file  . Highest education level: Not on file  Occupational History  . Not on file  Tobacco Use  . Smoking status: Current Every Day Smoker    Packs/day: 0.50    Years: 40.00    Pack years: 20.00    Types: Cigarettes  . Smokeless tobacco: Never Used  Substance and Sexual Activity  . Alcohol use: No  . Drug use: No    Types: Marijuana, Cocaine    Comment: 07/08/2012 "last drug use >8 yr ago" (07/08/2012)  . Sexual activity: Yes    Comment: quit a  long time ago  Other Topics Concern  . Not on file  Social History Narrative  . Not on file   Social Determinants of Health   Financial Resource Strain:   . Difficulty of Paying Living Expenses: Not on file  Food Insecurity:   . Worried About Charity fundraiser in the Last Year: Not on file  . Ran Out of Food in the Last Year: Not on file  Transportation Needs:   . Lack of Transportation (Medical): Not on file  . Lack of Transportation (Non-Medical): Not on file  Physical Activity:   . Days of Exercise per Week: Not on file  . Minutes of Exercise per Session: Not on file  Stress:   . Feeling of Stress : Not on file  Social Connections:   . Frequency of Communication with Friends and Family: Not on file  . Frequency of Social Gatherings with Friends and Family: Not on file  . Attends Religious Services: Not on file  . Active Member of Clubs or Organizations: Not on file  . Attends Archivist Meetings: Not on file  . Marital Status: Not on file  Intimate Partner Violence:   . Fear of Current or Ex-Partner: Not on file  . Emotionally Abused: Not on file  . Physically Abused: Not on file  . Sexually Abused: Not on file      Review of Systems  Constitutional: Positive for activity change and fatigue. Negative for chills and unexpected weight change.       The patient is not doing much as her feet and legs hurt to just put weight on them and walk.   HENT: Negative  for congestion, postnasal drip, rhinorrhea, sneezing and sore throat.   Respiratory: Negative for cough, chest tightness, shortness of breath and wheezing.   Cardiovascular: Positive for leg swelling. Negative for chest pain and palpitations.       Very mild improvement in the swelling and pain in both lower legs. Still very painful to put weight on her legs and to walk. Difficult to put her shoes on as her feet are so swollen.   Gastrointestinal: Negative for abdominal pain, constipation, diarrhea, nausea and vomiting.  Endocrine:       Blood sugars doing well   Musculoskeletal: Positive for arthralgias, back pain, gait problem, joint swelling and myalgias. Negative for neck pain.  Skin: Negative for rash.  Neurological: Positive for weakness. Negative for tremors and numbness.  Hematological: Negative for adenopathy. Does not bruise/bleed easily.  Psychiatric/Behavioral: Negative for behavioral problems (Depression), sleep disturbance and suicidal ideas. The patient is not nervous/anxious.     Vital Signs: BP 120/86   Pulse 80   Temp (!) 97.1 F (36.2 C)   Resp 16   Ht 5' 8"  (1.727 m)   Wt 205 lb (93 kg)   SpO2 98%   BMI 31.17 kg/m    Physical Exam Vitals and nursing note reviewed.  Constitutional:      Appearance: Normal appearance. She is well-developed. She is not diaphoretic.  HENT:     Head: Normocephalic and atraumatic.     Mouth/Throat:     Pharynx: No oropharyngeal exudate.  Eyes:     Pupils: Pupils are equal, round, and reactive to light.  Neck:     Thyroid: No thyromegaly.     Vascular: No JVD.     Trachea: No tracheal deviation.  Cardiovascular:     Rate and Rhythm: Normal rate and regular rhythm.  Heart sounds: Normal heart sounds. No murmur. No friction rub. No gallop.      Comments: There is 2+ pitting edema in both feet, however, it is worse on the left than the right. Both legs are red and warm to palpate.  Pulmonary:     Effort: Pulmonary effort is  normal. No respiratory distress.     Breath sounds: Normal breath sounds. No wheezing or rales.  Chest:     Chest wall: No tenderness.  Abdominal:     Palpations: Abdomen is soft.  Musculoskeletal:        General: Swelling and tenderness present. Normal range of motion.     Cervical back: Normal range of motion and neck supple.     Right lower leg: Edema present.     Left lower leg: Edema present.     Comments: Bilateral lower leg edema with tenderness and redness. Slightly improved from her last visit. There is 2+pitting edema, bilaterally, however, the left side is worse than the right .  Lymphadenopathy:     Cervical: No cervical adenopathy.  Skin:    General: Skin is warm and dry.     Comments: Skin of both lower legs is red and tender and warm to touch. There is 2+ pitting edema, bilaterally, worse on the left than the right. Shoes are so tight, she can barely get them on. There is small abrasion present on the left shin with scabbing present.   Neurological:     Mental Status: She is alert and oriented to person, place, and time.     Cranial Nerves: No cranial nerve deficit.  Psychiatric:        Behavior: Behavior normal.        Thought Content: Thought content normal.        Judgment: Judgment normal.    Assessment/Plan: 1. Bilateral leg and foot pain Continue bactrim DS bid for additional 7 days. Add prednisone taper. Take as directed for 12 days.  - predniSONE (STERAPRED UNI-PAK 48 TAB) 10 MG (48) TBPK tablet; 12 day taper - take by mouth as directed for 12 days  Dispense: 48 tablet; Refill: 0 - sulfamethoxazole-trimethoprim (BACTRIM DS) 800-160 MG tablet; Take 1 tablet by mouth 2 (two) times daily.  Dispense: 14 tablet; Refill: 0  2. Pain and swelling of left lower leg Will x-ray the tibia/fibula and the ankle of the left side. Will get arterial ultrasound of bilateral lower extremitites. Echocardiogram ordered. Add HCTZ 19m in the AM to help reduce swelling.  - DG  Tibia/Fibula Left; Future - DG Ankle Complete Left; Future - [place] Ext Art Bilat; Future - ECHOCARDIOGRAM COMPLETE; Future - hydrochlorothiazide (HYDRODIURIL) 25 MG tablet; Take 1 tablet (25 mg total) by mouth daily.  Dispense: 30 tablet; Refill: 3  3. Cellulitis and abscess of leg Continue bactrim DS bid for additional 7 days.  - sulfamethoxazole-trimethoprim (BACTRIM DS) 800-160 MG tablet; Take 1 tablet by mouth 2 (two) times daily.  Dispense: 14 tablet; Refill: 0  4. Dyspnea on exertion Echo ordered for further evaluation.  - ECHOCARDIOGRAM COMPLETE; Future  5. Cervicalgia May continue diazepam 567m taking 1/2 to 1 tablet twice daily as needed.  - diazepam (VALIUM) 5 MG tablet; Take 1/2 to 1 tablet po BID PRN only.  Dispense: 45 tablet; Refill: 2  6. Hyperlipidemia, unspecified hyperlipidemia type - gemfibrozil (LOPID) 600 MG tablet; Take 1 tablet (600 mg total) by mouth 2 (two) times daily before a meal.  Dispense: 60 tablet; Refill: 3  7. Generalized anxiety disorder with panic attacks Continue duloxetine 59m everyday. May take valium up to twice daily as needed for acute anxiety.  - diazepam (VALIUM) 5 MG tablet; Take 1/2 to 1 tablet po BID PRN only.  Dispense: 45 tablet; Refill: 2 - DULoxetine (CYMBALTA) 60 MG capsule; Take 1 capsule (60 mg total) by mouth daily.  Dispense: 30 capsule; Refill: 3  General Counseling: CDemicaverbalizes understanding of the findings of todays visit and agrees with plan of treatment. I have discussed any further diagnostic evaluation that may be needed or ordered today. We also reviewed her medications today. she has been encouraged to call the office with any questions or concerns that should arise related to todays visit.  This patient was seen by HLeretha PolFNP Collaboration with Dr FLavera Guiseas a part of collaborative care agreement  Orders Placed This Encounter  Procedures  . DG Tibia/Fibula Left  . DG Ankle Complete Left  . [place] Lower Ext Art Bilat  . ECHOCARDIOGRAM COMPLETE    Meds ordered this encounter  Medications  . predniSONE (STERAPRED UNI-PAK 48 TAB) 10 MG (48) TBPK tablet    Sig: 12 day taper - take by mouth as directed for 12 days    Dispense:  48 tablet    Refill:  0    Order Specific Question:   Supervising Provider    Answer:   KLavera Guise[[place] . sulfamethoxazole-trimethoprim (BACTRIM DS) 800-160 MG tablet    Sig: Take 1 tablet by mouth 2 (two) times daily.    Dispense:  14 tablet    Refill:  0    Continue treatment for additional seven days.    Order Specific Question:   Supervising Provider    Answer:   KLavera Guise[[[id number]] . hydrochlorothiazide (HYDRODIURIL) 25 MG tablet    Sig: Take 1 tablet (25 mg total) by mouth daily.    Dispense:  30 tablet    Refill:  3    Order Specific Question:   Supervising Provider    Answer:   KLavera Guise[[[id number]] . diazepam (VALIUM) 5 MG tablet    Sig: Take 1/2 to 1 tablet po BID PRN only.    Dispense:  45 tablet    Refill:  2    Take 1 tab po bid prn for muscle pain and tightness.    Order Specific Question:   Supervising Provider    Answer:   KLavera Guise[[[id number]] . gemfibrozil (LOPID) 600 MG tablet    Sig: Take 1 tablet (600 mg total) by mouth 2 (two) times daily before a meal.    Dispense:  60 tablet    Refill:  3    Order Specific Question:   Supervising Provider    Answer:   KLavera Guise[[place] . DULoxetine (CYMBALTA) 60 MG capsule    Sig: Take 1 capsule (60 mg total) by mouth daily.    Dispense:  30 capsule    Refill:  3    Order Specific Question:   Supervising Provider    Answer:   KLavera Guise[[[id number]]   Time spent: 473Minutes      Dr FLavera GuiseInternal medicine

## 2019-03-08 DIAGNOSIS — M79662 Pain in left lower leg: Secondary | ICD-10-CM | POA: Insufficient documentation

## 2019-03-08 DIAGNOSIS — M7989 Other specified soft tissue disorders: Secondary | ICD-10-CM | POA: Insufficient documentation

## 2019-03-10 ENCOUNTER — Encounter: Payer: Medicaid Other | Admitting: Nurse Practitioner

## 2019-03-12 ENCOUNTER — Other Ambulatory Visit: Payer: Self-pay

## 2019-03-12 ENCOUNTER — Ambulatory Visit: Payer: Medicaid Other | Admitting: Nurse Practitioner

## 2019-03-12 VITALS — Temp 99.4°F | Ht 68.5 in | Wt 204.0 lb

## 2019-03-12 DIAGNOSIS — B379 Candidiasis, unspecified: Secondary | ICD-10-CM

## 2019-03-12 DIAGNOSIS — R05 Cough: Secondary | ICD-10-CM | POA: Diagnosis not present

## 2019-03-12 DIAGNOSIS — H60503 Unspecified acute noninfective otitis externa, bilateral: Secondary | ICD-10-CM | POA: Diagnosis not present

## 2019-03-12 DIAGNOSIS — J069 Acute upper respiratory infection, unspecified: Secondary | ICD-10-CM

## 2019-03-12 DIAGNOSIS — R059 Cough, unspecified: Secondary | ICD-10-CM

## 2019-03-12 MED ORDER — CIPROFLOXACIN-DEXAMETHASONE 0.3-0.1 % OT SUSP: 4.0000 [drp] | Freq: Two times a day (BID) | OTIC | 0 refills | Status: DC

## 2019-03-12 MED ORDER — GUAIFENESIN-DM 100-10 MG/5ML PO SYRP: 5.0000 mL | ORAL_SOLUTION | ORAL | 1 refills | Status: DC | PRN

## 2019-03-12 MED ORDER — FLUCONAZOLE 150 MG PO TABS: 150.0000 mg | ORAL_TABLET | Freq: Every day | ORAL | 0 refills | Status: DC

## 2019-03-12 MED ORDER — CLARITHROMYCIN 500 MG PO TABS: 500.0000 mg | ORAL_TABLET | Freq: Two times a day (BID) | ORAL | 0 refills | Status: DC

## 2019-03-12 NOTE — Progress Notes (Signed)
[facility] [place], [place] [address]  Internal MEDICINE  Telephone Visit  Patient Name: Holly Espinoza  [id number]  [id number]  Date of Service: 03/18/2019 b  I connected with the patient at 11:35am telephone and verified the patients identity using two identifiers.   I discussed the limitations, risks, security and privacy concerns of performing an evaluation and management service by telephone and the availability of in person appointments. I also discussed with the patient that there may be a patient responsible charge related to the service.  The patient expressed understanding and agrees to proceed.    Chief Complaint  Patient presents with  . Telephone Assessment  . Telephone Screen  . Ear Pain    BOTH  . Sinusitis  . Cough  . Sore Throat    The patient has been contacted via telephone for follow up visit due to concerns for spread of novel coronavirus. The patient presents for acute visit. The patient states that her ears feel full and hurt. Her nose is congested. She has a low-grade fever. She states that she has sore throat with tight chest, hurts to take deep breath in. The patient does not believe she has been around anyone with COVID 19. However, she states that her mother and granddaughter are both sick as well.       Current Medication: Outpatient Encounter Medications as of 03/12/2019  Medication Sig  . allopurinol (ZYLOPRIM) 100 MG tablet Take 2 tab po at bedtime  . aspirin 81 MG chewable tablet Chew 81 mg by mouth daily.  . Colchicine (MITIGARE) 0.6 MG CAPS Take 1.2 mg by mouth See admin instructions. At onset of gout flare. May repeat one additional capsule 1 hour later if symptoms persist.  . Cyanocobalamin (B-12) 1000 MCG/ML KIT Inject 1,000 mcg as directed every 30 (thirty) days.  . diazepam (VALIUM) 5 MG tablet Take 1/2 to 1 tablet po BID PRN only.  . DULoxetine (CYMBALTA) 60 MG capsule Take 1 capsule (60 mg total) by mouth daily.   . fexofenadine (ALLEGRA) 180 MG tablet Take 180 mg by mouth daily.  . fluconazole (DIFLUCAN) 150 MG tablet Take 1 tablet (150 mg total) by mouth daily. Take one tablet once and may repeat if symptoms persist in 3 days  . furosemide (LASIX) 20 MG tablet Take 1 tablet (20 mg total) by mouth daily as needed for fluid or edema.  [place] Kitchen gemfibrozil (LOPID) 600 MG tablet Take 1 tablet (600 mg total) by mouth 2 (two) times daily before a meal.  . hydrochlorothiazide (HYDRODIURIL) 25 MG tablet Take 1 tablet (25 mg total) by mouth daily.  . Insulin Pen Needle (PEN NEEDLES 5/16") 30G X 8 MM MISC To use every day with victoza injections - E11.65  . levothyroxine (SYNTHROID) 75 MCG tablet Take 1 tablet (75 mcg total) by mouth daily before breakfast.  . liraglutide (VICTOZA) 18 MG/3ML SOPN Inject  1.8 mg [place]  daily  . meloxicam (MOBIC) 7.5 MG tablet Take 1 tablet (7.5 mg total) by mouth 2 (two) times daily as needed for pain.  . mirtazapine (REMERON) 15 MG tablet Take 1 tablet (15 mg total) by mouth at bedtime.  . mupirocin ointment (BACTROBAN) 2 % Place 1 application into the nose 2 (two) times daily.  [place] Kitchen nystatin ointment (MYCOSTATIN) Apply 1 application topically 3 (three) times daily.  [place] Kitchen omeprazole (PRILOSEC) 40 MG capsule Take 1 capsule (40 mg total) by mouth daily.  . ondansetron (ZOFRAN) 4 MG tablet Take  4 mg by mouth every 8 (eight) hours as needed for nausea. For nausea  . predniSONE (STERAPRED UNI-PAK 48 TAB) 10 MG (48) TBPK tablet 12 day taper - take by mouth as directed for 12 days  . pregabalin (LYRICA) 50 MG capsule Take 50 mg by mouth 2 (two) times daily.  [place] Kitchen sulfamethoxazole-trimethoprim (BACTRIM DS) 800-160 MG tablet Take 1 tablet by mouth 2 (two) times daily.  . Syringe/Needle, Disp, (SYRINGE 3CC/20GX1") 20G X 1" 3 ML MISC To sue for B12 injections once monthly  . topiramate (TOPAMAX) 100 MG tablet Take 100 mg by mouth 2 (two) times daily.  . [DISCONTINUED] fluconazole (DIFLUCAN) 150 MG tablet Take  1 tablet (150 mg total) by mouth daily. Take one tablet once and may repeat if symptoms persist in 3 days  . ciprofloxacin-dexamethasone (CIPRODEX) OTIC suspension Place 4 drops into the right ear 2 (two) times daily.  . clarithromycin (BIAXIN) 500 MG tablet Take 1 tablet (500 mg total) by mouth 2 (two) times daily.  [place] Kitchen guaiFENesin-dextromethorphan (ROBITUSSIN DM) 100-10 MG/5ML syrup Take 5 mLs by mouth every 4 (four) hours as needed for cough.   No facility-administered encounter medications on file as of 03/12/2019.    Surgical History: Past Surgical History:  Procedure Laterality Date  . ABDOMINAL HYSTERECTOMY  2001?  . ANTERIOR AND POSTERIOR REPAIR N/A 11/01/2015   Procedure: CYSTOSCOPY, ANTERIOR (CYSTOCELE) AND POSTERIOR REPAIR (RECTOCELE);  Surgeon: Bjorn Loser, MD;  Location: [facility];  Service: Urology;  Laterality: N/A;  . ANTERIOR CERVICAL DECOMP/DISCECTOMY FUSION  2012  . BACK SURGERY  2001   laminectomy  . BREAST BIOPSY Left 2014  . BREAST LUMPECTOMY Left 02/25/2013  . BREAST SURGERY Left 02/25/13   lumpectomy  . CHOLECYSTECTOMY  1980's  . DILATION AND CURETTAGE OF UTERUS  [phone]   "probably 3" (07/08/2012)  . FRACTURE SURGERY     C4-7 Surgery for fracture  . LUMBAR WOUND DEBRIDEMENT N/A 08/12/2012   Procedure: LUMBAR WOUND DEBRIDEMENT;  Surgeon: Faythe Ghee, MD;  Location: [facility];  Service: Neurosurgery;  Laterality: N/A;  Lumbar Wound Debridement  . POSTERIOR LUMBAR FUSION  07/08/2012  . TONSILLECTOMY  ~ 1968    Medical History: Past Medical History:  Diagnosis Date  . Anxiety   . Arthritis    "pretty much from my neck to my feet" (07/08/2012)  . B12 deficiency anemia   . Chronic lower back pain   . Depression   . Diabetic peripheral neuropathy ([place])   . Gout   . High cholesterol   . 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 Neuromuscular disorder ([place])   . Rheumatoid arthritis ([place])   . Type II diabetes mellitus (HCC)     recent hgb A1C was 5.4 per Dr. Humphrey Rolls, [place], [place]    Family History: Family History  Problem Relation Age of Onset  . Kidney disease Father   . Cancer Other        breast great grandmother  . Breast cancer Other     Social History   Socioeconomic History  . Marital status: Single    Spouse name: Not on file  . Number of children: Not on file  . Years of education: Not on file  . Highest education level: Not on file  Occupational History  . Not on file  Tobacco Use  . Smoking status: Current Every Day Smoker    Packs/day: 0.50    Years: 40.00    Pack years: 20.00  Types: Cigarettes  . Smokeless tobacco: Never Used  Substance and Sexual Activity  . Alcohol use: No  . Drug use: No    Types: Marijuana, Cocaine    Comment: 07/08/2012 "last drug use >8 yr ago" (07/08/2012)  . Sexual activity: Yes    Comment: quit a long time ago  Other Topics Concern  . Not on file  Social History Narrative  . Not on file   Social Determinants of Health   Financial Resource Strain:   . Difficulty of Paying Living Expenses: Not on file  Food Insecurity:   . Worried About Charity fundraiser in the Last Year: Not on file  . Ran Out of Food in the Last Year: Not on file  Transportation Needs:   . Lack of Transportation (Medical): Not on file  . Lack of Transportation (Non-Medical): Not on file  Physical Activity:   . Days of Exercise per Week: Not on file  . Minutes of Exercise per Session: Not on file  Stress:   . Feeling of Stress : Not on file  Social Connections:   . Frequency of Communication with Friends and Family: Not on file  . Frequency of Social Gatherings with Friends and Family: Not on file  . Attends Religious Services: Not on file  . Active Member of Clubs or Organizations: Not on file  . Attends Archivist Meetings: Not on file  . Marital Status: Not on file  Intimate Partner Violence:   . Fear of Current or Ex-Partner: Not on file  . Emotionally  Abused: Not on file  . Physically Abused: Not on file  . Sexually Abused: Not on file      Review of Systems  Constitutional: Positive for chills, fatigue and fever. Negative for unexpected weight change.  HENT: Positive for congestion, postnasal drip, rhinorrhea, sinus pressure, sinus pain and sore throat. Negative for sneezing.   Respiratory: Positive for cough. Negative for chest tightness, shortness of breath and wheezing.   Cardiovascular: Negative for chest pain and palpitations.  Gastrointestinal: Negative for abdominal pain, constipation, diarrhea, nausea and vomiting.  Musculoskeletal: Positive for arthralgias and myalgias. Negative for back pain, joint swelling and neck pain.  Skin: Negative for rash.  Neurological: Positive for dizziness and headaches. Negative for tremors and numbness.  Hematological: Positive for adenopathy. Does not bruise/bleed easily.  Psychiatric/Behavioral: Negative for behavioral problems (Depression), sleep disturbance and suicidal ideas. The patient is not nervous/anxious.     Today's Vitals   03/12/19 1058  Temp: 99.4 F (37.4 C)  Weight: 204 lb (92.5 kg)  Height: 5' 8.5" (1.74 m)   Body mass index is 30.57 kg/m.  Observation/Objective:   The patient is alert and oriented. She is pleasant and answers all questions appropriately. Breathing is non-labored. She is in no acute distress at this time. The patient is nasally congested with deep, congested sounding cough.    Assessment/Plan: 1. Acute upper respiratory infection Start biaxin 582m bid for 10 days. Rest and increase fluids. Use OTC medications to treat acute symptom.s  - clarithromycin (BIAXIN) 500 MG tablet; Take 1 tablet (500 mg total) by mouth 2 (two) times daily.  Dispense: 20 tablet; Refill: 0  2. Acute otitis externa of both ears, unspecified type ciprodex ear drops. Use four drops in right ear twice daily for next week to ten days.  - ciprofloxacin-dexamethasone  (CIPRODEX) OTIC suspension; Place 4 drops into the right ear 2 (two) times daily.  Dispense: 7.5 mL; Refill: 0  3. Cough Robitussin DM may be taken every six hours as needed for cough.  - guaiFENesin-dextromethorphan (ROBITUSSIN DM) 100-10 MG/5ML syrup; Take 5 mLs by mouth every 4 (four) hours as needed for cough.  Dispense: 236 mL; Refill: 1  4. Yeast infection Diflucan ma ybe taken as needed and as prescribed if yeast infection develops.  - fluconazole (DIFLUCAN) 150 MG tablet; Take 1 tablet (150 mg total) by mouth daily. Take one tablet once and may repeat if symptoms persist in 3 days  Dispense: 3 tablet; Refill: 0  General Counseling: Bryndle verbalizes understanding of the findings of today's phone visit and agrees with plan of treatment. I have discussed any further diagnostic evaluation that may be needed or ordered today. We also reviewed her medications today. she has been encouraged to call the office with any questions or concerns that should arise related to todays visit.   This patient was seen by [place] with Dr Lavera Guise as a part of collaborative care agreement  Meds ordered this encounter  Medications  . ciprofloxacin-dexamethasone (CIPRODEX) OTIC suspension    Sig: Place 4 drops into the right ear 2 (two) times daily.    Dispense:  7.5 mL    Refill:  0    Order Specific Question:   Supervising Provider    Answer:   Lavera Guise [[id number]]  . clarithromycin (BIAXIN) 500 MG tablet    Sig: Take 1 tablet (500 mg total) by mouth 2 (two) times daily.    Dispense:  20 tablet    Refill:  0    Order Specific Question:   Supervising Provider    Answer:   Lavera Guise [[id number]]  . fluconazole (DIFLUCAN) 150 MG tablet    Sig: Take 1 tablet (150 mg total) by mouth daily. Take one tablet once and may repeat if symptoms persist in 3 days    Dispense:  3 tablet    Refill:  0    Order Specific Question:   Supervising Provider    Answer:   Lavera Guise [address]   . guaiFENesin-dextromethorphan (ROBITUSSIN DM) 100-10 MG/5ML syrup    Sig: Take 5 mLs by mouth every 4 (four) hours as needed for cough.    Dispense:  236 mL    Refill:  1    Order Specific Question:   Supervising Provider    Answer:   Lavera Guise [[id number]]    Time spent: 43 Minutes    Dr Lavera Guise Internal medicine

## 2019-03-18 ENCOUNTER — Telehealth: Payer: Self-pay

## 2019-03-18 ENCOUNTER — Encounter: Payer: Self-pay | Admitting: Nurse Practitioner

## 2019-03-18 DIAGNOSIS — H60503 Unspecified acute noninfective otitis externa, bilateral: Secondary | ICD-10-CM | POA: Insufficient documentation

## 2019-03-18 DIAGNOSIS — R059 Cough, unspecified: Secondary | ICD-10-CM | POA: Insufficient documentation

## 2019-03-18 DIAGNOSIS — B379 Candidiasis, unspecified: Secondary | ICD-10-CM | POA: Insufficient documentation

## 2019-03-18 DIAGNOSIS — J069 Acute upper respiratory infection, unspecified: Secondary | ICD-10-CM | POA: Insufficient documentation

## 2019-03-18 DIAGNOSIS — R05 Cough: Secondary | ICD-10-CM | POA: Insufficient documentation

## 2019-03-18 NOTE — Telephone Encounter (Signed)
Called lmom informing patient of appointment. klh 

## 2019-03-19 ENCOUNTER — Ambulatory Visit: Payer: Medicaid Other | Attending: Internal Medicine

## 2019-03-19 DIAGNOSIS — Z20822 Contact with and (suspected) exposure to covid-19: Secondary | ICD-10-CM

## 2019-03-20 ENCOUNTER — Other Ambulatory Visit: Payer: Medicaid Other

## 2019-03-20 ENCOUNTER — Other Ambulatory Visit: Payer: Self-pay

## 2019-03-21 LAB — NOVEL CORONAVIRUS, NAA: SARS-CoV-2, NAA: NOT DETECTED

## 2019-03-24 ENCOUNTER — Other Ambulatory Visit: Payer: Self-pay

## 2019-03-24 ENCOUNTER — Ambulatory Visit
Admission: RE | Admit: 2019-03-24 | Discharge: 2019-03-24 | Disposition: A | Discharge status: Home / Self Care | Payer: Medicaid Other | Source: Ambulatory Visit | Attending: Nurse Practitioner | Admitting: Nurse Practitioner

## 2019-03-24 DIAGNOSIS — M79662 Pain in left lower leg: Secondary | ICD-10-CM | POA: Insufficient documentation

## 2019-03-24 DIAGNOSIS — M7989 Other specified soft tissue disorders: Secondary | ICD-10-CM | POA: Insufficient documentation

## 2019-03-25 ENCOUNTER — Telehealth: Payer: Self-pay

## 2019-03-25 NOTE — Progress Notes (Signed)
No fractures present.

## 2019-03-25 NOTE — Progress Notes (Signed)
Calcaneal spurs present. No other fracture or abnormality noted. Discuss at visit 04/09/2019

## 2019-03-25 NOTE — Telephone Encounter (Signed)
Confirmed appointment with patient and screened for covid. klh 

## 2019-03-27 ENCOUNTER — Other Ambulatory Visit: Payer: Medicaid Other

## 2019-04-01 ENCOUNTER — Other Ambulatory Visit: Payer: Self-pay

## 2019-04-01 ENCOUNTER — Telehealth: Payer: Self-pay

## 2019-04-01 DIAGNOSIS — E039 Hypothyroidism, unspecified: Secondary | ICD-10-CM

## 2019-04-01 MED ORDER — LEVOTHYROXINE SODIUM 75 MCG PO TABS: 75.0000 ug | ORAL_TABLET | Freq: Every day | ORAL | 3 refills | Status: DC

## 2019-04-01 NOTE — Telephone Encounter (Signed)
Confirmed appointment with patient and screened for covid. klh 

## 2019-04-01 NOTE — Telephone Encounter (Signed)
Called lmom informing patient of appointment. klh 

## 2019-04-03 ENCOUNTER — Ambulatory Visit: Payer: Medicaid Other

## 2019-04-03 ENCOUNTER — Other Ambulatory Visit: Payer: Self-pay

## 2019-04-03 VITALS — BP 148/84 | HR 78 | Temp 97.9°F | Wt 203.4 lb

## 2019-04-03 DIAGNOSIS — M79604 Pain in right leg: Secondary | ICD-10-CM

## 2019-04-03 DIAGNOSIS — M79605 Pain in left leg: Secondary | ICD-10-CM | POA: Diagnosis not present

## 2019-04-03 NOTE — Progress Notes (Signed)
Pt was here for ABI, the right brachial number was 170 and right ankle was 185. The left brachial number was 167 and the left ankle was 193. The final calculation for right leg was 1.08 and for the left leg it was 1.13. Pt was advised to keep eye on blood pressure and to keep follow up with Heather. Message was also sent to [facility]

## 2019-04-06 ENCOUNTER — Other Ambulatory Visit: Payer: Self-pay

## 2019-04-06 DIAGNOSIS — M542 Cervicalgia: Secondary | ICD-10-CM

## 2019-04-06 DIAGNOSIS — G8929 Other chronic pain: Secondary | ICD-10-CM

## 2019-04-06 MED ORDER — MELOXICAM 7.5 MG PO TABS: 7.5000 mg | ORAL_TABLET | Freq: Two times a day (BID) | ORAL | 3 refills | Status: DC | PRN

## 2019-04-07 ENCOUNTER — Encounter: Payer: Medicaid Other | Admitting: Nurse Practitioner

## 2019-04-13 ENCOUNTER — Encounter: Payer: Self-pay | Admitting: Orthopedic Surgery

## 2019-04-13 ENCOUNTER — Emergency Department: Payer: Medicaid Other

## 2019-04-13 ENCOUNTER — Other Ambulatory Visit: Payer: Self-pay

## 2019-04-13 ENCOUNTER — Emergency Department
Admission: EM | Admit: 2019-04-13 | Discharge: 2019-04-13 | Disposition: A | Discharge status: Home / Self Care | Payer: Medicaid Other | Attending: Emergency Medicine | Admitting: Emergency Medicine

## 2019-04-13 DIAGNOSIS — M5441 Lumbago with sciatica, right side: Secondary | ICD-10-CM | POA: Insufficient documentation

## 2019-04-13 DIAGNOSIS — R42 Dizziness and giddiness: Secondary | ICD-10-CM | POA: Diagnosis not present

## 2019-04-13 DIAGNOSIS — Y9389 Activity, other specified: Secondary | ICD-10-CM | POA: Insufficient documentation

## 2019-04-13 DIAGNOSIS — Y929 Unspecified place or not applicable: Secondary | ICD-10-CM | POA: Insufficient documentation

## 2019-04-13 DIAGNOSIS — S7001XA Contusion of right hip, initial encounter: Secondary | ICD-10-CM

## 2019-04-13 DIAGNOSIS — Z794 Long term (current) use of insulin: Secondary | ICD-10-CM | POA: Insufficient documentation

## 2019-04-13 DIAGNOSIS — Z79899 Other long term (current) drug therapy: Secondary | ICD-10-CM | POA: Insufficient documentation

## 2019-04-13 DIAGNOSIS — Y999 Unspecified external cause status: Secondary | ICD-10-CM | POA: Insufficient documentation

## 2019-04-13 DIAGNOSIS — M069 Rheumatoid arthritis, unspecified: Secondary | ICD-10-CM | POA: Diagnosis not present

## 2019-04-13 DIAGNOSIS — W01198A Fall on same level from slipping, tripping and stumbling with subsequent striking against other object, initial encounter: Secondary | ICD-10-CM | POA: Diagnosis not present

## 2019-04-13 DIAGNOSIS — E119 Type 2 diabetes mellitus without complications: Secondary | ICD-10-CM | POA: Diagnosis not present

## 2019-04-13 DIAGNOSIS — M545 Low back pain, unspecified: Secondary | ICD-10-CM

## 2019-04-13 DIAGNOSIS — S79911A Unspecified injury of right hip, initial encounter: Secondary | ICD-10-CM | POA: Diagnosis present

## 2019-04-13 MED ORDER — OXYCODONE-ACETAMINOPHEN 5-325 MG PO TABS
1.0000 | ORAL_TABLET | ORAL | Status: AC
  Administered 2019-04-13: 12:00:00 1 via ORAL
  Filled 2019-04-13: qty 1

## 2019-04-13 NOTE — ED Notes (Signed)
Phone provided patient called home and updated her daughter.

## 2019-04-13 NOTE — ED Triage Notes (Signed)
Patient reports mechanical fall( tripped on a balloon at home), c/o of pain to right hip and right knee. CMS intact, patient was not able to bare wt at home. n

## 2019-04-13 NOTE — ED Provider Notes (Signed)
[facility] Emergency Department Provider Note   ____________________________________________   First MD Initiated Contact with Patient 04/13/19 1131     (approximate)  I have reviewed the triage vital signs and the nursing notes.   HISTORY  Chief Complaint Hip Pain and Knee Pain    HPI Holly Espinoza is a 59 y.o. female   She reports she was playing with balloons with her grandchild when she slipped and fell.  She fell forward, reports it was an awkward direction, she struck her forehead on the ground and felt dazed briefly.  She did not lose consciousness.  She was not sick or had any preceding symptoms, reports she simply tripped somehow like a balloon of the balloon string while playing  She also fell onto her right side, she is having a moderate amount of pain especially around her right hip and right lower back area.  She has chronic neck back pain issues.  She takes prescription pain medications for this, and reports previous surgeries on her neck and lumbar spine.  Denies pain in her mid back or thoracic region.  No chest pain no trouble breathing.  No recent illness.  No exposure to Covid.   Past Medical History:  Diagnosis Date  . Anxiety   . Arthritis    "pretty much from my neck to my feet" (07/08/2012)  . B12 deficiency anemia   . Chronic lower back pain   . Depression   . Diabetic peripheral neuropathy ([place])   . Gout   . High cholesterol   . 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 Neuromuscular disorder ([place])   . Rheumatoid arthritis ([place])   . Type II diabetes mellitus (HCC)    recent hgb A1C was 5.4 per Dr. Humphrey Rolls, [place], [place]    Patient Active Problem List   Diagnosis Date Noted  . Acute upper respiratory infection 03/18/2019  . Acute otitis externa of both ears 03/18/2019  . Cough 03/18/2019  . Yeast infection 03/18/2019  . Pain and swelling of left lower leg 03/08/2019  . Bilateral leg and foot pain  02/19/2019  . Cellulitis and abscess of leg 02/19/2019  . Chronic pain of both shoulders 08/28/2018  . Dyspnea on exertion 07/24/2018  . Varicose veins of leg with edema, bilateral 07/24/2018  . Chronic gout without tophus 04/09/2018  . Post traumatic stress disorder (PTSD) 04/09/2018  . Right flank pain 04/09/2018  . Fracture of radial head, closed 02/26/2018  . Olecranon bursitis 02/26/2018  . Non-recurrent acute suppurative otitis media of right ear without spontaneous rupture of tympanic membrane 02/26/2018  . Encounter for general adult medical examination with abnormal findings 12/02/2017  . Bacterial vaginitis 12/02/2017  . Screening for malignant neoplasm of cervix 12/02/2017  . Edema 09/15/2017  . Uncontrolled type 2 diabetes mellitus with hyperglycemia ([place]) 09/15/2017  . Urinary tract infection with hematuria 07/09/2017  . Bladder prolapse, female, acquired 07/09/2017  . Vaginal candidiasis 07/09/2017  . Generalized anxiety disorder with panic attacks 07/09/2017  . Dysuria 07/09/2017  . Acute left ankle pain 06/08/2017  . Gastroesophageal reflux disease without esophagitis 06/08/2017  . Moderate episode of recurrent major depressive disorder ([place]) 06/08/2017  . Acute non-recurrent pansinusitis 04/16/2017  . Flu-like symptoms 04/16/2017  . Cervicalgia 04/16/2017  . Impingement syndrome of shoulder region 05/08/2016  . Rectocele 11/01/2015  . Arthritis 08/02/2015  . Controlled type 2 diabetes mellitus without complication ([facility]) 22/29/7989  . Neuralgia neuritis, sciatic nerve 08/02/2015  .  Vitamin B12 deficiency 08/02/2015  . Lobular carcinoma in situ of breast 02/16/2013  . Post menopausal syndrome 09/24/2012  . Postmenopausal 09/24/2012  . Absolute anemia 09/18/2012  . Leg edema, left 09/03/2012  . Protein-calorie malnutrition, severe ([place]) 08/15/2012  . Back ache 01/14/2012  . Diabetes mellitus without complication ([place]) 19/50/9326  . HLD (hyperlipidemia)  01/14/2012  . BP (high blood pressure) 01/14/2012  . Needs flu shot 01/14/2012  . Diabetic peripheral neuropathy associated with type 2 diabetes mellitus ([place]) 10/12/2011  . Abnormal kidney function study 06/28/2011  . Benign neoplasm of soft tissues 05/31/2011  . Skin mole 05/31/2011    Past Surgical History:  Procedure Laterality Date  . ABDOMINAL HYSTERECTOMY  2001?  . ANTERIOR AND POSTERIOR REPAIR N/A 11/01/2015   Procedure: CYSTOSCOPY, ANTERIOR (CYSTOCELE) AND POSTERIOR REPAIR (RECTOCELE);  Surgeon: Bjorn Loser, MD;  Location: [facility];  Service: Urology;  Laterality: N/A;  . ANTERIOR CERVICAL DECOMP/DISCECTOMY FUSION  2012  . BACK SURGERY  2001   laminectomy  . BREAST BIOPSY Left 2014  . BREAST LUMPECTOMY Left 02/25/2013  . BREAST SURGERY Left 02/25/13   lumpectomy  . CHOLECYSTECTOMY  1980's  . DILATION AND CURETTAGE OF UTERUS  [phone]   "probably 3" (07/08/2012)  . FRACTURE SURGERY     C4-7 Surgery for fracture  . LUMBAR WOUND DEBRIDEMENT N/A 08/12/2012   Procedure: LUMBAR WOUND DEBRIDEMENT;  Surgeon: Faythe Ghee, MD;  Location: [facility];  Service: Neurosurgery;  Laterality: N/A;  Lumbar Wound Debridement  . POSTERIOR LUMBAR FUSION  07/08/2012  . TONSILLECTOMY  ~ 1968    Prior to Admission medications   Medication Sig Start Date End Date Taking? Authorizing Provider  allopurinol (ZYLOPRIM) 100 MG tablet Take 2 tab po at bedtime 02/19/19   Ronnell Freshwater, NP  aspirin 81 MG chewable tablet Chew 81 mg by mouth daily.    [provider]  ciprofloxacin-dexamethasone (CIPRODEX) OTIC suspension Place 4 drops into the right ear 2 (two) times daily. 03/12/19   Ronnell Freshwater, NP  clarithromycin (BIAXIN) 500 MG tablet Take 1 tablet (500 mg total) by mouth 2 (two) times daily. 03/12/19   Ronnell Freshwater, NP  Colchicine (MITIGARE) 0.6 MG CAPS Take 1.2 mg by mouth See admin instructions. At onset of gout flare. May repeat one additional capsule 1 hour  later if symptoms persist. 06/13/17   [provider]  Cyanocobalamin (B-12) 1000 MCG/ML KIT Inject 1,000 mcg as directed every 30 (thirty) days. 04/08/18   Ronnell Freshwater, NP  diazepam (VALIUM) 5 MG tablet Take 1/2 to 1 tablet po BID PRN only. 02/26/19   Ronnell Freshwater, NP  DULoxetine (CYMBALTA) 60 MG capsule Take 1 capsule (60 mg total) by mouth daily. 02/26/19   Ronnell Freshwater, NP  fexofenadine (ALLEGRA) 180 MG tablet Take 180 mg by mouth daily.    [provider]  fluconazole (DIFLUCAN) 150 MG tablet Take 1 tablet (150 mg total) by mouth daily. Take one tablet once and may repeat if symptoms persist in 3 days 03/12/19   Ronnell Freshwater, NP  furosemide (LASIX) 20 MG tablet Take 1 tablet (20 mg total) by mouth daily as needed for fluid or edema. 10/02/18   Ronnell Freshwater, NP  gemfibrozil (LOPID) 600 MG tablet Take 1 tablet (600 mg total) by mouth 2 (two) times daily before a meal. 02/26/19   Boscia, Heather E, NP  guaiFENesin-dextromethorphan (ROBITUSSIN DM) 100-10 MG/5ML syrup Take 5 mLs by mouth every 4 (  four) hours as needed for cough. 03/12/19   Ronnell Freshwater, NP  hydrochlorothiazide (HYDRODIURIL) 25 MG tablet Take 1 tablet (25 mg total) by mouth daily. 02/26/19   Ronnell Freshwater, NP  Insulin Pen Needle (PEN NEEDLES 5/16") 30G X 8 MM MISC To use every day with victoza injections - E11.65 04/08/18   Ronnell Freshwater, NP  levothyroxine (SYNTHROID) 75 MCG tablet Take 1 tablet (75 mcg total) by mouth daily before breakfast. 04/01/19   Ronnell Freshwater, NP  liraglutide (VICTOZA) 18 MG/3ML SOPN Inject  1.8 mg [place]  daily 12/10/18   Ronnell Freshwater, NP  meloxicam (MOBIC) 7.5 MG tablet Take 1 tablet (7.5 mg total) by mouth 2 (two) times daily as needed for pain. 04/06/19   Ronnell Freshwater, NP  mirtazapine (REMERON) 15 MG tablet Take 1 tablet (15 mg total) by mouth at bedtime. 06/09/18   Ronnell Freshwater, NP  mupirocin ointment (BACTROBAN) 2 % Place 1 application into  the nose 2 (two) times daily. 06/17/18   Kendell Bane, NP  nystatin ointment (MYCOSTATIN) Apply 1 application topically 3 (three) times daily. 02/26/18   Ronnell Freshwater, NP  omeprazole (PRILOSEC) 40 MG capsule Take 1 capsule (40 mg total) by mouth daily. 02/19/19   Ronnell Freshwater, NP  ondansetron (ZOFRAN) 4 MG tablet Take 4 mg by mouth every 8 (eight) hours as needed for nausea. For nausea    [provider]  predniSONE (STERAPRED UNI-PAK 48 TAB) 10 MG (48) TBPK tablet 12 day taper - take by mouth as directed for 12 days 02/26/19   Ronnell Freshwater, NP  pregabalin (LYRICA) 50 MG capsule Take 50 mg by mouth 2 (two) times daily.    [provider]  sulfamethoxazole-trimethoprim (BACTRIM DS) 800-160 MG tablet Take 1 tablet by mouth 2 (two) times daily. 02/26/19   Ronnell Freshwater, NP  Syringe/Needle, Disp, (SYRINGE 3CC/20GX1") 20G X 1" 3 ML MISC To sue for B12 injections once monthly 04/08/18   Ronnell Freshwater, NP  topiramate (TOPAMAX) 100 MG tablet Take 100 mg by mouth 2 (two) times daily.    [provider]    Allergies Patient has no known allergies.  Family History  Problem Relation Age of Onset  . Kidney disease Father   . Cancer Other        breast great grandmother  . Breast cancer Other     Social History Social History   Tobacco Use  . Smoking status: Current Every Day Smoker    Packs/day: 0.50    Years: 40.00    Pack years: 20.00    Types: Cigarettes  . Smokeless tobacco: Never Used  Substance Use Topics  . Alcohol use: No  . Drug use: No    Types: Marijuana, Cocaine    Comment: 07/08/2012 "last drug use >8 yr ago" (07/08/2012)    Review of Systems Constitutional: No fever/chills Eyes: No visual changes. ENT: No sore throat. Cardiovascular: Denies chest pain. Respiratory: Denies shortness of breath. Gastrointestinal: No abdominal pain.   Genitourinary: Negative for dysuria. Musculoskeletal: Negative for back pain. Skin:  Negative for rash. Neurological: Negative for headaches, areas of focal weakness or numbness.    ____________________________________________   PHYSICAL EXAM:  VITAL SIGNS: ED Triage Vitals  Enc Vitals Group     BP 04/13/19 1122 (!) 151/83     Pulse Rate 04/13/19 1122 64     Resp 04/13/19 1122 17     Temp 04/13/19 1122  98.5 F (36.9 C)     Temp Source 04/13/19 1122 Oral     SpO2 04/13/19 1122 98 %     Weight 04/13/19 1125 200 lb (90.7 kg)     Height 04/13/19 1125 5' 8"  (1.727 m)     Head Circumference --      Peak Flow --      Pain Score 04/13/19 1124 5     Pain Loc --      Pain Edu? --      Excl. in [place]? --     Constitutional: Alert and oriented. Well appearing and in no acute distress.  Very pleasant. Eyes: Conjunctivae are normal. Head: Atraumatic. Nose: No congestion/rhinnorhea. Mouth/Throat: Mucous membranes are moist. Neck: No stridor.  Cardiovascular: Normal rate, regular rhythm. Grossly normal heart sounds.  Good peripheral circulation. Respiratory: Normal respiratory effort.  No retractions. Lungs CTAB. Gastrointestinal: Soft and nontender. No distention. Musculoskeletal: Mild midline cervical tenderness, reports somewhat chronic in nature but also a little bit worse than normal.  No thoracic tenderness or deformities.  Reports moderate mid lower lumbar to right lumbosacral region tenderness to palpation.  RIGHT Right upper extremity demonstrates normal strength, good use of all muscles. No edema bruising or contusions of the right shoulder/upper arm, right elbow, right forearm / hand. Full range of motion of the right right upper extremity without pain. No evidence of trauma. Strong radial pulse. Intact median/ulnar/radial neuro-muscular exam.  LEFT Left upper extremity demonstrates normal strength, good use of all muscles. No edema bruising or contusions of the left shoulder/upper arm, left elbow, left forearm / hand. Full range of motion of the left  upper  extremity without pain. No evidence of trauma. Strong radial pulse. Intact median/ulnar/radial neuro-muscular exam.  Lower Extremities  No edema. Normal DP/PT pulses bilateral with good cap refill.  Normal neuro-motor function lower extremities bilateral.  RIGHT Right lower extremity demonstrates normal strength, good use of all muscles except some limitation due to pain that she reports in her right lumbosacral region also some tenderness over the right hip. No edema bruising or contusions of the right hip, right knee, right ankle.  Somewhat limited range of motion of the right lower extremity with pain in her lower back. No pain on axial loading.  No shortening or rotation. LEFT Left lower extremity demonstrates normal strength, good use of all muscles. No edema bruising or contusions of the hip,  knee, ankle. Full range of motion of the left lower extremity without pain. No pain on axial loading. No evidence of trauma.   Neurologic:  Normal speech and language. No gross focal neurologic deficits are appreciated.  Skin:  Skin is warm, dry and intact. No rash noted. Psychiatric: Mood and affect are normal. Speech and behavior are normal.  ____________________________________________   LABS (all labs ordered are listed, but only abnormal results are displayed)  Labs Reviewed - No data to display ____________________________________________  EKG   ____________________________________________  RADIOLOGY  DG Lumbar Spine 2-3 Views  Result Date: 04/13/2019 CLINICAL DATA:  Mechanical fall at home with right low back and right hip pain. EXAM: LUMBAR SPINE - 2-3 VIEW COMPARISON:  12/27/2014 lumbar spine radiograph FINDINGS: This report assumes 5 non rib-bearing lumbar vertebrae. Bilateral posterior spinal fusion hardware L3-L5 with pedicle screws at each level, with no evidence of hardware fracture or loosening. Lumbar vertebral body heights are preserved, with no fracture. Bone cages in  place at L3-4 and L4-5. Mild-to-moderate degenerative disc disease at T12-L1, L1-2 and L2-3,  similar. Stable minimal 2 mm retrolisthesis at L1-2. Stable mild 3 mm retrolisthesis at L3-4. no aggressive appearing focal osseous lesions. Cholecystectomy clips are seen in the right upper quadrant of the abdomen. IMPRESSION: 1. No lumbar spine fracture or acute malalignment. 2. Bilateral posterior spinal fusion L3-5 without evidence of hardware complication. 3. Similar multilevel lumbar degenerative disc disease and mild multilevel lumbar retrolisthesis as detailed. Electronically Signed   By: Ilona Sorrel M.D.   On: 04/13/2019 12:32   CT Head Wo Contrast  Result Date: 04/13/2019 CLINICAL DATA:  The patient tripped over a balloon and fell today. Initial encounter. EXAM: CT HEAD WITHOUT CONTRAST CT CERVICAL SPINE WITHOUT CONTRAST TECHNIQUE: Multidetector CT imaging of the head and cervical spine was performed following the standard protocol without intravenous contrast. Multiplanar CT image reconstructions of the cervical spine were also generated. COMPARISON:  None. FINDINGS: CT HEAD FINDINGS Brain: No evidence of acute infarction, hemorrhage, hydrocephalus, extra-axial collection or mass lesion/mass effect. Vascular: No hyperdense vessel or unexpected calcification. Skull: Normal. Negative for fracture or focal lesion. Sinuses/Orbits: Negative. Other: None. CT CERVICAL SPINE FINDINGS Alignment: 0.2 cm facet mediated anterolisthesis C3 on C4 noted. No traumatic listhesis. Skull base and vertebrae: No acute fracture. No primary bone lesion or focal pathologic process. The patient is status post C4-7 fusion. Soft tissues and spinal canal: No prevertebral fluid or swelling. No visible canal hematoma. Disc levels: Residual endplate spurring at the fusion levels is worst at C5-6 and C6-7. Multilevel facet degenerative disease appears worst on the left at C2-3 and on the right at C3-4. The appearance is not markedly changed.  Upper chest: Lung apices clear. Other: None. IMPRESSION: Negative head CT. No acute abnormality cervical spine. Status post C4-7 fusion. Multilevel spondylosis does not appear changed. Electronically Signed   By: Inge Rise M.D.   On: 04/13/2019 12:17   CT Cervical Spine Wo Contrast  Result Date: 04/13/2019 CLINICAL DATA:  The patient tripped over a balloon and fell today. Initial encounter. EXAM: CT HEAD WITHOUT CONTRAST CT CERVICAL SPINE WITHOUT CONTRAST TECHNIQUE: Multidetector CT imaging of the head and cervical spine was performed following the standard protocol without intravenous contrast. Multiplanar CT image reconstructions of the cervical spine were also generated. COMPARISON:  None. FINDINGS: CT HEAD FINDINGS Brain: No evidence of acute infarction, hemorrhage, hydrocephalus, extra-axial collection or mass lesion/mass effect. Vascular: No hyperdense vessel or unexpected calcification. Skull: Normal. Negative for fracture or focal lesion. Sinuses/Orbits: Negative. Other: None. CT CERVICAL SPINE FINDINGS Alignment: 0.2 cm facet mediated anterolisthesis C3 on C4 noted. No traumatic listhesis. Skull base and vertebrae: No acute fracture. No primary bone lesion or focal pathologic process. The patient is status post C4-7 fusion. Soft tissues and spinal canal: No prevertebral fluid or swelling. No visible canal hematoma. Disc levels: Residual endplate spurring at the fusion levels is worst at C5-6 and C6-7. Multilevel facet degenerative disease appears worst on the left at C2-3 and on the right at C3-4. The appearance is not markedly changed. Upper chest: Lung apices clear. Other: None. IMPRESSION: Negative head CT. No acute abnormality cervical spine. Status post C4-7 fusion. Multilevel spondylosis does not appear changed. Electronically Signed   By: Inge Rise M.D.   On: 04/13/2019 12:17   DG Hip Unilat W or Wo Pelvis 2-3 Views Right  Result Date: 04/13/2019 CLINICAL DATA:  Mechanical fall at  home with right hip and right low back pain. EXAM: DG HIP (WITH OR WITHOUT PELVIS) 2-3V RIGHT COMPARISON:  None. FINDINGS: No pelvic  fracture or diastasis. No right hip fracture or dislocation. Mild hypertrophic change in the right hip joint. No focal osseous lesions. Partially visualized surgical fixation hardware in the lower lumbar spine. IMPRESSION: No fracture. No right hip dislocation. Mild right hip osteoarthritis. Electronically Signed   By: Ilona Sorrel M.D.   On: 04/13/2019 12:35     Imaging studies reviewed, reassuring for no acute injuries noted.  Chronic findings. ____________________________________________   PROCEDURES  Procedure(s) performed: None  Procedures  Critical Care performed: No  ____________________________________________   INITIAL IMPRESSION / ASSESSMENT AND PLAN / ED COURSE  Pertinent labs & imaging results that were available during my care of the patient were reviewed by me and considered in my medical decision making (see chart for details).   Patient presents after reported mechanical fall.  She does report striking her head quite hard, and has chronic neck pain but is complaining also of some neck pain but reports much of this is chronic.  Obtain CT imaging to evaluate for acute injury.  She has no evidence of acute neurologic deficit, except some difficulty with use of the right lower extremity especially with hip flexion extension which seems to refer pain to her right lower lumbosacral region.  There is no obvious bruising or hematoma.  She does report some tenderness across his region as well.  Clinical Course as of Apr 12 1341  Mon Apr 13, 2019  1335 Reassessed the patient, she reports she is feeling improved.  Still feeling sore across her right lower leg right lower back.  However, she would like to attempt ambulation as her pain is improved now.   [MQ]    Clinical Course User Index [MQ] Delman Kitten, MD     ----------------------------------------- 1:43 PM on 04/13/2019 -----------------------------------------  Patient up, ambulating independently without assistance at this point back-and-forth with commode.  She feels improved, but sore.  Return precautions and treatment recommendations and follow-up discussed with the patient who is agreeable with the plan.  ____________________________________________   FINAL CLINICAL IMPRESSION(S) / ED DIAGNOSES  Final diagnoses:  Acute right-sided low back pain without sciatica  Contusion of right hip, initial encounter        Note:  This document was prepared using Dragon voice recognition software and may include unintentional dictation errors       Delman Kitten, MD 04/13/19 1344

## 2019-04-13 NOTE — Discharge Instructions (Addendum)
You have been seen in the Emergency Department (ED) today for a fall.  Your work up does not show any concerning injuries.    No driving this afternoon.  Please follow up with your doctor regarding today's Emergency Department (ED) visit and your recent fall.    Return to the ED if you have any headache, confusion, slurred speech, weakness/numbness of any arm or leg, or any increased pain.

## 2019-04-15 ENCOUNTER — Telehealth: Payer: Self-pay

## 2019-04-15 NOTE — Telephone Encounter (Signed)
Called lmom informing patient of [place] on 04/17/2019. klh

## 2019-04-17 ENCOUNTER — Ambulatory Visit: Payer: Medicaid Other

## 2019-04-17 ENCOUNTER — Other Ambulatory Visit: Payer: Self-pay

## 2019-04-17 DIAGNOSIS — M79662 Pain in left lower leg: Secondary | ICD-10-CM

## 2019-04-17 DIAGNOSIS — R0609 Other forms of dyspnea: Secondary | ICD-10-CM

## 2019-04-17 DIAGNOSIS — R0602 Shortness of breath: Secondary | ICD-10-CM | POA: Diagnosis not present

## 2019-04-17 DIAGNOSIS — R0600 Dyspnea, unspecified: Secondary | ICD-10-CM

## 2019-04-17 DIAGNOSIS — M1A9XX0 Chronic gout, unspecified, without tophus (tophi): Secondary | ICD-10-CM

## 2019-04-17 DIAGNOSIS — M7989 Other specified soft tissue disorders: Secondary | ICD-10-CM

## 2019-04-17 MED ORDER — ALLOPURINOL 100 MG PO TABS: ORAL_TABLET | ORAL | 3 refills | Status: DC

## 2019-04-24 ENCOUNTER — Encounter: Payer: Medicaid Other | Admitting: Nurse Practitioner

## 2019-04-24 ENCOUNTER — Encounter: Payer: Self-pay | Admitting: Nurse Practitioner

## 2019-04-24 ENCOUNTER — Ambulatory Visit: Payer: Medicaid Other | Admitting: Nurse Practitioner

## 2019-04-24 ENCOUNTER — Other Ambulatory Visit: Payer: Self-pay

## 2019-04-24 ENCOUNTER — Ambulatory Visit: Payer: Medicaid Other

## 2019-04-24 VITALS — BP 153/79 | HR 72 | Temp 97.0°F | Resp 16 | Ht 68.5 in | Wt 204.4 lb

## 2019-04-24 DIAGNOSIS — M7989 Other specified soft tissue disorders: Secondary | ICD-10-CM

## 2019-04-24 DIAGNOSIS — M5417 Radiculopathy, lumbosacral region: Secondary | ICD-10-CM | POA: Diagnosis not present

## 2019-04-24 DIAGNOSIS — I5189 Other ill-defined heart diseases: Secondary | ICD-10-CM | POA: Insufficient documentation

## 2019-04-24 DIAGNOSIS — W19XXXD Unspecified fall, subsequent encounter: Secondary | ICD-10-CM

## 2019-04-24 DIAGNOSIS — Z23 Encounter for immunization: Secondary | ICD-10-CM | POA: Diagnosis not present

## 2019-04-24 DIAGNOSIS — M79662 Pain in left lower leg: Secondary | ICD-10-CM

## 2019-04-24 DIAGNOSIS — E119 Type 2 diabetes mellitus without complications: Secondary | ICD-10-CM

## 2019-04-24 NOTE — Progress Notes (Signed)
[facility] [place], [place] [address]  Internal MEDICINE  Office Visit Note  Patient Name: Holly Espinoza  [id number]  [id number]  Date of Service: 04/24/2019  Chief Complaint  Patient presents with  . Follow-up     right leg pain,new pain on her right side of lower back that goes all the way to her right foot  . Depression  . Diabetes  . Hyperlipidemia    The patient is here for follow up of ER visit. She fell at home while playing with her granddaughter at home. Tried to step on helium balloon to pop it, and both legs went out from under her. [place] in a very awkward way. She was unable to get up after the fall. She had to call EMS to help her get up. Thought her right hip was broken. Was unable to standing on her right leg at all. X-ray in the ER was negative to fracture or dislocation or fracture. 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 She does have moderate lumbosacral disc disease at multiple spinal levels. Cervical spine showed intact spinal fusion. No acute abnormalities in the brain. After about 5 hours in ER, she did try to stand up and walk. Was able to apply a little bit of weight on her right leg. Patient does have appointment with her neurosurgeon on 05/07/2019. She should have MRI of the lumbar spine for further evaluation prior to this visit . She did have ABI since her last visit here. Circulation intact, a little less robust on right side than left, though both sides were normal. She is scheduled for arterial duplex of both lower extremities later today. She has also had echo. She has normal LVF with trace valvular regurgitation. She has diastolic dysfunction.       Current Medication: Outpatient Encounter Medications as of 04/24/2019  Medication Sig  . allopurinol (ZYLOPRIM) 100 MG tablet Take 2 tab po at bedtime  . aspirin 81 MG chewable tablet Chew 81 mg by mouth daily.  . ciprofloxacin-dexamethasone (CIPRODEX) OTIC suspension Place 4 drops into  the right ear 2 (two) times daily.  . clarithromycin (BIAXIN) 500 MG tablet Take 1 tablet (500 mg total) by mouth 2 (two) times daily.  . Colchicine (MITIGARE) 0.6 MG CAPS Take 1.2 mg by mouth See admin instructions. At onset of gout flare. May repeat one additional capsule 1 hour later if symptoms persist.  . Cyanocobalamin (B-12) 1000 MCG/ML KIT Inject 1,000 mcg as directed every 30 (thirty) days.  . diazepam (VALIUM) 5 MG tablet Take 1/2 to 1 tablet po BID PRN only.  . DULoxetine (CYMBALTA) 60 MG capsule Take 1 capsule (60 mg total) by mouth daily.  . fexofenadine (ALLEGRA) 180 MG tablet Take 180 mg by mouth daily.  . fluconazole (DIFLUCAN) 150 MG tablet Take 1 tablet (150 mg total) by mouth daily. Take one tablet once and may repeat if symptoms persist in 3 days  . furosemide (LASIX) 20 MG tablet Take 1 tablet (20 mg total) by mouth daily as needed for fluid or edema.  Marland Kitchen gemfibrozil (LOPID) 600 MG tablet Take 1 tablet (600 mg total) by mouth 2 (two) times daily before a meal.  . guaiFENesin-dextromethorphan (ROBITUSSIN DM) 100-10 MG/5ML syrup Take 5 mLs by mouth every 4 (four) hours as needed for cough.  . hydrochlorothiazide (HYDRODIURIL) 25 MG tablet Take 1 tablet (25 mg total) by mouth daily.  . Insulin Pen Needle (PEN NEEDLES 5/16") 30G X 8 MM MISC To use  every day with victoza injections - E11.65  . levothyroxine (SYNTHROID) 75 MCG tablet Take 1 tablet (75 mcg total) by mouth daily before breakfast.  . liraglutide (VICTOZA) 18 MG/3ML SOPN Inject  1.8 mg [place]  daily  . meloxicam (MOBIC) 7.5 MG tablet Take 1 tablet (7.5 mg total) by mouth 2 (two) times daily as needed for pain.  . mirtazapine (REMERON) 15 MG tablet Take 1 tablet (15 mg total) by mouth at bedtime.  . mupirocin ointment (BACTROBAN) 2 % Place 1 application into the nose 2 (two) times daily.  Marland Kitchen nystatin ointment (MYCOSTATIN) Apply 1 application topically 3 (three) times daily.  Marland Kitchen omeprazole (PRILOSEC) 40 MG capsule Take 1 capsule  (40 mg total) by mouth daily.  . ondansetron (ZOFRAN) 4 MG tablet Take 4 mg by mouth every 8 (eight) hours as needed for nausea. For nausea  . predniSONE (STERAPRED UNI-PAK 48 TAB) 10 MG (48) TBPK tablet 12 day taper - take by mouth as directed for 12 days  . pregabalin (LYRICA) 50 MG capsule Take 50 mg by mouth 2 (two) times daily.  Marland Kitchen sulfamethoxazole-trimethoprim (BACTRIM DS) 800-160 MG tablet Take 1 tablet by mouth 2 (two) times daily.  . Syringe/Needle, Disp, (SYRINGE 3CC/20GX1") 20G X 1" 3 ML MISC To sue for B12 injections once monthly  . topiramate (TOPAMAX) 100 MG tablet Take 100 mg by mouth 2 (two) times daily.   No facility-administered encounter medications on file as of 04/24/2019.    Surgical History: Past Surgical History:  Procedure Laterality Date  . ABDOMINAL HYSTERECTOMY  2001?  . ANTERIOR AND POSTERIOR REPAIR N/A 11/01/2015   Procedure: CYSTOSCOPY, ANTERIOR (CYSTOCELE) AND POSTERIOR REPAIR (RECTOCELE);  Surgeon: Bjorn Loser, MD;  Location: [facility];  Service: Urology;  Laterality: N/A;  . ANTERIOR CERVICAL DECOMP/DISCECTOMY FUSION  2012  . BACK SURGERY  2001   laminectomy  . BREAST BIOPSY Left 2014  . BREAST LUMPECTOMY Left 02/25/2013  . BREAST SURGERY Left 02/25/13   lumpectomy  . CHOLECYSTECTOMY  1980's  . DILATION AND CURETTAGE OF UTERUS  [phone]   "probably 3" (07/08/2012)  . FRACTURE SURGERY     C4-7 Surgery for fracture  . LUMBAR WOUND DEBRIDEMENT N/A 08/12/2012   Procedure: LUMBAR WOUND DEBRIDEMENT;  Surgeon: Faythe Ghee, MD;  Location: [facility];  Service: Neurosurgery;  Laterality: N/A;  Lumbar Wound Debridement  . POSTERIOR LUMBAR FUSION  07/08/2012  . TONSILLECTOMY  ~ 1968    Medical History: Past Medical History:  Diagnosis Date  . Anxiety   . Arthritis    "pretty much from my neck to my feet" (07/08/2012)  . B12 deficiency anemia   . Chronic lower back pain   . Depression   . Diabetic peripheral neuropathy ([place])   . Gout   . High  cholesterol   . 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 Neuromuscular disorder ([place])   . Rheumatoid arthritis ([place])   . Type II diabetes mellitus (H[facility])    recent hgb A1C was 5.4 per Dr. Humphrey Rolls, [place], [place]    Family History: Family History  Problem Relation Age of Onset  . Kidney disease Father   . Cancer Other        breast great grandmother  . Breast cancer Other     Social History   Socioeconomic History  . Marital status: Single    Spouse name: Not on file  . Number of children: Not on file  . Years of education: Not  on file  . Highest education level: Not on file  Occupational History  . Not on file  Tobacco Use  . Smoking status: Current Every Day Smoker    Packs/day: 0.50    Years: 40.00    Pack years: 20.00    Types: Cigarettes  . Smokeless tobacco: Never Used  Substance and Sexual Activity  . Alcohol use: No  . Drug use: No    Types: Marijuana, Cocaine    Comment: 07/08/2012 "last drug use >8 yr ago" (07/08/2012)  . Sexual activity: Yes    Comment: quit a long time ago  Other Topics Concern  . Not on file  Social History Narrative  . Not on file   Social Determinants of Health   Financial Resource Strain:   . Difficulty of Paying Living Expenses: Not on file  Food Insecurity:   . Worried About Charity fundraiser in the Last Year: Not on file  . Ran Out of Food in the Last Year: Not on file  Transportation Needs:   . Lack of Transportation (Medical): Not on file  . Lack of Transportation (Non-Medical): Not on file  Physical Activity:   . Days of Exercise per Week: Not on file  . Minutes of Exercise per Session: Not on file  Stress:   . Feeling of Stress : Not on file  Social Connections:   . Frequency of Communication with Friends and Family: Not on file  . Frequency of Social Gatherings with Friends and Family: Not on file  . Attends Religious Services: Not on file  . Active Member of Clubs or Organizations: Not on file   . Attends Archivist Meetings: Not on file  . Marital Status: Not on file  Intimate Partner Violence:   . Fear of Current or Ex-Partner: Not on file  . Emotionally Abused: Not on file  . Physically Abused: Not on file  . Sexually Abused: Not on file      Review of Systems  Constitutional: Positive for activity change and fatigue. Negative for chills and unexpected weight change.       More severe right leg pain since fall. More difficult to apply weight on right leg.   HENT: Negative for congestion, postnasal drip, rhinorrhea, sneezing and sore throat.   Respiratory: Negative for cough, chest tightness, shortness of breath and wheezing.   Cardiovascular: Positive for leg swelling. Negative for chest pain and palpitations.       Very mild improvement in the swelling and pain in both lower legs. Still very painful to put weight on her legs and to walk. Difficult to put her shoes on as her feet are so swollen.   Gastrointestinal: Negative for abdominal pain, constipation, diarrhea, nausea and vomiting.  Endocrine:       Blood sugars doing well   Musculoskeletal: Positive for arthralgias, back pain, gait problem, joint swelling and myalgias. Negative for neck pain.       Severe right leg pain after fall in the home.   Skin: Negative for rash.  Allergic/Immunologic: Negative for environmental allergies.  Neurological: Positive for weakness. Negative for tremors and numbness.  Hematological: Negative for adenopathy. Does not bruise/bleed easily.  Psychiatric/Behavioral: Negative for behavioral problems (Depression), sleep disturbance and suicidal ideas. The patient is nervous/anxious.     Today's Vitals   04/24/19 0848  BP: (!) 153/79  Pulse: 72  Resp: 16  Temp: (!) 97 F (36.1 C)  SpO2: 99%  Weight: 204 lb 6.4 oz (  92.7 kg)  Height: 5' 8.5" (1.74 m)   Body mass index is 30.63 kg/m.  Physical Exam Vitals and nursing note reviewed.  Constitutional:      General: She  is in acute distress.     Appearance: She is well-developed. She is not diaphoretic.  HENT:     Head: Normocephalic and atraumatic.     Mouth/Throat:     Pharynx: No oropharyngeal exudate.  Eyes:     Pupils: Pupils are equal, round, and reactive to light.  Neck:     Thyroid: No thyromegaly.     Vascular: No JVD.     Trachea: No tracheal deviation.  Cardiovascular:     Rate and Rhythm: Normal rate and regular rhythm.     Heart sounds: Normal heart sounds. No murmur. No friction rub. No gallop.   Pulmonary:     Effort: Pulmonary effort is normal. No respiratory distress.     Breath sounds: Normal breath sounds. No wheezing or rales.  Chest:     Chest wall: No tenderness.  Abdominal:     General: Bowel sounds are normal.     Palpations: Abdomen is soft.  Musculoskeletal:     Cervical back: Neck supple. Spinous process tenderness and muscular tenderness present. Decreased range of motion.  Lymphadenopathy:     Cervical: No cervical adenopathy.  Skin:    General: Skin is warm and dry.  Neurological:     Mental Status: She is alert and oriented to person, place, and time.     Cranial Nerves: No cranial nerve deficit.  Psychiatric:        Behavior: Behavior normal.        Thought Content: Thought content normal.        Judgment: Judgment normal.    Assessment/Plan:  1. Fall at home, subsequent encounter Reviewed records from most recent ER visit. Suffered fall at home. X-ray of her lumbar spine indicates moderate lumbosacral disc disease. Will get MRI of lumbar spine for further evaluation. She should keep scheduled appointment with neurosurgeon.  - MR Lumbar Spine Wo Contrast; Future  2. 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 Suffered fall at home. X-ray of her lumbar spine indicates moderate lumbosacral disc disease. Will get MRI of lumbar spine for further evaluation. She should keep scheduled appointment with  neurosurgeon.  - MR Lumbar Spine Wo Contrast; Future  3. Diastolic dysfunction Reviewed echocardiogram results with the patient, showing diastolic dysfunction. Will get home sleep study for further evaluation of sleep apnea and nocturnal hypoxia.  - Home sleep test  4. Flu vaccine need Flu vaccine administered today.  - Flu Vaccine MDCK QUAD PF  5. Type 2 diabetes mellitus without complication, without long-term current use of insulin ([place]) Continue diabetic medication as prescribed   General Counseling: aleeha boline understanding of the findings of todays visit and agrees with plan of treatment. I have discussed any further diagnostic evaluation that may be needed or ordered today. We also reviewed her medications today. she has been encouraged to call the office with any questions or concerns that should arise related to todays visit.  This patient was seen by Leretha Pol FNP Collaboration with Dr Lavera Guise as a part of collaborative care agreement  Orders Placed This Encounter  Procedures  . MR Lumbar Spine Wo Contrast  . Flu Vaccine MDCK QUAD PF  . Home sleep test      Total time spent: 30 Minutes  Time spent includes review of chart, medications, test results, and follow up plan with the patient.      Dr Lavera Guise Internal medicine

## 2019-04-29 NOTE — Progress Notes (Signed)
Normal study. Mildly abnormal ABI on right side. Discuss at visit 05/05/2019

## 2019-05-01 ENCOUNTER — Other Ambulatory Visit: Payer: Self-pay | Admitting: Nurse Practitioner

## 2019-05-01 ENCOUNTER — Telehealth: Payer: Self-pay

## 2019-05-01 DIAGNOSIS — M79605 Pain in left leg: Secondary | ICD-10-CM

## 2019-05-01 DIAGNOSIS — M79604 Pain in right leg: Secondary | ICD-10-CM

## 2019-05-01 MED ORDER — PREDNISONE 10 MG (48) PO TBPK: ORAL_TABLET | ORAL | 0 refills | Status: DC

## 2019-05-01 NOTE — Telephone Encounter (Signed)
LMOM FOR PATIENT TO CONFIRM AND SCREEN FOR 05-05-19 OV.

## 2019-05-01 NOTE — Telephone Encounter (Signed)
Sent in new prescription for prednisone 12 day dose pack. Take as directed. Sent to [facility] [facility]

## 2019-05-01 NOTE — Progress Notes (Signed)
Sent in new prescription for prednisone 12 day dose pack. Take as directed. Sent to [employer]

## 2019-05-04 ENCOUNTER — Telehealth: Payer: Self-pay

## 2019-05-04 NOTE — Telephone Encounter (Signed)
Confirmed appointment on 05/05/2019 and screened for covid. klh 

## 2019-05-05 ENCOUNTER — Other Ambulatory Visit: Payer: Self-pay

## 2019-05-05 ENCOUNTER — Telehealth: Payer: Self-pay

## 2019-05-05 ENCOUNTER — Encounter: Payer: Medicaid Other | Admitting: Nurse Practitioner

## 2019-05-05 ENCOUNTER — Ambulatory Visit: Payer: Medicaid Other | Admitting: Nurse Practitioner

## 2019-05-05 VITALS — BP 148/80 | HR 76 | Temp 97.3°F | Resp 16 | Ht 68.5 in | Wt 209.4 lb

## 2019-05-05 DIAGNOSIS — M79605 Pain in left leg: Secondary | ICD-10-CM

## 2019-05-05 DIAGNOSIS — M5417 Radiculopathy, lumbosacral region: Secondary | ICD-10-CM | POA: Diagnosis not present

## 2019-05-05 DIAGNOSIS — R5383 Other fatigue: Secondary | ICD-10-CM | POA: Diagnosis not present

## 2019-05-05 DIAGNOSIS — M79604 Pain in right leg: Secondary | ICD-10-CM

## 2019-05-05 DIAGNOSIS — R0602 Shortness of breath: Secondary | ICD-10-CM

## 2019-05-05 DIAGNOSIS — E1165 Type 2 diabetes mellitus with hyperglycemia: Secondary | ICD-10-CM

## 2019-05-05 LAB — POCT GLYCOSYLATED HEMOGLOBIN (HGB A1C): Hemoglobin A1C: 6 % — AB (ref 4.0–5.6)

## 2019-05-05 NOTE — Telephone Encounter (Signed)
Per patient she would like to wait on sleep study, this needs to be done in office and patient will need to have enough time for child care arrangements before scheduling the overnight sleep study. Beth

## 2019-05-05 NOTE — Progress Notes (Signed)
[facility], [place] [address]  Internal MEDICINE  Office Visit Note  Patient Name: Holly Espinoza  [id number]  [id number]  Date of Service: 05/10/2019  Chief Complaint  Patient presents with  . Follow-up    review ultrasound   . Diabetes  . Back Pain  . Hip Pain  . Leg Pain    The patient is here for follow up visit. Continues to have pain in lower back, hips, and both lower legs. This did get worse after fall at home. MRI has been ordered but not scheduled. She des see her neurosurgeon on 05/07/2019. I did add prednisone taper at her last visit. The first few days did help pain and swelling. Reduction in dose has caused increased pain. Arterial study of the lower extremities showed no arterial insufficiencies. Starting to have some increased swelling in both lower legs, worse on the left side than the right. The patient continues to have fatigue and shortness of breath. She has noted some ridges in her fingernails.       Current Medication: Outpatient Encounter Medications as of 05/05/2019  Medication Sig  . allopurinol (ZYLOPRIM) 100 MG tablet Take 2 tab po at bedtime  . aspirin 81 MG chewable tablet Chew 81 mg by mouth daily.  . clarithromycin (BIAXIN) 500 MG tablet Take 1 tablet (500 mg total) by mouth 2 (two) times daily.  . Colchicine (MITIGARE) 0.6 MG CAPS Take 1.2 mg by mouth See admin instructions. At onset of gout flare. May repeat one additional capsule 1 hour later if symptoms persist.  . Cyanocobalamin (B-12) 1000 MCG/ML KIT Inject 1,000 mcg as directed every 30 (thirty) days.  . diazepam (VALIUM) 5 MG tablet Take 1/2 to 1 tablet po BID PRN only.  . DULoxetine (CYMBALTA) 60 MG capsule Take 1 capsule (60 mg total) by mouth daily.  . fexofenadine (ALLEGRA) 180 MG tablet Take 180 mg by mouth daily.  . fluconazole (DIFLUCAN) 150 MG tablet Take 1 tablet (150 mg total) by mouth daily. Take one tablet once and may repeat if symptoms persist in  3 days  . furosemide (LASIX) 20 MG tablet Take 1 tablet (20 mg total) by mouth daily as needed for fluid or edema.  Marland Kitchen gemfibrozil (LOPID) 600 MG tablet Take 1 tablet (600 mg total) by mouth 2 (two) times daily before a meal.  . hydrochlorothiazide (HYDRODIURIL) 25 MG tablet Take 1 tablet (25 mg total) by mouth daily.  . Insulin Pen Needle (PEN NEEDLES 5/16") 30G X 8 MM MISC To use every day with victoza injections - E11.65  . levothyroxine (SYNTHROID) 75 MCG tablet Take 1 tablet (75 mcg total) by mouth daily before breakfast.  . liraglutide (VICTOZA) 18 MG/3ML SOPN Inject  1.8 mg [place]  daily  . meloxicam (MOBIC) 7.5 MG tablet Take 1 tablet (7.5 mg total) by mouth 2 (two) times daily as needed for pain.  . mirtazapine (REMERON) 15 MG tablet Take 1 tablet (15 mg total) by mouth at bedtime.  . mupirocin ointment (BACTROBAN) 2 % Place 1 application into the nose 2 (two) times daily.  Marland Kitchen nystatin ointment (MYCOSTATIN) Apply 1 application topically 3 (three) times daily.  Marland Kitchen omeprazole (PRILOSEC) 40 MG capsule Take 1 capsule (40 mg total) by mouth daily.  . ondansetron (ZOFRAN) 4 MG tablet Take 4 mg by mouth every 8 (eight) hours as needed for nausea. For nausea  . predniSONE (STERAPRED UNI-PAK 48 TAB) 10 MG (48) TBPK tablet 12 day taper -  take by mouth as directed for 12 days  . pregabalin (LYRICA) 50 MG capsule Take 50 mg by mouth 2 (two) times daily.  . Syringe/Needle, Disp, (SYRINGE 3CC/20GX1") 20G X 1" 3 ML MISC To sue for B12 injections once monthly  . topiramate (TOPAMAX) 100 MG tablet Take 100 mg by mouth 2 (two) times daily.  . [DISCONTINUED] ciprofloxacin-dexamethasone (CIPRODEX) OTIC suspension Place 4 drops into the right ear 2 (two) times daily. (Patient not taking: Reported on 05/05/2019)  . [DISCONTINUED] guaiFENesin-dextromethorphan (ROBITUSSIN DM) 100-10 MG/5ML syrup Take 5 mLs by mouth every 4 (four) hours as needed for cough. (Patient not taking: Reported on 05/05/2019)  . [DISCONTINUED]  sulfamethoxazole-trimethoprim (BACTRIM DS) 800-160 MG tablet Take 1 tablet by mouth 2 (two) times daily. (Patient not taking: Reported on 05/05/2019)   No facility-administered encounter medications on file as of 05/05/2019.    Surgical History: Past Surgical History:  Procedure Laterality Date  . ABDOMINAL HYSTERECTOMY  2001?  . ANTERIOR AND POSTERIOR REPAIR N/A 11/01/2015   Procedure: CYSTOSCOPY, ANTERIOR (CYSTOCELE) AND POSTERIOR REPAIR (RECTOCELE);  Surgeon: Bjorn Loser, MD;  Location: [facility];  Service: Urology;  Laterality: N/A;  . ANTERIOR CERVICAL DECOMP/DISCECTOMY FUSION  2012  . BACK SURGERY  2001   laminectomy  . BREAST BIOPSY Left 2014  . BREAST LUMPECTOMY Left 02/25/2013  . BREAST SURGERY Left 02/25/13   lumpectomy  . CHOLECYSTECTOMY  1980's  . DILATION AND CURETTAGE OF UTERUS  [phone]   "probably 3" (07/08/2012)  . FRACTURE SURGERY     C4-7 Surgery for fracture  . LUMBAR WOUND DEBRIDEMENT N/A 08/12/2012   Procedure: LUMBAR WOUND DEBRIDEMENT;  Surgeon: Faythe Ghee, MD;  Location: [facility];  Service: Neurosurgery;  Laterality: N/A;  Lumbar Wound Debridement  . POSTERIOR LUMBAR FUSION  07/08/2012  . TONSILLECTOMY  ~ 1968    Medical History: Past Medical History:  Diagnosis Date  . Anxiety   . Arthritis    "pretty much from my neck to my feet" (07/08/2012)  . B12 deficiency anemia   . Chronic lower back pain   . Depression   . Diabetic peripheral neuropathy ([place])   . Gout   . High cholesterol   . 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 Neuromuscular disorder ([place])   . Rheumatoid arthritis ([place])   . Type II diabetes mellitus (HCC)    recent hgb A1C was 5.4 per Dr. Humphrey Rolls, [place], [place]    Family History: Family History  Problem Relation Age of Onset  . Kidney disease Father   . Cancer Other        breast great grandmother  . Breast cancer Other     Social History   Socioeconomic History  . Marital status: Single     Spouse name: Not on file  . Number of children: Not on file  . Years of education: Not on file  . Highest education level: Not on file  Occupational History  . Not on file  Tobacco Use  . Smoking status: Current Every Day Smoker    Packs/day: 0.50    Years: 40.00    Pack years: 20.00    Types: Cigarettes  . Smokeless tobacco: Never Used  Substance and Sexual Activity  . Alcohol use: No  . Drug use: No    Types: Marijuana, Cocaine    Comment: 07/08/2012 "last drug use >8 yr ago" (07/08/2012)  . Sexual activity: Yes    Comment: quit a long time ago  Other Topics Concern  . Not on file  Social History Narrative  . Not on file   Social Determinants of Health   Financial Resource Strain:   . Difficulty of Paying Living Expenses: Not on file  Food Insecurity:   . Worried About Charity fundraiser in the Last Year: Not on file  . Ran Out of Food in the Last Year: Not on file  Transportation Needs:   . Lack of Transportation (Medical): Not on file  . Lack of Transportation (Non-Medical): Not on file  Physical Activity:   . Days of Exercise per Week: Not on file  . Minutes of Exercise per Session: Not on file  Stress:   . Feeling of Stress : Not on file  Social Connections:   . Frequency of Communication with Friends and Family: Not on file  . Frequency of Social Gatherings with Friends and Family: Not on file  . Attends Religious Services: Not on file  . Active Member of Clubs or Organizations: Not on file  . Attends Archivist Meetings: Not on file  . Marital Status: Not on file  Intimate Partner Violence:   . Fear of Current or Ex-Partner: Not on file  . Emotionally Abused: Not on file  . Physically Abused: Not on file  . Sexually Abused: Not on file      Review of Systems  Constitutional: Positive for activity change and fatigue. Negative for chills and unexpected weight change.       Improved swelling in both lower legs.   HENT: Negative for congestion,  postnasal drip, rhinorrhea, sneezing and sore throat.   Respiratory: Positive for shortness of breath. Negative for cough, chest tightness and wheezing.   Cardiovascular: Positive for leg swelling. Negative for chest pain and palpitations.       Improved bilateral leg swelling.  Gastrointestinal: Negative for abdominal pain, constipation, diarrhea, nausea and vomiting.  Endocrine: Negative for cold intolerance, heat intolerance, polydipsia and polyuria.       Blood sugars doing well   Musculoskeletal: Positive for arthralgias, back pain, gait problem, joint swelling and myalgias. Negative for neck pain.       Severe right leg pain after fall in the home.   Skin: Negative for rash.  Allergic/Immunologic: Negative for environmental allergies.  Neurological: Positive for weakness. Negative for tremors and numbness.  Hematological: Negative for adenopathy. Does not bruise/bleed easily.  Psychiatric/Behavioral: Negative for behavioral problems (Depression), sleep disturbance and suicidal ideas. The patient is nervous/anxious.     Today's Vitals   05/05/19 1148  BP: (!) 148/80  Pulse: 76  Resp: 16  Temp: (!) 97.3 F (36.3 C)  SpO2: 97%  Weight: 209 lb 6.4 oz (95 kg)  Height: 5' 8.5" (1.74 m)   Body mass index is 31.38 kg/m.  Physical Exam Vitals and nursing note reviewed.  Constitutional:      General: She is not in acute distress.    Appearance: Normal appearance. She is well-developed. She is not diaphoretic.  HENT:     Head: Normocephalic and atraumatic.     Nose: Nose normal.     Mouth/Throat:     Pharynx: No oropharyngeal exudate.  Eyes:     Pupils: Pupils are equal, round, and reactive to light.  Neck:     Thyroid: No thyromegaly.     Vascular: No carotid bruit or JVD.     Trachea: No tracheal deviation.  Cardiovascular:     Rate and Rhythm: Normal rate and  regular rhythm.     Heart sounds: Normal heart sounds. No murmur. No friction rub. No gallop.      Comments:  There is 1+ pitting edema in bilateral lowe extremities. Swelling has improved however, pain persists in both lower legs and feet. Capillary refill is <3seconds. Pulmonary:     Effort: Pulmonary effort is normal. No respiratory distress.     Breath sounds: Normal breath sounds. No wheezing or rales.  Chest:     Chest wall: No tenderness.  Abdominal:     Palpations: Abdomen is soft.  Musculoskeletal:        General: Normal range of motion.     Cervical back: Normal range of motion and neck supple. Torticollis present. Muscular tenderness present.     Comments: Moderate to severe low back pain which radiates into the hips and lower legs. More severe when bending and twisting at the waist. She grimaces when changing position, even while seated. No palpable or visible abnormalities are noted at this time.   Lymphadenopathy:     Cervical: No cervical adenopathy.  Skin:    General: Skin is warm and dry.     Findings: Erythema present.     Comments: Mild, but improved erythema present in both lower extremitates.   Neurological:     Mental Status: She is alert and oriented to person, place, and time. Mental status is at baseline.     Cranial Nerves: No cranial nerve deficit.  Psychiatric:        Attention and Perception: Attention and perception normal.        Mood and Affect: Affect normal. Mood is anxious and depressed.        Speech: Speech normal.        Behavior: Behavior normal. Behavior is cooperative.        Thought Content: Thought content normal.        Cognition and Memory: Cognition and memory normal.        Judgment: Judgment normal.    Assessment/Plan: 1. Type 2 diabetes mellitus with hyperglycemia, without long-term current use of insulin (HCC) - POCT HgB A1C 6.0 today. Continue diabetic medication as prescribed   2. Other fatigue Lab work ordered for further evaluation.   3. Shortness of breath Will get chest x-ray for further evaluation.  - DG Chest 2 View;  Future  4. Lumbosacral radiculopathy due to intervertebral disc disorder Patient advised to keep upcoming appointment with neurosurgery.   5. Pain in both lower extremities Mildly improved. Concern this is coming from lumbosacral disc disease. She should discuss with neurosurgeon at her next visit .  General Counseling: setsuko robins understanding of the findings of todays visit and agrees with plan of treatment. I have discussed any further diagnostic evaluation that may be needed or ordered today. We also reviewed her medications today. she has been encouraged to call the office with any questions or concerns that should arise related to todays visit.    Orders Placed This Encounter  Procedures  . DG Chest 2 View  . POCT HgB A1C   Diabetes Counseling:  1. Addition of ACE inh/ ARB'S for nephroprotection. Microalbumin is updated  2. Diabetic foot care, prevention of complications. Podiatry consult 3. Exercise and lose weight.  4. Diabetic eye examination, Diabetic eye exam is updated  5. Monitor blood sugar closlely. nutrition counseling.  6. Sign and symptoms of hypoglycemia including shaking sweating,confusion and headaches.  This patient was seen by Leretha Pol FNP Collaboration with Dr  Lavera Guise as a part of collaborative care agreement   Total time spent: 30 Minutes  Time spent includes review of chart, medications, test results, and follow up plan with the patient.     Dr Lavera Guise Internal medicine

## 2019-05-07 ENCOUNTER — Ambulatory Visit
Admission: RE | Admit: 2019-05-07 | Discharge: 2019-05-07 | Disposition: A | Discharge status: Home / Self Care | Payer: Medicaid Other | Source: Ambulatory Visit | Attending: Nurse Practitioner | Admitting: Nurse Practitioner

## 2019-05-07 ENCOUNTER — Other Ambulatory Visit: Payer: Self-pay

## 2019-05-07 DIAGNOSIS — R0602 Shortness of breath: Secondary | ICD-10-CM

## 2019-05-08 ENCOUNTER — Telehealth: Payer: Self-pay

## 2019-05-08 NOTE — Telephone Encounter (Signed)
Pt advised chest xray showed no acute  abnormalities

## 2019-05-08 NOTE — Progress Notes (Signed)
Hey. Can you let the patient know that her chest x-ray shows no acute abnormalities. Thanks.

## 2019-05-08 NOTE — Telephone Encounter (Signed)
-----   Message from Ronnell Freshwater, NP sent at 05/08/2019  8:32 AM EST ----- Hey. Can you let the patient know that her chest x-ray shows no acute abnormalities. Thanks.

## 2019-05-10 ENCOUNTER — Encounter: Payer: Self-pay | Admitting: Nurse Practitioner

## 2019-05-10 DIAGNOSIS — R5383 Other fatigue: Secondary | ICD-10-CM | POA: Insufficient documentation

## 2019-05-12 ENCOUNTER — Telehealth: Payer: Self-pay

## 2019-05-12 NOTE — Telephone Encounter (Signed)
Left a message regarding mri decision, asked pt to call back, [facility]

## 2019-05-13 ENCOUNTER — Telehealth: Payer: Self-pay

## 2019-05-13 NOTE — Telephone Encounter (Signed)
Pt called stating that she is out of her victoza and the pharmacy is unable to fill this rx until the 13th or 14th. Pt does not know why she is out but she does not have anymore medication left.  Spoke with Nira Conn and informed pt that she is able to pick a sample of ozempic to replace the victoza until she is able to fill her victoza and to take the ozempic 0.5 mg weekly. Repeated the dosing and will provide directions on the sample that will be given to her.

## 2019-05-20 ENCOUNTER — Other Ambulatory Visit: Payer: Self-pay

## 2019-05-20 DIAGNOSIS — K219 Gastro-esophageal reflux disease without esophagitis: Secondary | ICD-10-CM

## 2019-05-20 MED ORDER — OMEPRAZOLE 40 MG PO CPDR: 40.0000 mg | DELAYED_RELEASE_CAPSULE | Freq: Every day | ORAL | 3 refills | Status: DC

## 2019-05-20 MED ORDER — ONDANSETRON HCL 4 MG PO TABS: 4.0000 mg | ORAL_TABLET | Freq: Three times a day (TID) | ORAL | 0 refills | Status: DC | PRN

## 2019-05-27 ENCOUNTER — Other Ambulatory Visit: Payer: Self-pay

## 2019-05-27 ENCOUNTER — Other Ambulatory Visit: Payer: Self-pay | Admitting: Nurse Practitioner

## 2019-05-27 DIAGNOSIS — F410 Panic disorder [episodic paroxysmal anxiety] without agoraphobia: Secondary | ICD-10-CM

## 2019-05-27 DIAGNOSIS — M542 Cervicalgia: Secondary | ICD-10-CM

## 2019-05-27 DIAGNOSIS — F411 Generalized anxiety disorder: Secondary | ICD-10-CM

## 2019-05-27 MED ORDER — DIAZEPAM 5 MG PO TABS: ORAL_TABLET | ORAL | 0 refills | Status: DC

## 2019-06-03 ENCOUNTER — Telehealth: Payer: Self-pay

## 2019-06-03 ENCOUNTER — Other Ambulatory Visit: Payer: Self-pay | Admitting: Nurse Practitioner

## 2019-06-03 ENCOUNTER — Other Ambulatory Visit: Payer: Self-pay

## 2019-06-03 DIAGNOSIS — E538 Deficiency of other specified B group vitamins: Secondary | ICD-10-CM

## 2019-06-03 DIAGNOSIS — E1165 Type 2 diabetes mellitus with hyperglycemia: Secondary | ICD-10-CM

## 2019-06-03 MED ORDER — LIRAGLUTIDE 18 MG/3ML ~~LOC~~ SOPN: PEN_INJECTOR | SUBCUTANEOUS | 3 refills | Status: DC

## 2019-06-03 MED ORDER — "SYRINGE 20G X 1"" 3 ML MISC": 11 refills | Status: DC

## 2019-06-03 NOTE — Telephone Encounter (Signed)
Confirmed and screened patient for appt 06/05/19

## 2019-06-03 NOTE — Telephone Encounter (Signed)
LMOM TO CONFIRM AND SCREEN FOR 06-05-19 OV. 

## 2019-06-04 ENCOUNTER — Telehealth: Payer: Self-pay

## 2019-06-04 ENCOUNTER — Other Ambulatory Visit: Payer: Self-pay

## 2019-06-04 LAB — COMPREHENSIVE METABOLIC PANEL
ALT: 14 IU/L (ref 0–32)
AST: 25 IU/L (ref 0–40)
Albumin/Globulin Ratio: 1.6 (ref 1.2–2.2)
Albumin: 4.2 g/dL (ref 3.8–4.9)
Alkaline Phosphatase: 166 IU/L — ABNORMAL HIGH (ref 39–117)
BUN/Creatinine Ratio: 13 (ref 9–23)
BUN: 16 mg/dL (ref 6–24)
Bilirubin Total: 0.3 mg/dL (ref 0.0–1.2)
CO2: 22 mmol/L (ref 20–29)
Calcium: 9.5 mg/dL (ref 8.7–10.2)
Chloride: 106 mmol/L (ref 96–106)
Creatinine, Ser: 1.19 mg/dL — ABNORMAL HIGH (ref 0.57–1.00)
GFR calc Af Amer: 58 mL/min/{1.73_m2} — ABNORMAL LOW (ref >59)
GFR calc non Af Amer: 50 mL/min/{1.73_m2} — ABNORMAL LOW (ref >59)
Globulin, Total: 2.6 g/dL (ref 1.5–4.5)
Glucose: 114 mg/dL — ABNORMAL HIGH (ref 65–99)
Potassium: 3.9 mmol/L (ref 3.5–5.2)
Sodium: 141 mmol/L (ref 134–144)
Total Protein: 6.8 g/dL (ref 6.0–8.5)

## 2019-06-04 LAB — CBC
Hematocrit: 38.6 % (ref 34.0–46.6)
Hemoglobin: 12.7 g/dL (ref 11.1–15.9)
MCH: 26.9 pg (ref 26.6–33.0)
MCHC: 32.9 g/dL (ref 31.5–35.7)
MCV: 82 fL (ref 79–97)
Platelets: 294 10*3/uL (ref 150–450)
RBC: 4.72 x10E6/uL (ref 3.77–5.28)
RDW: 14.1 % (ref 11.7–15.4)
WBC: 5.5 10*3/uL (ref 3.4–10.8)

## 2019-06-04 LAB — VITAMIN D 25 HYDROXY (VIT D DEFICIENCY, FRACTURES): Vit D, 25-Hydroxy: 7.5 ng/mL — ABNORMAL LOW (ref 30.0–100.0)

## 2019-06-04 LAB — B12 AND FOLATE PANEL
Folate: 6 ng/mL (ref >3.0)
Vitamin B-12: 639 pg/mL (ref 232–1245)

## 2019-06-04 LAB — IRON AND TIBC
Iron Saturation: 9 % — CL (ref 15–55)
Iron: 40 ug/dL (ref 27–159)
Total Iron Binding Capacity: 447 ug/dL (ref 250–450)
UIBC: 407 ug/dL (ref 131–425)

## 2019-06-04 LAB — FERRITIN: Ferritin: 14 ng/mL — ABNORMAL LOW (ref 15–150)

## 2019-06-04 NOTE — Telephone Encounter (Signed)
PRIOR AUTHORIZATION FOR VICTOZA HAS BEEN APPROVED.Marland KitchenVALID FROM 06/04/19-05/29/20/TAT

## 2019-06-05 ENCOUNTER — Other Ambulatory Visit: Payer: Self-pay

## 2019-06-05 ENCOUNTER — Encounter: Payer: Self-pay | Admitting: Nurse Practitioner

## 2019-06-05 ENCOUNTER — Ambulatory Visit: Payer: Medicaid Other | Admitting: Nurse Practitioner

## 2019-06-05 VITALS — BP 130/63 | HR 76 | Temp 97.5°F | Resp 16 | Ht 69.0 in | Wt 207.0 lb

## 2019-06-05 DIAGNOSIS — E1165 Type 2 diabetes mellitus with hyperglycemia: Secondary | ICD-10-CM | POA: Diagnosis not present

## 2019-06-05 DIAGNOSIS — Z0001 Encounter for general adult medical examination with abnormal findings: Secondary | ICD-10-CM | POA: Diagnosis not present

## 2019-06-05 DIAGNOSIS — E559 Vitamin D deficiency, unspecified: Secondary | ICD-10-CM

## 2019-06-05 DIAGNOSIS — R300 Dysuria: Secondary | ICD-10-CM

## 2019-06-05 DIAGNOSIS — M542 Cervicalgia: Secondary | ICD-10-CM

## 2019-06-05 DIAGNOSIS — F410 Panic disorder [episodic paroxysmal anxiety] without agoraphobia: Secondary | ICD-10-CM

## 2019-06-05 DIAGNOSIS — F411 Generalized anxiety disorder: Secondary | ICD-10-CM

## 2019-06-05 DIAGNOSIS — Z1231 Encounter for screening mammogram for malignant neoplasm of breast: Secondary | ICD-10-CM

## 2019-06-05 DIAGNOSIS — D509 Iron deficiency anemia, unspecified: Secondary | ICD-10-CM | POA: Diagnosis not present

## 2019-06-05 DIAGNOSIS — N644 Mastodynia: Secondary | ICD-10-CM

## 2019-06-05 MED ORDER — DULOXETINE HCL 60 MG PO CPEP: 60.0000 mg | ORAL_CAPSULE | Freq: Two times a day (BID) | ORAL | 3 refills | Status: DC

## 2019-06-05 MED ORDER — FERROUS SULFATE 325 (65 FE) MG PO TABS: 325.0000 mg | ORAL_TABLET | Freq: Every day | ORAL | 3 refills | Status: DC

## 2019-06-05 MED ORDER — ERGOCALCIFEROL 1.25 MG (50000 UT) PO CAPS: 50000.0000 [IU] | ORAL_CAPSULE | ORAL | 5 refills | Status: DC

## 2019-06-05 MED ORDER — DIAZEPAM 5 MG PO TABS: ORAL_TABLET | ORAL | 2 refills | Status: DC

## 2019-06-05 NOTE — Progress Notes (Signed)
[facility] [place], [place] [address]  Internal MEDICINE  Office Visit Note  Patient Name: Holly Espinoza  [id number]  [id number]  Date of Service: 06/20/2019   Pt is here for routine health maintenance examination  Chief Complaint  Patient presents with  . Annual Exam    not felling good after a fall another fall since last visit  . Anxiety  . Hypertension  . Diabetes  . Hyperlipidemia  . Breast Pain    under right breast      The patient is here for health maintenance exam. Continues to have pain in lower back, hips, and both lower legs. Thus far, imaging studies have been negative. She has had labs done since her most recent visit. She has moderate iron deficiency anemia. She also has vitamin d deficiency. Her blood sugars and blood pressures continue to be well controlled. She does complain of significant fatigue.     Current Medication: Outpatient Encounter Medications as of 06/05/2019  Medication Sig  . allopurinol (ZYLOPRIM) 100 MG tablet Take 2 tab po at bedtime  . aspirin 81 MG chewable tablet Chew 81 mg by mouth daily.  . Colchicine (MITIGARE) 0.6 MG CAPS Take 1.2 mg by mouth See admin instructions. At onset of gout flare. May repeat one additional capsule 1 hour later if symptoms persist.  . Cyanocobalamin (B-12) 1000 MCG/ML KIT Inject 1,000 mcg as directed every 30 (thirty) days.  . diazepam (VALIUM) 5 MG tablet Take 1/2 to 1 tablet po BID PRN only.  . DULoxetine (CYMBALTA) 60 MG capsule Take 1 capsule (60 mg total) by mouth 2 (two) times daily.  . fexofenadine (ALLEGRA) 180 MG tablet Take 180 mg by mouth daily.  . fluconazole (DIFLUCAN) 150 MG tablet Take 1 tablet (150 mg total) by mouth daily. Take one tablet once and may repeat if symptoms persist in 3 days (Patient not taking: Reported on 06/09/2019)  . furosemide (LASIX) 20 MG tablet Take 1 tablet (20 mg total) by mouth daily as needed for fluid or edema. (Patient not taking: Reported  on 06/09/2019)  . gemfibrozil (LOPID) 600 MG tablet Take 1 tablet (600 mg total) by mouth 2 (two) times daily before a meal.  . hydrochlorothiazide (HYDRODIURIL) 25 MG tablet Take 1 tablet (25 mg total) by mouth daily.  . Insulin Pen Needle (PEN NEEDLES 5/16") 30G X 8 MM MISC To use every day with victoza injections - E11.65  . levothyroxine (SYNTHROID) 75 MCG tablet Take 1 tablet (75 mcg total) by mouth daily before breakfast.  . liraglutide (VICTOZA) 18 MG/3ML SOPN Inject  1.8 mg [place]  daily  . meloxicam (MOBIC) 7.5 MG tablet Take 1 tablet (7.5 mg total) by mouth 2 (two) times daily as needed for pain.  . mirtazapine (REMERON) 15 MG tablet Take 1 tablet (15 mg total) by mouth at bedtime. (Patient not taking: Reported on 06/09/2019)  . mupirocin ointment (BACTROBAN) 2 % Place 1 application into the nose 2 (two) times daily. (Patient not taking: Reported on 06/09/2019)  . nystatin ointment (MYCOSTATIN) Apply 1 application topically 3 (three) times daily. (Patient not taking: Reported on 06/09/2019)  . omeprazole (PRILOSEC) 40 MG capsule Take 1 capsule (40 mg total) by mouth daily.  . ondansetron (ZOFRAN) 4 MG tablet Take 1 tablet (4 mg total) by mouth every 8 (eight) hours as needed for nausea. For nausea  . predniSONE (STERAPRED UNI-PAK 48 TAB) 10 MG (48) TBPK tablet 12 day taper - take by mouth  as directed for 12 days (Patient not taking: Reported on 06/09/2019)  . pregabalin (LYRICA) 50 MG capsule Take 50 mg by mouth 2 (two) times daily.  . Syringe/Needle, Disp, (SYRINGE 3CC/20GX1") 20G X 1" 3 ML MISC To sue for B12 injections once monthly  . topiramate (TOPAMAX) 100 MG tablet Take 100 mg by mouth 2 (two) times daily.  . [DISCONTINUED] clarithromycin (BIAXIN) 500 MG tablet Take 1 tablet (500 mg total) by mouth 2 (two) times daily.  . [DISCONTINUED] diazepam (VALIUM) 5 MG tablet Take 1/2 to 1 tablet po BID PRN only.  . [DISCONTINUED] DULoxetine (CYMBALTA) 60 MG capsule Take 1 capsule (60 mg total) by  mouth daily.  . ergocalciferol (DRISDOL) 1.25 MG (50000 UT) capsule Take 1 capsule (50,000 Units total) by mouth once a week.  . [DISCONTINUED] ferrous sulfate (FERROUSUL) 325 (65 FE) MG tablet Take 1 tablet (325 mg total) by mouth daily with breakfast.   No facility-administered encounter medications on file as of 06/05/2019.    Surgical History: Past Surgical History:  Procedure Laterality Date  . ABDOMINAL HYSTERECTOMY  2001?  . ANTERIOR AND POSTERIOR REPAIR N/A 11/01/2015   Procedure: CYSTOSCOPY, ANTERIOR (CYSTOCELE) AND POSTERIOR REPAIR (RECTOCELE);  Surgeon: Bjorn Loser, MD;  Location: [facility];  Service: Urology;  Laterality: N/A;  . ANTERIOR CERVICAL DECOMP/DISCECTOMY FUSION  2012  . BACK SURGERY  2001   laminectomy  . BREAST BIOPSY Left 2014  . BREAST LUMPECTOMY Left 02/25/2013  . BREAST SURGERY Left 02/25/13   lumpectomy  . CHOLECYSTECTOMY  1980's  . DILATION AND CURETTAGE OF UTERUS  [phone]   "probably 3" (07/08/2012)  . FRACTURE SURGERY     C4-7 Surgery for fracture  . LUMBAR WOUND DEBRIDEMENT N/A 08/12/2012   Procedure: LUMBAR WOUND DEBRIDEMENT;  Surgeon: Faythe Ghee, MD;  Location: [place] [facility];  Service: Neurosurgery;  Laterality: N/A;  Lumbar Wound Debridement  . POSTERIOR LUMBAR FUSION  07/08/2012  . TONSILLECTOMY  ~ 1968    Medical History: Past Medical History:  Diagnosis Date  . Anxiety   . Arthritis    "pretty much from my neck to my feet" (07/08/2012)  . B12 deficiency anemia   . Chronic lower back pain   . Depression   . Diabetic peripheral neuropathy ([place])   . Gout   . High cholesterol   . IDA (iron deficiency anemia) 06/17/2019  . 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 Neuromuscular disorder ([place])   . Rheumatoid arthritis ([place])   . Type II diabetes mellitus (HCC)    recent hgb A1C was 5.4 per Dr. Humphrey Rolls, [place], [place]    Family History: Family History  Problem Relation Age of Onset  . Kidney disease Father   .  Heart disease Father   . Diabetes Father   . Hypertension Mother   . Depression Mother   . Cancer Other        breast great grandmother  . Breast cancer Other       Review of Systems  Constitutional: Positive for activity change and fatigue. Negative for chills and unexpected weight change.       Improved swelling in both lower legs.   HENT: Negative for congestion, postnasal drip, rhinorrhea, sneezing and sore throat.   Respiratory: Positive for shortness of breath. Negative for cough, chest tightness and wheezing.   Cardiovascular: Positive for leg swelling. Negative for chest pain and palpitations.       Improved bilateral leg swelling.  Gastrointestinal: Negative  for abdominal pain, constipation, diarrhea, nausea and vomiting.  Endocrine: Negative for cold intolerance, heat intolerance, polydipsia and polyuria.       Blood sugars doing well   Musculoskeletal: Positive for arthralgias, back pain, gait problem, joint swelling and myalgias. Negative for neck pain.       Severe right leg pain after fall in the home.   Skin: Negative for rash.  Allergic/Immunologic: Negative for environmental allergies.  Neurological: Positive for weakness. Negative for tremors and numbness.  Hematological: Negative for adenopathy. Does not bruise/bleed easily.  Psychiatric/Behavioral: Negative for behavioral problems (Depression), sleep disturbance and suicidal ideas. The patient is nervous/anxious.      Today's Vitals   06/05/19 1417  BP: 130/63  Pulse: 76  Resp: 16  Temp: (!) 97.5 F (36.4 C)  SpO2: 98%  Weight: 207 lb (93.9 kg)  Height: 5' 9"  (1.753 m)   Body mass index is 30.57 kg/m.  Physical Exam Vitals and nursing note reviewed.  Constitutional:      General: She is not in acute distress.    Appearance: Normal appearance. She is well-developed. She is ill-appearing. She is not diaphoretic.  HENT:     Head: Normocephalic and atraumatic.     Nose: Nose normal.      Mouth/Throat:     Mouth: Mucous membranes are moist.     Pharynx: No oropharyngeal exudate.  Eyes:     Pupils: Pupils are equal, round, and reactive to light.  Neck:     Thyroid: No thyromegaly.     Vascular: No carotid bruit or JVD.     Trachea: No tracheal deviation.  Cardiovascular:     Rate and Rhythm: Normal rate and regular rhythm.     Pulses: Normal pulses.          Dorsalis pedis pulses are 2+ on the right side and 2+ on the left side.       Posterior tibial pulses are 2+ on the right side and 2+ on the left side.     Heart sounds: Normal heart sounds. No murmur. No friction rub. No gallop.      Comments: There is 1+ pitting edema in bilateral lowe extremities. Swelling has improved however, pain persists in both lower legs and feet. Capillary refill is <3seconds. Pulmonary:     Effort: Pulmonary effort is normal. No respiratory distress.     Breath sounds: Normal breath sounds. No wheezing or rales.  Chest:     Chest wall: No tenderness.     Breasts:        Right: Tenderness present. No swelling, bleeding, inverted nipple, mass, nipple discharge or skin change.        Left: Normal. No swelling, bleeding, inverted nipple, mass, nipple discharge, skin change or tenderness.     Comments: There is generalized tenderness of the right breast with light and medium palpation. No masses, lumps, redness, or other evidence of infection or inflammation noted.  Abdominal:     General: Bowel sounds are normal.     Palpations: Abdomen is soft.  Musculoskeletal:        General: Normal range of motion.     Cervical back: Normal range of motion and neck supple. Torticollis present. Muscular tenderness present.     Right foot: Normal range of motion. No deformity or bunion.     Left foot: Normal range of motion. No deformity or bunion.     Comments: Moderate to severe low back pain which radiates into the hips  and lower legs. More severe when bending and twisting at the waist. She grimaces  when changing position, even while seated. No palpable or visible abnormalities are noted at this time.   Feet:     Right foot:     Protective Sensation: 10 sites tested. 5 sites sensed.     Skin integrity: Skin integrity normal.     Toenail Condition: Right toenails are normal.     Left foot:     Protective Sensation: 10 sites tested. 5 sites sensed.     Skin integrity: Skin integrity normal.     Toenail Condition: Left toenails are normal.  Lymphadenopathy:     Cervical: No cervical adenopathy.  Skin:    General: Skin is warm and dry.     Findings: Erythema present.     Comments: Mild, but improved erythema present in both lower extremitates.   Neurological:     Mental Status: She is alert and oriented to person, place, and time. Mental status is at baseline.     Cranial Nerves: No cranial nerve deficit.  Psychiatric:        Attention and Perception: Attention and perception normal.        Mood and Affect: Affect normal. Mood is anxious and depressed.        Speech: Speech normal.        Behavior: Behavior normal. Behavior is cooperative.        Thought Content: Thought content normal.        Cognition and Memory: Cognition and memory normal.        Judgment: Judgment normal.    LABS: Recent Results (from the past 2160 hour(s))  POCT HgB A1C     Status: Abnormal   Collection Time: 05/05/19 12:03 PM  Result Value Ref Range   Hemoglobin A1C 6.0 (A) 4.0 - 5.6 %   HbA1c POC (<> result, manual entry)     HbA1c, POC (prediabetic range)     HbA1c, POC (controlled diabetic range)    Comprehensive metabolic panel     Status: Abnormal   Collection Time: 06/03/19 11:47 AM  Result Value Ref Range   Glucose 114 (H) 65 - 99 mg/dL   BUN 16 6 - 24 mg/dL   Creatinine, Ser 1.19 (H) 0.57 - 1.00 mg/dL   GFR calc non Af Amer 50 (L) >59 mL/min/1.73   GFR calc Af Amer 58 (L) >59 mL/min/1.73   BUN/Creatinine Ratio 13 9 - 23   Sodium 141 134 - 144 mmol/L   Potassium 3.9 3.5 - 5.2 mmol/L    Chloride 106 96 - 106 mmol/L   CO2 22 20 - 29 mmol/L   Calcium 9.5 8.7 - 10.2 mg/dL   Total Protein 6.8 6.0 - 8.5 g/dL   Albumin 4.2 3.8 - 4.9 g/dL   Globulin, Total 2.6 1.5 - 4.5 g/dL   Albumin/Globulin Ratio 1.6 1.2 - 2.2   Bilirubin Total 0.3 0.0 - 1.2 mg/dL   Alkaline Phosphatase 166 (H) 39 - 117 IU/L   AST 25 0 - 40 IU/L   ALT 14 0 - 32 IU/L  CBC     Status: None   Collection Time: 06/03/19 11:47 AM  Result Value Ref Range   WBC 5.5 3.4 - 10.8 x10E3/uL   RBC 4.72 3.77 - 5.28 x10E6/uL   Hemoglobin 12.7 11.1 - 15.9 g/dL   Hematocrit 38.6 34.0 - 46.6 %   MCV 82 79 - 97 fL   MCH 26.9 26.6 -  33.0 pg   MCHC 32.9 31.5 - 35.7 g/dL   RDW 14.1 11.7 - 15.4 %   Platelets 294 150 - 450 x10E3/uL  Iron and TIBC     Status: Abnormal   Collection Time: 06/03/19 11:47 AM  Result Value Ref Range   Total Iron Binding Capacity 447 250 - 450 ug/dL   UIBC 407 131 - 425 ug/dL   Iron 40 27 - 159 ug/dL   Iron Saturation 9 (LL) 15 - 55 %  B12 and Folate Panel     Status: None   Collection Time: 06/03/19 11:47 AM  Result Value Ref Range   Vitamin B-12 639 232 - 1,245 pg/mL   Folate 6.0 >3.0 ng/mL    Comment: A serum folate concentration of less than 3.1 ng/mL is considered to represent clinical deficiency.   VITAMIN D 25 Hydroxy (Vit-D Deficiency, Fractures)     Status: Abnormal   Collection Time: 06/03/19 11:47 AM  Result Value Ref Range   Vit D, 25-Hydroxy 7.5 (L) 30.0 - 100.0 ng/mL    Comment: Vitamin D deficiency has been defined by the [place] practice guideline as a level of serum 25-OH vitamin D less than 20 ng/mL (1,2). The Endocrine Society went on to further define vitamin D insufficiency as a level between 21 and 29 ng/mL (2). 1. IOM (Institute of Medicine). 2010. Dietary reference    intakes for calcium and D. [place]: The    [employer]. 2. Holick MF, Binkley , Bischoff-Ferrari HA, et al.    Evaluation, treatment,  and prevention of vitamin D    deficiency: an Endocrine Society clinical practice    guideline. JCEM. 2011 Jul; 96(7):1911-30.   Ferritin     Status: Abnormal   Collection Time: 06/03/19 11:47 AM  Result Value Ref Range   Ferritin 14 (L) 15 - 150 ng/mL  Urinalysis, Routine w reflex microscopic     Status: None   Collection Time: 06/05/19 12:00 AM  Result Value Ref Range   Specific Gravity, UA 1.020 1.005 - 1.030   pH, UA 6.5 5.0 - 7.5   Color, UA Yellow Yellow   Appearance Ur Clear Clear   Leukocytes,UA Negative Negative   Protein,UA Negative Negative/Trace   Glucose, UA Negative Negative   Ketones, UA Negative Negative   RBC, UA Negative Negative   Bilirubin, UA Negative Negative   Urobilinogen, Ur 1.0 0.2 - 1.0 mg/dL   Nitrite, UA Negative Negative   Microscopic Examination Comment     Comment: Microscopic not indicated and not performed.  Microalbumin, urine     Status: None   Collection Time: 06/05/19 12:00 AM  Result Value Ref Range   Microalbumin, Urine 24.6 Not Estab. ug/mL    .Assessment/Plan: 1. Encounter for general adult medical examination with abnormal findings Annual health maintenance exam today.   2. Uncontrolled type 2 diabetes mellitus with hyperglycemia (HCC) Stable. Continue bp medication as prescribed  - Urinalysis, Routine w reflex microscopic  3. Iron deficiency anemia, unspecified iron deficiency anemia type Iron level very low. Refer to hematology for further evaluation and treatment.  - Ambulatory referral to Hematology  4. Vitamin D deficiency Start drisdol weekly for next six months.  - ergocalciferol (DRISDOL) 1.25 MG (50000 UT) capsule; Take 1 capsule (50,000 Units total) by mouth once a week.  Dispense: 4 capsule; Refill: 5  5. Encounter for screening mammogram for malignant neoplasm of breast - MM DIGITAL SCREENING BILATERAL; Future  6. Breast pain, right Diagnostic imaging of right breast ordered with screening mammogram.  - MM  Digital Diagnostic Unilat R; Future  7. Generalized anxiety disorder with panic attacks Increased duloxetine to 26m twice daily. May take valium 540mup to twice daily as needed for acute anxiety.  - DULoxetine (CYMBALTA) 60 MG capsule; Take 1 capsule (60 mg total) by mouth 2 (two) times daily.  Dispense: 60 capsule; Refill: 3 - diazepam (VALIUM) 5 MG tablet; Take 1/2 to 1 tablet po BID PRN only.  Dispense: 45 tablet; Refill: 2  8. Cervicalgia Valium 55m51may be used to help relieve sore and tight muscles.  - diazepam (VALIUM) 5 MG tablet; Take 1/2 to 1 tablet po BID PRN only.  Dispense: 45 tablet; Refill: 2  9. Dysuria - Microalbumin, urine  General Counseling: CarAmabelrbalizes understanding of the findings of todays visit and agrees with plan of treatment. I have discussed any further diagnostic evaluation that may be needed or ordered today. We also reviewed her medications today. she has been encouraged to call the office with any questions or concerns that should arise related to todays visit.    Counseling:  This patient was seen by HeaLeretha PolP Collaboration with Dr FozLavera Guise a part of collaborative care agreement  Orders Placed This Encounter  Procedures  . MM DIGITAL SCREENING BILATERAL  . MM Digital Diagnostic Unilat R  . Urinalysis, Routine w reflex microscopic  . Microalbumin, urine  . Ambulatory referral to Hematology    Meds ordered this encounter  Medications  . ergocalciferol (DRISDOL) 1.25 MG (50000 UT) capsule    Sig: Take 1 capsule (50,000 Units total) by mouth once a week.    Dispense:  4 capsule    Refill:  5    Order Specific Question:   Supervising Provider    Answer:   KHALavera Guise4[[id number]] DISCONTD: ferrous sulfate (FERROUSUL) 325 (65 FE) MG tablet    Sig: Take 1 tablet (325 mg total) by mouth daily with breakfast.    Dispense:  30 tablet    Refill:  3    Order Specific Question:   Supervising Provider    Answer:   KHALavera [email or web address]. DULoxetine (CYMBALTA) 60 MG capsule    Sig: Take 1 capsule (60 mg total) by mouth 2 (two) times daily.    Dispense:  60 capsule    Refill:  3    Please note increased dosing frequency    Order Specific Question:   Supervising Provider    Answer:   KHALavera Guise4[[id number]] diazepam (VALIUM) 5 MG tablet    Sig: Take 1/2 to 1 tablet po BID PRN only.    Dispense:  45 tablet    Refill:  2    Take 1 tab po bid prn for muscle pain and tightness.    Order Specific Question:   Supervising Provider    Answer:   KHALavera Guise4[[id number]] Total time spent: 45 89nutes  Time spent includes review of chart, medications, test results, and follow up plan with the patient.     FozLavera GuiseD  Internal Medicine

## 2019-06-06 LAB — URINALYSIS, ROUTINE W REFLEX MICROSCOPIC
Bilirubin, UA: NEGATIVE
Glucose, UA: NEGATIVE
Ketones, UA: NEGATIVE
Leukocytes,UA: NEGATIVE
Nitrite, UA: NEGATIVE
Protein,UA: NEGATIVE
RBC, UA: NEGATIVE
Specific Gravity, UA: 1.02 (ref 1.005–1.030)
Urobilinogen, Ur: 1 mg/dL (ref 0.2–1.0)
pH, UA: 6.5 (ref 5.0–7.5)

## 2019-06-06 LAB — MICROALBUMIN, URINE: Microalbumin, Urine: 24.6 ug/mL

## 2019-06-08 ENCOUNTER — Other Ambulatory Visit: Payer: Self-pay

## 2019-06-08 ENCOUNTER — Other Ambulatory Visit: Payer: Self-pay | Admitting: Nurse Practitioner

## 2019-06-08 DIAGNOSIS — N644 Mastodynia: Secondary | ICD-10-CM

## 2019-06-09 ENCOUNTER — Other Ambulatory Visit: Payer: Self-pay

## 2019-06-09 ENCOUNTER — Encounter: Payer: Self-pay | Admitting: Oncology

## 2019-06-09 NOTE — Progress Notes (Signed)
Pt contacted for new patient visit. Referral for Anemia. No concerns voiced.

## 2019-06-10 ENCOUNTER — Other Ambulatory Visit: Payer: Self-pay

## 2019-06-10 ENCOUNTER — Encounter: Payer: Self-pay | Admitting: Oncology

## 2019-06-10 ENCOUNTER — Inpatient Hospital Stay: Payer: Medicaid Other | Attending: Oncology | Admitting: Oncology

## 2019-06-10 ENCOUNTER — Inpatient Hospital Stay: Payer: Medicaid Other

## 2019-06-10 VITALS — BP 125/84 | HR 76 | Temp 97.3°F | Resp 18

## 2019-06-10 DIAGNOSIS — Z720 Tobacco use: Secondary | ICD-10-CM

## 2019-06-10 DIAGNOSIS — D509 Iron deficiency anemia, unspecified: Secondary | ICD-10-CM | POA: Diagnosis not present

## 2019-06-10 DIAGNOSIS — D649 Anemia, unspecified: Secondary | ICD-10-CM

## 2019-06-10 DIAGNOSIS — F17210 Nicotine dependence, cigarettes, uncomplicated: Secondary | ICD-10-CM | POA: Diagnosis not present

## 2019-06-10 MED ORDER — FERROUS SULFATE 325 (65 FE) MG PO TABS: 325.0000 mg | ORAL_TABLET | Freq: Two times a day (BID) | ORAL | 3 refills | Status: DC

## 2019-06-10 NOTE — Progress Notes (Signed)
Hematology/Oncology Consult note [facility] [phone] [phone]   Patient Care Team: Ronnell Freshwater, NP as PCP - General (Family Medicine) Lavera Guise, MD (Internal Medicine) Christene Lye, MD (General Surgery)  REFERRING PROVIDER: Ronnell Freshwater, NP CHIEF COMPLAINTS/REASON FOR VISIT:  Evaluation of iron deficiency anemia  HISTORY OF PRESENTING ILLNESS:  Holly Espinoza is a  59 y.o.  female with PMH listed below was seen in consultation at the request of Ronnell Freshwater, NP   for evaluation of iron deficiency anemia.   Reviewed patient's recent labs  06/03/2019 Labs revealed anemia with hemoglobin of 12.7, mcv 82, iron panel showed iron saturation of 9, ferritin 14, TIBC 447.   Associated signs and symptoms: Patient reports fatigue. denies  SOB with exertion.  Denies weight loss, easy bruising, hematochezia, hemoptysis, She noticed blood when she wipes after urination. She has history of bladder prolapse.  Context:   Rectal bleeding: denies.  Menstrual bleeding/ Vaginal bleeding : history of hystectomy.  Hematemesis or hemoptysis : denies  She has started taking oral iron supplementation ferrous sulfate 356m daily, iron makes her nausea and upset stomach.  She also has severe constipation and is on Amitiza.   History of LCIS in 2014,s/p lumpectomy.  She also has breast concerns, PCP has ordered diagnostic mammogram  Review of Systems  Constitutional: Positive for fatigue. Negative for appetite change, chills and fever.  HENT:   Negative for hearing loss and voice change.   Eyes: Negative for eye problems.  Respiratory: Negative for chest tightness and cough.   Cardiovascular: Negative for chest pain.  Gastrointestinal: Positive for constipation. Negative for abdominal distention, abdominal pain and blood in stool.  Endocrine: Negative for hot flashes.  Genitourinary: Negative for difficulty urinating and  frequency.        Blood after urination. History of bladder prolapse.   Musculoskeletal: Positive for back pain. Negative for arthralgias.  Skin: Negative for itching and rash.  Neurological: Negative for extremity weakness.  Hematological: Negative for adenopathy.  Psychiatric/Behavioral: Negative for confusion.    MEDICAL HISTORY:  Past Medical History:  Diagnosis Date  . Anxiety   . Arthritis    "pretty much from my neck to my feet" (07/08/2012)  . B12 deficiency anemia   . Chronic lower back pain   . Depression   . Diabetic peripheral neuropathy ([place]   . Gout   . High cholesterol   . 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 Neuromuscular disorder ([place]   . Rheumatoid arthritis ([place]   . Type II diabetes mellitus (HCC)    recent hgb A1C was 5.4 [place]. [place]   SURGICAL HISTORY: Past Surgical History:  Procedure Laterality Date  . ABDOMINAL HYSTERECTOMY  2001?  . ANTERIOR AND POSTERIOR REPAIR N/A 11/01/2015   Procedure: CYSTOSCOPY, ANTERIOR (CYSTOCELE) AND POSTERIOR REPAIR (RECTOCELE);  Surgeon: SBjorn Loser MD;  Location: [facility];  Service: Urology;  Laterality: N/A;  . ANTERIOR CERVICAL DECOMP/DISCECTOMY FUSION  2012  . BACK SURGERY  2001   laminectomy  . BREAST BIOPSY Left 2014  . BREAST LUMPECTOMY Left 02/25/2013  . BREAST SURGERY Left 02/25/13   lumpectomy  . CHOLECYSTECTOMY  1980's  . DILATION AND CURETTAGE OF UTERUS  [phone]  "probably 3" (07/08/2012)  . FRACTURE SURGERY     C4-7 Surgery for fracture  . LUMBAR WOUND DEBRIDEMENT N/A 08/12/2012   Procedure: LUMBAR WOUND DEBRIDEMENT;  Surgeon: ROlga CoasterKritzer,  MD;  Location: [facility];  Service: Neurosurgery;  Laterality: N/A;  Lumbar Wound Debridement  . POSTERIOR LUMBAR FUSION  07/08/2012  . TONSILLECTOMY  ~ 1968    SOCIAL HISTORY: Social History   Socioeconomic History  . Marital status: Single    Spouse name: Not on file  . Number of children: Not on  file  . Years of education: Not on file  . Highest education level: Not on file  Occupational History  . Occupation: disability  Tobacco Use  . Smoking status: Current Every Day Smoker    Packs/day: 0.50    Years: 40.00    Pack years: 20.00    Types: Cigarettes  . Smokeless tobacco: Never Used  Substance and Sexual Activity  . Alcohol use: No  . Drug use: No    Types: Marijuana, Cocaine    Comment: 07/08/2012 "last drug use >8 yr ago" (07/08/2012)  . Sexual activity: Yes    Comment: quit a long time ago  Other Topics Concern  . Not on file  Social History Narrative  . Not on file   Social Determinants of Health   Financial Resource Strain:   . Difficulty of Paying Living Expenses:   Food Insecurity:   . Worried About Charity fundraiser in the Last Year:   . Arboriculturist in the Last Year:   Transportation Needs:   . Film/video editor (Medical):   Marland Kitchen Lack of Transportation (Non-Medical):   Physical Activity:   . Days of Exercise per Week:   . Minutes of Exercise per Session:   Stress:   . Feeling of Stress :   Social Connections:   . Frequency of Communication with Friends and Family:   . Frequency of Social Gatherings with Friends and Family:   . Attends Religious Services:   . Active Member of Clubs or Organizations:   . Attends Archivist Meetings:   Marland Kitchen Marital Status:   Intimate Partner Violence:   . Fear of Current or Ex-Partner:   . Emotionally Abused:   Marland Kitchen Physically Abused:   . Sexually Abused:     FAMILY HISTORY: Family History  Problem Relation Age of Onset  . Kidney disease Father   . Heart disease Father   . Diabetes Father   . Hypertension Mother   . Depression Mother   . Cancer Other        breast great grandmother  . Breast cancer Other     ALLERGIES:  has No Known Allergies.  MEDICATIONS:  Current Outpatient Medications  Medication Sig Dispense Refill  . allopurinol (ZYLOPRIM) 100 MG tablet Take 2 tab po at bedtime 30  tablet 3  . AMITIZA 24 MCG capsule Take 24 mcg by mouth 2 (two) times daily.    Marland Kitchen aspirin 81 MG chewable tablet Chew 81 mg by mouth daily.    . Colchicine (MITIGARE) 0.6 MG CAPS Take 1.2 mg by mouth See admin instructions. At onset of gout flare. May repeat one additional capsule 1 hour later if symptoms persist.    . Cyanocobalamin (B-12) 1000 MCG/ML KIT Inject 1,000 mcg as directed every 30 (thirty) days. 1 kit 3  . diazepam (VALIUM) 5 MG tablet Take 1/2 to 1 tablet po BID PRN only. 45 tablet 2  . DULoxetine (CYMBALTA) 60 MG capsule Take 1 capsule (60 mg total) by mouth 2 (two) times daily. 60 capsule 3  . ergocalciferol (DRISDOL) 1.25 MG (50000 UT) capsule Take 1 capsule (50,000  Units total) by mouth once a week. 4 capsule 5  . ferrous sulfate (FERROUSUL) 325 (65 FE) MG tablet Take 1 tablet (325 mg total) by mouth 2 (two) times daily with a meal. 60 tablet 3  . gemfibrozil (LOPID) 600 MG tablet Take 1 tablet (600 mg total) by mouth 2 (two) times daily before a meal. 60 tablet 3  . hydrochlorothiazide (HYDRODIURIL) 25 MG tablet Take 1 tablet (25 mg total) by mouth daily. 30 tablet 3  . Insulin Pen Needle (PEN NEEDLES 5/16") 30G X 8 MM MISC To use every day with victoza injections - E11.65 30 each 5  . levothyroxine (SYNTHROID) 75 MCG tablet Take 1 tablet (75 mcg total) by mouth daily before breakfast. 30 tablet 3  . liraglutide (VICTOZA) 18 MG/3ML SOPN Inject  1.8 mg Normal  daily 5 pen 3  . meloxicam (MOBIC) 7.5 MG tablet Take 1 tablet (7.5 mg total) by mouth 2 (two) times daily as needed for pain. 60 tablet 3  . omeprazole (PRILOSEC) 40 MG capsule Take 1 capsule (40 mg total) by mouth daily. 30 capsule 3  . ondansetron (ZOFRAN) 4 MG tablet Take 1 tablet (4 mg total) by mouth every 8 (eight) hours as needed for nausea. For nausea 20 tablet 0  . oxyCODONE-acetaminophen (PERCOCET) 10-325 MG tablet Take 1 tablet by mouth every 8 (eight) hours.    . OXYCONTIN 10 MG 12 hr tablet SMARTSIG:1 Tablet(s) By  Mouth Every 12 Hours    . pregabalin (LYRICA) 50 MG capsule Take 50 mg by mouth 2 (two) times daily.    . Syringe/Needle, Disp, (SYRINGE 3CC/20GX1") 20G X 1" 3 ML MISC To sue for B12 injections once monthly 1 each 11  . topiramate (TOPAMAX) 100 MG tablet Take 100 mg by mouth 2 (two) times daily.    . fexofenadine (ALLEGRA) 180 MG tablet Take 180 mg by mouth daily.    . fluconazole (DIFLUCAN) 150 MG tablet Take 1 tablet (150 mg total) by mouth daily. Take one tablet once and may repeat if symptoms persist in 3 days (Patient not taking: Reported on 06/09/2019) 3 tablet 0  . furosemide (LASIX) 20 MG tablet Take 1 tablet (20 mg total) by mouth daily as needed for fluid or edema. (Patient not taking: Reported on 06/09/2019) 30 tablet 3  . mirtazapine (REMERON) 15 MG tablet Take 1 tablet (15 mg total) by mouth at bedtime. (Patient not taking: Reported on 06/09/2019) 30 tablet 3  . mupirocin ointment (BACTROBAN) 2 % Place 1 application into the nose 2 (two) times daily. (Patient not taking: Reported on 06/09/2019) 22 g 0  . nystatin ointment (MYCOSTATIN) Apply 1 application topically 3 (three) times daily. (Patient not taking: Reported on 06/09/2019) 30 g 1  . predniSONE (STERAPRED UNI-PAK 48 TAB) 10 MG (48) TBPK tablet 12 day taper - take by mouth as directed for 12 days (Patient not taking: Reported on 06/09/2019) 48 tablet 0   No current facility-administered medications for this visit.     PHYSICAL EXAMINATION: ECOG PERFORMANCE STATUS: 1 - Symptomatic but completely ambulatory Vitals:   06/10/19 1513  BP: 125/84  Pulse: 76  Resp: 18  Temp: (!) 97.3 F (36.3 C)   There were no vitals filed for this visit.  Physical Exam Constitutional:      General: She is not in acute distress. HENT:     Head: Normocephalic and atraumatic.  Eyes:     General: No scleral icterus. Cardiovascular:     Rate and  Rhythm: Normal rate and regular rhythm.     Heart sounds: Normal heart sounds.  Pulmonary:      Effort: Pulmonary effort is normal. No respiratory distress.     Breath sounds: No wheezing.  Abdominal:     General: Bowel sounds are normal. There is no distension.     Palpations: Abdomen is soft.  Musculoskeletal:        General: No deformity. Normal range of motion.     Cervical back: Normal range of motion and neck supple.  Skin:    General: Skin is warm and dry.     Findings: No erythema or rash.  Neurological:     Mental Status: She is alert and oriented to person, place, and time. Mental status is at baseline.     Cranial Nerves: No cranial nerve deficit.     Coordination: Coordination normal.  Psychiatric:        Mood and Affect: Mood normal.       CMP Latest Ref Rng & Units 06/03/2019  Glucose 65 - 99 mg/dL 114(H)  BUN 6 - 24 mg/dL 16  Creatinine 0.57 - 1.00 mg/dL 1.19(H)  Sodium 134 - 144 mmol/L 141  Potassium 3.5 - 5.2 mmol/L 3.9  Chloride 96 - 106 mmol/L 106  CO2 20 - 29 mmol/L 22  Calcium 8.7 - 10.2 mg/dL 9.5  Total Protein 6.0 - 8.5 g/dL 6.8  Total Bilirubin 0.0 - 1.2 mg/dL 0.3  Alkaline Phos 39 - 117 IU/L 166(H)  AST 0 - 40 IU/L 25  ALT 0 - 32 IU/L 14   CBC Latest Ref Rng & Units 06/03/2019  WBC 3.4 - 10.8 x10E3/uL 5.5  Hemoglobin 11.1 - 15.9 g/dL 12.7  Hematocrit 34.0 - 46.6 % 38.6  Platelets 150 - 450 x10E3/uL 294     LABORATORY DATA:  I have reviewed the data as listed Lab Results  Component Value Date   WBC 5.5 06/03/2019   HGB 12.7 06/03/2019   HCT 38.6 06/03/2019   MCV 82 06/03/2019   PLT 294 06/03/2019   Recent Labs    06/03/19 1147  NA 141  K 3.9  CL 106  CO2 22  GLUCOSE 114*  BUN 16  CREATININE 1.19*  CALCIUM 9.5  GFRNONAA 50*  GFRAA 58*  PROT 6.8  ALBUMIN 4.2  AST 25  ALT 14  ALKPHOS 166*  BILITOT 0.3   Iron/TIBC/Ferritin/ %Sat    Component Value Date/Time   IRON 40 06/03/2019 1147   IRON 75 01/15/2013 1415   TIBC 447 06/03/2019 1147   TIBC 336 01/15/2013 1415   FERRITIN 14 (L) 06/03/2019 1147   FERRITIN 255  01/15/2013 1415   IRONPCTSAT 9 (LL) 06/03/2019 1147   IRONPCTSAT 22 01/15/2013 1415     RADIOGRAPHIC STUDIES: I have personally reviewed the radiological images as listed and agreed with the findings in the report. No results found.     ASSESSMENT & PLAN:  1. Anemia, unspecified type   2. Iron deficiency anemia, unspecified iron deficiency anemia type   3. Tobacco abuse     Labs are reviewed and discussed with patient. Consistent with iron deficiency anemia. She does not tolerate oral iron supplementation well. I recommend IV venofer.  Plan IV iron with Venofer 265m weekly x 1 dose.. Allergy reactions/infusion reaction including anaphylactic reaction discussed with patient. Other side effects include but not limited to high blood pressure, skin rash, weight gain, leg swelling, etc. Patient voices understanding and willing to proceed.  Blood  after urination, recommend patient to establish care with urologist for evaluation.  tobacco abuse, smoke cessation was discussed with patient.  Recommend lung cancer screening program.   History of LCIS. Pending mammogram surveillance evaluation.   Orders Placed This Encounter  Procedures  . CBC with Differential/Platelet    Standing Status:   Future    Standing Expiration Date:   06/09/2020  . Ferritin    Standing Status:   Future    Standing Expiration Date:   06/09/2020  . Iron and TIBC    Standing Status:   Future    Standing Expiration Date:   06/09/2020  . Vitamin B12    Standing Status:   Future    Standing Expiration Date:   06/09/2020  . Folate    Standing Status:   Future    Standing Expiration Date:   06/09/2020    All questions were answered. The patient knows to call the clinic with any problems questions or concerns.  Cc Ronnell Freshwater, NP  Return of visit: 4 weeks.  Thank you for this kind referral and the opportunity to participate in the care of this patient. A copy of today's note is routed to referring provider    Earlie Server, MD, PhD Hematology Oncology [place] [facility] 06/10/2019

## 2019-06-17 ENCOUNTER — Ambulatory Visit
Admission: RE | Admit: 2019-06-17 | Discharge: 2019-06-17 | Disposition: A | Discharge status: Home / Self Care | Payer: Medicaid Other | Source: Ambulatory Visit | Attending: Nurse Practitioner | Admitting: Nurse Practitioner

## 2019-06-17 ENCOUNTER — Other Ambulatory Visit: Payer: Self-pay | Admitting: Oncology

## 2019-06-17 ENCOUNTER — Encounter: Payer: Self-pay | Admitting: Oncology

## 2019-06-17 DIAGNOSIS — N644 Mastodynia: Secondary | ICD-10-CM | POA: Insufficient documentation

## 2019-06-17 DIAGNOSIS — D509 Iron deficiency anemia, unspecified: Secondary | ICD-10-CM

## 2019-06-17 HISTORY — DX: Iron deficiency anemia, unspecified: D50.9

## 2019-06-18 ENCOUNTER — Other Ambulatory Visit: Payer: Self-pay

## 2019-06-18 ENCOUNTER — Inpatient Hospital Stay: Payer: Medicaid Other | Attending: Oncology

## 2019-06-18 DIAGNOSIS — R5383 Other fatigue: Secondary | ICD-10-CM | POA: Insufficient documentation

## 2019-06-18 DIAGNOSIS — D509 Iron deficiency anemia, unspecified: Secondary | ICD-10-CM | POA: Diagnosis not present

## 2019-06-18 MED ORDER — SODIUM CHLORIDE 0.9 % IV SOLN: 200.0000 mg | Freq: Once | INTRAVENOUS | Status: DC

## 2019-06-18 MED ORDER — IRON SUCROSE 20 MG/ML IV SOLN
200.0000 mg | Freq: Once | INTRAVENOUS | Status: AC
  Administered 2019-06-18: 200 mg via INTRAVENOUS
  Filled 2019-06-18: qty 10

## 2019-06-18 MED ORDER — SODIUM CHLORIDE 0.9 % IV SOLN
Freq: Once | INTRAVENOUS | Status: AC
  Filled 2019-06-18: qty 250

## 2019-06-20 DIAGNOSIS — N644 Mastodynia: Secondary | ICD-10-CM | POA: Insufficient documentation

## 2019-06-20 DIAGNOSIS — E559 Vitamin D deficiency, unspecified: Secondary | ICD-10-CM | POA: Insufficient documentation

## 2019-06-20 DIAGNOSIS — Z1231 Encounter for screening mammogram for malignant neoplasm of breast: Secondary | ICD-10-CM | POA: Insufficient documentation

## 2019-06-21 NOTE — Progress Notes (Signed)
Negative mammogram

## 2019-07-06 ENCOUNTER — Inpatient Hospital Stay: Payer: Medicaid Other

## 2019-07-06 ENCOUNTER — Other Ambulatory Visit: Payer: Self-pay

## 2019-07-06 DIAGNOSIS — D509 Iron deficiency anemia, unspecified: Secondary | ICD-10-CM | POA: Diagnosis not present

## 2019-07-06 DIAGNOSIS — D649 Anemia, unspecified: Secondary | ICD-10-CM

## 2019-07-06 LAB — CBC WITH DIFFERENTIAL/PLATELET
Abs Immature Granulocytes: 0.01 10*3/uL (ref 0.00–0.07)
Basophils Absolute: 0 10*3/uL (ref 0.0–0.1)
Basophils Relative: 1 %
Eosinophils Absolute: 0.2 10*3/uL (ref 0.0–0.5)
Eosinophils Relative: 5 %
HCT: 40.5 % (ref 36.0–46.0)
Hemoglobin: 13 g/dL (ref 12.0–15.0)
Immature Granulocytes: 0 %
Lymphocytes Relative: 38 %
Lymphs Abs: 1.8 10*3/uL (ref 0.7–4.0)
MCH: 27.4 pg (ref 26.0–34.0)
MCHC: 32.1 g/dL (ref 30.0–36.0)
MCV: 85.3 fL (ref 80.0–100.0)
Monocytes Absolute: 0.3 10*3/uL (ref 0.1–1.0)
Monocytes Relative: 7 %
Neutro Abs: 2.3 10*3/uL (ref 1.7–7.7)
Neutrophils Relative %: 49 %
Platelets: 203 10*3/uL (ref 150–400)
RBC: 4.75 MIL/uL (ref 3.87–5.11)
RDW: 14.1 % (ref 11.5–15.5)
WBC: 4.7 10*3/uL (ref 4.0–10.5)
nRBC: 0 % (ref 0.0–0.2)

## 2019-07-06 LAB — IRON AND TIBC
Iron: 59 ug/dL (ref 28–170)
Saturation Ratios: 13 % (ref 10.4–31.8)
TIBC: 472 ug/dL — ABNORMAL HIGH (ref 250–450)
UIBC: 413 ug/dL

## 2019-07-06 LAB — FERRITIN: Ferritin: 30 ng/mL (ref 11–307)

## 2019-07-06 LAB — VITAMIN B12: Vitamin B-12: 334 pg/mL (ref 180–914)

## 2019-07-06 LAB — FOLATE: Folate: 11 ng/mL (ref >5.9)

## 2019-07-07 ENCOUNTER — Inpatient Hospital Stay (HOSPITAL_BASED_OUTPATIENT_CLINIC_OR_DEPARTMENT_OTHER): Payer: Medicaid Other | Admitting: Oncology

## 2019-07-07 ENCOUNTER — Encounter: Payer: Self-pay | Admitting: Oncology

## 2019-07-07 ENCOUNTER — Inpatient Hospital Stay: Payer: Medicaid Other

## 2019-07-07 VITALS — BP 130/87 | HR 79 | Temp 97.5°F | Resp 18 | Wt 208.8 lb

## 2019-07-07 DIAGNOSIS — E611 Iron deficiency: Secondary | ICD-10-CM | POA: Diagnosis not present

## 2019-07-07 DIAGNOSIS — R5383 Other fatigue: Secondary | ICD-10-CM

## 2019-07-07 DIAGNOSIS — Z86000 Personal history of in-situ neoplasm of breast: Secondary | ICD-10-CM

## 2019-07-07 DIAGNOSIS — D509 Iron deficiency anemia, unspecified: Secondary | ICD-10-CM | POA: Diagnosis not present

## 2019-07-07 NOTE — Progress Notes (Signed)
Patient here for follow up. Pt reports feeling tired with no energy.

## 2019-07-07 NOTE — Progress Notes (Signed)
Hematology/Oncology Consult note [facility] Telephone:([phone] [phone]   Patient Care Team: Ronnell Freshwater, NP as PCP - General (Family Medicine) Lavera Guise, MD (Internal Medicine) Christene Lye, MD (General Surgery)  REFERRING PROVIDER: Ronnell Freshwater, NP CHIEF COMPLAINTS/REASON FOR VISIT:  Follow-up for iron deficiency  HISTORY OF PRESENTING ILLNESS:  Holly Espinoza is a  59 y.o.  female with PMH listed below was seen in consultation at the request of Ronnell Freshwater, NP   for evaluation of iron deficiency anemia.   Reviewed patient's recent labs  06/03/2019 Labs revealed anemia with hemoglobin of 12.7, mcv 82, iron panel showed iron saturation of 9, ferritin 14, TIBC 447.   Associated signs and symptoms: Patient reports fatigue. denies  SOB with exertion.  Denies weight loss, easy bruising, hematochezia, hemoptysis, She noticed blood when she wipes after urination. She has history of bladder prolapse.  Context:   Rectal bleeding: denies.  Menstrual bleeding/ Vaginal bleeding : history of hystectomy.  Hematemesis or hemoptysis : denies  She has started taking oral iron supplementation ferrous sulfate 383m daily, iron makes her nausea and upset stomach.  She also has severe constipation and is on Amitiza.   History of LCIS in 2014,s/p lumpectomy.  She also has breast concerns, PCP has ordered diagnostic mammogram  [place] 59[place] who has above history reviewed by me today presents for follow up visit for iron deficiency Problems and complaints are listed below: Patient continues to feel fatigued.  She also feels not refreshed in the morning.  She received 1 dose of IV Venofer during interval.  She feels no improvement of her fatigue. Patient reports a chronic history of " blood in urine".  UA 06/05/2019 was negative for RBC.   Review of Systems  Constitutional: Positive for  fatigue. Negative for appetite change, chills and fever.  HENT:   Negative for hearing loss and voice change.   Eyes: Negative for eye problems.  Respiratory: Negative for chest tightness and cough.   Cardiovascular: Negative for chest pain.  Gastrointestinal: Positive for constipation. Negative for abdominal distention, abdominal pain and blood in stool.  Endocrine: Negative for hot flashes.  Genitourinary: Negative for difficulty urinating and frequency.        Blood after urination. History of bladder prolapse.   Musculoskeletal: Positive for back pain. Negative for arthralgias.  Skin: Negative for itching and rash.  Neurological: Negative for extremity weakness.  Hematological: Negative for adenopathy.  Psychiatric/Behavioral: Negative for confusion.    MEDICAL HISTORY:  Past Medical History:  Diagnosis Date  . Anxiety   . Arthritis    "pretty much from my neck to my feet" (07/08/2012)  . B12 deficiency anemia   . Chronic lower back pain   . Depression   . Diabetic peripheral neuropathy ([place]   . Gout   . High cholesterol   . IDA (iron deficiency anemia) 06/17/2019  . 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 Neuromuscular disorder ([place]   . Rheumatoid arthritis ([place]   . Type II diabetes mellitus (HCC)    recent hgb A1C was 5.4 [place]   SURGICAL HISTORY: Past Surgical History:  Procedure Laterality Date  . ABDOMINAL HYSTERECTOMY  2001?  . ANTERIOR AND POSTERIOR REPAIR N/A 11/01/2015   Procedure: CYSTOSCOPY, ANTERIOR (CYSTOCELE) AND POSTERIOR REPAIR (RECTOCELE);  Surgeon: SBjorn Loser MD;  Location: [facility];  Service: Urology;  Laterality: N/A;  . ANTERIOR CERVICAL DECOMP/DISCECTOMY FUSION  2012  . BACK SURGERY  2001   laminectomy  . BREAST BIOPSY Left 2014  . BREAST LUMPECTOMY Left 02/25/2013  . BREAST SURGERY Left 02/25/13   lumpectomy  . CHOLECYSTECTOMY  1980's  . DILATION AND CURETTAGE OF UTERUS  [phone]    "probably 3" (07/08/2012)  . FRACTURE SURGERY     C4-7 Surgery for fracture  . LUMBAR WOUND DEBRIDEMENT N/A 08/12/2012   Procedure: LUMBAR WOUND DEBRIDEMENT;  Surgeon: Faythe Ghee, MD;  Location: [place] [facility];  Service: Neurosurgery;  Laterality: N/A;  Lumbar Wound Debridement  . POSTERIOR LUMBAR FUSION  07/08/2012  . TONSILLECTOMY  ~ 1968    SOCIAL HISTORY: Social History   Socioeconomic History  . Marital status: Single    Spouse name: Not on file  . Number of children: Not on file  . Years of education: Not on file  . Highest education level: Not on file  Occupational History  . Occupation: disability  Tobacco Use  . Smoking status: Current Every Day Smoker    Packs/day: 0.50    Years: 40.00    Pack years: 20.00    Types: Cigarettes  . Smokeless tobacco: Never Used  Substance and Sexual Activity  . Alcohol use: No  . Drug use: No    Types: Marijuana, Cocaine    Comment: 07/08/2012 "last drug use >8 yr ago" (07/08/2012)  . Sexual activity: Yes    Comment: quit a long time ago  Other Topics Concern  . Not on file  Social History Narrative  . Not on file   Social Determinants of Health   Financial Resource Strain:   . Difficulty of Paying Living Expenses:   Food Insecurity:   . Worried About Charity fundraiser in the Last Year:   . Arboriculturist in the Last Year:   Transportation Needs:   . Film/video editor (Medical):   Holly Espinoza Lack of Transportation (Non-Medical):   Physical Activity:   . Days of Exercise per Week:   . Minutes of Exercise per Session:   Stress:   . Feeling of Stress :   Social Connections:   . Frequency of Communication with Friends and Family:   . Frequency of Social Gatherings with Friends and Family:   . Attends Religious Services:   . Active Member of Clubs or Organizations:   . Attends Archivist Meetings:   Holly Espinoza Marital Status:   Intimate Partner Violence:   . Fear of Current or Ex-Partner:   . Emotionally Abused:   Holly Espinoza  Physically Abused:   . Sexually Abused:     FAMILY HISTORY: Family History  Problem Relation Age of Onset  . Kidney disease Father   . Heart disease Father   . Diabetes Father   . Hypertension Mother   . Depression Mother   . Cancer Other        breast great grandmother  . Breast cancer Other     ALLERGIES:  has No Known Allergies.  MEDICATIONS:  Current Outpatient Medications  Medication Sig Dispense Refill  . allopurinol (ZYLOPRIM) 100 MG tablet Take 2 tab po at bedtime 30 tablet 3  . AMITIZA 24 MCG capsule Take 24 mcg by mouth 2 (two) times daily.    Holly Espinoza aspirin 81 MG chewable tablet Chew 81 mg by mouth daily.    . Colchicine (MITIGARE) 0.6 MG CAPS Take 1.2 mg by mouth See admin instructions. At onset  of gout flare. May repeat one additional capsule 1 hour later if symptoms persist.    . Cyanocobalamin (B-12) 1000 MCG/ML KIT Inject 1,000 mcg as directed every 30 (thirty) days. 1 kit 3  . diazepam (VALIUM) 5 MG tablet Take 1/2 to 1 tablet po BID PRN only. 45 tablet 2  . DULoxetine (CYMBALTA) 60 MG capsule Take 1 capsule (60 mg total) by mouth 2 (two) times daily. 60 capsule 3  . ergocalciferol (DRISDOL) 1.25 MG (50000 UT) capsule Take 1 capsule (50,000 Units total) by mouth once a week. 4 capsule 5  . ferrous sulfate (FERROUSUL) 325 (65 FE) MG tablet Take 1 tablet (325 mg total) by mouth 2 (two) times daily with a meal. 60 tablet 3  . gemfibrozil (LOPID) 600 MG tablet Take 1 tablet (600 mg total) by mouth 2 (two) times daily before a meal. 60 tablet 3  . hydrochlorothiazide (HYDRODIURIL) 25 MG tablet Take 1 tablet (25 mg total) by mouth daily. 30 tablet 3  . levothyroxine (SYNTHROID) 75 MCG tablet Take 1 tablet (75 mcg total) by mouth daily before breakfast. 30 tablet 3  . liraglutide (VICTOZA) 18 MG/3ML SOPN Inject  1.8 mg   daily 5 pen 3  . meloxicam (MOBIC) 7.5 MG tablet Take 1 tablet (7.5 mg total) by mouth 2 (two) times daily as needed for pain. 60 tablet 3  . omeprazole  (PRILOSEC) 40 MG capsule Take 1 capsule (40 mg total) by mouth daily. 30 capsule 3  . ondansetron (ZOFRAN) 4 MG tablet Take 1 tablet (4 mg total) by mouth every 8 (eight) hours as needed for nausea. For nausea 20 tablet 0  . oxyCODONE-acetaminophen (PERCOCET) 10-325 MG tablet Take 1 tablet by mouth every 8 (eight) hours.    . OXYCONTIN 10 MG 12 hr tablet SMARTSIG:1 Tablet(s) By Mouth Every 12 Hours    . pregabalin (LYRICA) 50 MG capsule Take 50 mg by mouth 2 (two) times daily.    Holly Espinoza topiramate (TOPAMAX) 100 MG tablet Take 100 mg by mouth 2 (two) times daily.    . fexofenadine (ALLEGRA) 180 MG tablet Take 180 mg by mouth daily.    . fluconazole (DIFLUCAN) 150 MG tablet Take 1 tablet (150 mg total) by mouth daily. Take one tablet once and may repeat if symptoms persist in 3 days (Patient not taking: Reported on 06/09/2019) 3 tablet 0  . furosemide (LASIX) 20 MG tablet Take 1 tablet (20 mg total) by mouth daily as needed for fluid or edema. (Patient not taking: Reported on 06/09/2019) 30 tablet 3  . Insulin Pen Needle (PEN NEEDLES 5/16") 30G X 8 MM MISC To use every day with victoza injections - E11.65 (Patient not taking: Reported on 07/07/2019) 30 each 5  . mirtazapine (REMERON) 15 MG tablet Take 1 tablet (15 mg total) by mouth at bedtime. (Patient not taking: Reported on 06/09/2019) 30 tablet 3  . mupirocin ointment (BACTROBAN) 2 % Place 1 application into the nose 2 (two) times daily. (Patient not taking: Reported on 06/09/2019) 22 g 0  . nystatin ointment (MYCOSTATIN) Apply 1 application topically 3 (three) times daily. (Patient not taking: Reported on 06/09/2019) 30 g 1  . Syringe/Needle, Disp, (SYRINGE 3CC/20GX1") 20G X 1" 3 ML MISC To sue for B12 injections once monthly (Patient not taking: Reported on 07/07/2019) 1 each 11   No current facility-administered medications for this visit.     PHYSICAL EXAMINATION: ECOG PERFORMANCE STATUS: 1 - Symptomatic but completely ambulatory Vitals:   07/07/19  1407  BP: 130/87  Pulse: 79  Resp: 18  Temp: (!) 97.5 F (36.4 C)   Filed Weights   07/07/19 1407  Weight: 208 lb 12.8 oz (94.7 kg)    Physical Exam Constitutional:      General: She is not in acute distress. HENT:     Head: Normocephalic and atraumatic.  Eyes:     General: No scleral icterus. Cardiovascular:     Rate and Rhythm: Normal rate and regular rhythm.     Heart sounds: Normal heart sounds.  Pulmonary:     Effort: Pulmonary effort is normal. No respiratory distress.     Breath sounds: No wheezing.  Abdominal:     General: Bowel sounds are normal. There is no distension.     Palpations: Abdomen is soft.  Musculoskeletal:        General: No deformity. Normal range of motion.     Cervical back: Normal range of motion and neck supple.  Skin:    General: Skin is warm and dry.     Findings: No erythema or rash.  Neurological:     Mental Status: She is alert and oriented to person, place, and time. Mental status is at baseline.     Cranial Nerves: No cranial nerve deficit.     Coordination: Coordination normal.  Psychiatric:        Mood and Affect: Mood normal.       CMP Latest Ref Rng & Units 06/03/2019  Glucose 65 - 99 mg/dL 114(H)  BUN 6 - 24 mg/dL 16  Creatinine 0.57 - 1.00 mg/dL 1.19(H)  Sodium 134 - 144 mmol/L 141  Potassium 3.5 - 5.2 mmol/L 3.9  Chloride 96 - 106 mmol/L 106  CO2 20 - 29 mmol/L 22  Calcium 8.7 - 10.2 mg/dL 9.5  Total Protein 6.0 - 8.5 g/dL 6.8  Total Bilirubin 0.0 - 1.2 mg/dL 0.3  Alkaline Phos 39 - 117 IU/L 166(H)  AST 0 - 40 IU/L 25  ALT 0 - 32 IU/L 14   CBC Latest Ref Rng & Units 07/06/2019  WBC 4.0 - 10.5 K/uL 4.7  Hemoglobin 12.0 - 15.0 g/dL 13.0  Hematocrit 36.0 - 46.0 % 40.5  Platelets 150 - 400 K/uL 203     LABORATORY DATA:  I have reviewed the data as listed Lab Results  Component Value Date   WBC 4.7 07/06/2019   HGB 13.0 07/06/2019   HCT 40.5 07/06/2019   MCV 85.3 07/06/2019   PLT 203 07/06/2019   Recent  Labs    06/03/19 1147  NA 141  K 3.9  CL 106  CO2 22  GLUCOSE 114*  BUN 16  CREATININE 1.19*  CALCIUM 9.5  GFRNONAA 50*  GFRAA 58*  PROT 6.8  ALBUMIN 4.2  AST 25  ALT 14  ALKPHOS 166*  BILITOT 0.3   Iron/TIBC/Ferritin/ %Sat    Component Value Date/Time   IRON 59 07/06/2019 1404   IRON 40 06/03/2019 1147   IRON 75 01/15/2013 1415   TIBC 472 (H) 07/06/2019 1404   TIBC 447 06/03/2019 1147   TIBC 336 01/15/2013 1415   FERRITIN 30 07/06/2019 1404   FERRITIN 14 (L) 06/03/2019 1147   FERRITIN 255 01/15/2013 1415   IRONPCTSAT 13 07/06/2019 1404   IRONPCTSAT 9 (LL) 06/03/2019 1147   IRONPCTSAT 22 01/15/2013 1415     RADIOGRAPHIC STUDIES: I have personally reviewed the radiological images as listed and agreed with the findings in the report. US BREAST LTD UNI RIGHT  INC AXILLA  Result Date: 06/17/2019 CLINICAL DATA:  Patient complains of focal right breast pain. EXAM: DIGITAL DIAGNOSTIC BILATERAL MAMMOGRAM WITH CAD AND TOMO ULTRASOUND RIGHT BREAST COMPARISON:  Previous exam(s). ACR Breast Density Category b: There are scattered areas of fibroglandular density. FINDINGS: No suspicious mass, malignant type microcalcifications or distortion detected in either breast. Spot tangential view of the area of clinical concern shows normal fibroglandular tissue. Mammographic images were processed with CAD. On physical exam, I do not palpate a mass in the area of clinical concern in the 5-6 o'clock region of the right breast 8 cm from the nipple. Targeted ultrasound is performed, showing normal tissue in the area of clinical concern in the 5-6 o'clock region of the right breast 8 cm from the nipple. IMPRESSION: No evidence of malignancy in either breast. RECOMMENDATION: Bilateral screening mammogram in 1 year is recommended. I have discussed the findings and recommendations with the patient. If applicable, a reminder letter will be sent to the patient regarding the next appointment. BI-RADS CATEGORY   1: Negative. Electronically Signed   By: Lillia Mountain M.D.   On: 06/17/2019 16:07   MM DIAG BREAST TOMO BILATERAL  Result Date: 06/17/2019 CLINICAL DATA:  Patient complains of focal right breast pain. EXAM: DIGITAL DIAGNOSTIC BILATERAL MAMMOGRAM WITH CAD AND TOMO ULTRASOUND RIGHT BREAST COMPARISON:  Previous exam(s). ACR Breast Density Category b: There are scattered areas of fibroglandular density. FINDINGS: No suspicious mass, malignant type microcalcifications or distortion detected in either breast. Spot tangential view of the area of clinical concern shows normal fibroglandular tissue. Mammographic images were processed with CAD. On physical exam, I do not palpate a mass in the area of clinical concern in the 5-6 o'clock region of the right breast 8 cm from the nipple. Targeted ultrasound is performed, showing normal tissue in the area of clinical concern in the 5-6 o'clock region of the right breast 8 cm from the nipple. IMPRESSION: No evidence of malignancy in either breast. RECOMMENDATION: Bilateral screening mammogram in 1 year is recommended. I have discussed the findings and recommendations with the patient. If applicable, a reminder letter will be sent to the patient regarding the next appointment. BI-RADS CATEGORY  1: Negative. Electronically Signed   By: Lillia Mountain M.D.   On: 06/17/2019 16:07       ASSESSMENT & PLAN:  1. Iron deficiency   2. Other fatigue   3. 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 Labs are reviewed and discussed with patient. Iron store has normalized.  She never had any anemia.  Hemoglobin is stable and normal. I discussed with patient and encourage patient to proceed with sleep study. She does not feel refreshed in the morning after sleep.  Hold off IV iron at this point. I discussed with her that her fatigue is not due to anemia or iron deficiency.  Fatigue may be  secondary to underlying undiagnosed sleep apnea.  Patient complains of history of chronic dysuria, " blood in urine".  History of bladder prolapse. Patient previously followed up with urology and prefers to establish care with a local urology group. I recommend patient to discuss with primary care provider  Recommend lung cancer screening program.   History of LCIS. mammogram surveillance was independent reviewed by me discussed with patient.  She is due for mammogram in 1 year.  Orders Placed This Encounter  Procedures  . CBC with  Differential/Platelet    Standing Status:   Future    Standing Expiration Date:   07/06/2020  . Ferritin    Standing Status:   Future    Standing Expiration Date:   07/06/2020  . Iron and TIBC    Standing Status:   Future    Standing Expiration Date:   07/06/2020    All questions were answered. The patient knows to call the clinic with any problems questions or concerns.  Cc Ronnell Freshwater, NP  Return of visit: 4 months  Earlie Server, MD, PhD Hematology Oncology [facility] at [facility] 07/07/2019

## 2019-07-15 ENCOUNTER — Other Ambulatory Visit: Payer: Self-pay

## 2019-07-15 ENCOUNTER — Encounter: Payer: Self-pay | Admitting: Emergency Medicine

## 2019-07-15 ENCOUNTER — Observation Stay
Admission: EM | Admit: 2019-07-15 | Discharge: 2019-07-16 | Disposition: A | Discharge status: Home / Self Care | Payer: Medicaid Other | Attending: Internal Medicine | Admitting: Internal Medicine

## 2019-07-15 ENCOUNTER — Emergency Department: Payer: Medicaid Other

## 2019-07-15 DIAGNOSIS — M109 Gout, unspecified: Secondary | ICD-10-CM | POA: Diagnosis not present

## 2019-07-15 DIAGNOSIS — R4182 Altered mental status, unspecified: Secondary | ICD-10-CM | POA: Diagnosis not present

## 2019-07-15 DIAGNOSIS — Z9049 Acquired absence of other specified parts of digestive tract: Secondary | ICD-10-CM | POA: Diagnosis not present

## 2019-07-15 DIAGNOSIS — Z8249 Family history of ischemic heart disease and other diseases of the circulatory system: Secondary | ICD-10-CM | POA: Insufficient documentation

## 2019-07-15 DIAGNOSIS — Z794 Long term (current) use of insulin: Secondary | ICD-10-CM | POA: Insufficient documentation

## 2019-07-15 DIAGNOSIS — Z79899 Other long term (current) drug therapy: Secondary | ICD-10-CM | POA: Diagnosis not present

## 2019-07-15 DIAGNOSIS — M25561 Pain in right knee: Secondary | ICD-10-CM | POA: Diagnosis not present

## 2019-07-15 DIAGNOSIS — W19XXXA Unspecified fall, initial encounter: Secondary | ICD-10-CM | POA: Insufficient documentation

## 2019-07-15 DIAGNOSIS — S0990XA Unspecified injury of head, initial encounter: Secondary | ICD-10-CM | POA: Diagnosis not present

## 2019-07-15 DIAGNOSIS — F4310 Post-traumatic stress disorder, unspecified: Secondary | ICD-10-CM | POA: Diagnosis not present

## 2019-07-15 DIAGNOSIS — Z853 Personal history of malignant neoplasm of breast: Secondary | ICD-10-CM | POA: Diagnosis not present

## 2019-07-15 DIAGNOSIS — G9340 Encephalopathy, unspecified: Secondary | ICD-10-CM | POA: Diagnosis present

## 2019-07-15 DIAGNOSIS — Z803 Family history of malignant neoplasm of breast: Secondary | ICD-10-CM | POA: Insufficient documentation

## 2019-07-15 DIAGNOSIS — K219 Gastro-esophageal reflux disease without esophagitis: Secondary | ICD-10-CM | POA: Diagnosis not present

## 2019-07-15 DIAGNOSIS — F17210 Nicotine dependence, cigarettes, uncomplicated: Secondary | ICD-10-CM | POA: Diagnosis not present

## 2019-07-15 DIAGNOSIS — Z791 Long term (current) use of non-steroidal anti-inflammatories (NSAID): Secondary | ICD-10-CM | POA: Insufficient documentation

## 2019-07-15 DIAGNOSIS — E7800 Pure hypercholesterolemia, unspecified: Secondary | ICD-10-CM | POA: Insufficient documentation

## 2019-07-15 DIAGNOSIS — G8929 Other chronic pain: Secondary | ICD-10-CM | POA: Diagnosis not present

## 2019-07-15 DIAGNOSIS — Z981 Arthrodesis status: Secondary | ICD-10-CM | POA: Insufficient documentation

## 2019-07-15 DIAGNOSIS — Z20822 Contact with and (suspected) exposure to covid-19: Secondary | ICD-10-CM | POA: Diagnosis not present

## 2019-07-15 DIAGNOSIS — E1142 Type 2 diabetes mellitus with diabetic polyneuropathy: Secondary | ICD-10-CM | POA: Insufficient documentation

## 2019-07-15 DIAGNOSIS — E785 Hyperlipidemia, unspecified: Secondary | ICD-10-CM | POA: Insufficient documentation

## 2019-07-15 DIAGNOSIS — F411 Generalized anxiety disorder: Secondary | ICD-10-CM | POA: Insufficient documentation

## 2019-07-15 DIAGNOSIS — M069 Rheumatoid arthritis, unspecified: Secondary | ICD-10-CM | POA: Diagnosis not present

## 2019-07-15 DIAGNOSIS — Z7982 Long term (current) use of aspirin: Secondary | ICD-10-CM | POA: Diagnosis not present

## 2019-07-15 DIAGNOSIS — Z90710 Acquired absence of both cervix and uterus: Secondary | ICD-10-CM | POA: Insufficient documentation

## 2019-07-15 DIAGNOSIS — M545 Low back pain: Secondary | ICD-10-CM | POA: Diagnosis not present

## 2019-07-15 DIAGNOSIS — F419 Anxiety disorder, unspecified: Secondary | ICD-10-CM | POA: Insufficient documentation

## 2019-07-15 DIAGNOSIS — Z818 Family history of other mental and behavioral disorders: Secondary | ICD-10-CM | POA: Insufficient documentation

## 2019-07-15 DIAGNOSIS — M25562 Pain in left knee: Secondary | ICD-10-CM | POA: Insufficient documentation

## 2019-07-15 DIAGNOSIS — Z833 Family history of diabetes mellitus: Secondary | ICD-10-CM | POA: Insufficient documentation

## 2019-07-15 DIAGNOSIS — D509 Iron deficiency anemia, unspecified: Secondary | ICD-10-CM | POA: Insufficient documentation

## 2019-07-15 DIAGNOSIS — Z841 Family history of disorders of kidney and ureter: Secondary | ICD-10-CM | POA: Insufficient documentation

## 2019-07-15 LAB — CBC WITH DIFFERENTIAL/PLATELET
Abs Immature Granulocytes: 0.02 10*3/uL (ref 0.00–0.07)
Basophils Absolute: 0.1 10*3/uL (ref 0.0–0.1)
Basophils Relative: 1 %
Eosinophils Absolute: 0.3 10*3/uL (ref 0.0–0.5)
Eosinophils Relative: 4 %
HCT: 43.9 % (ref 36.0–46.0)
Hemoglobin: 13.9 g/dL (ref 12.0–15.0)
Immature Granulocytes: 0 %
Lymphocytes Relative: 34 %
Lymphs Abs: 2.7 10*3/uL (ref 0.7–4.0)
MCH: 27.2 pg (ref 26.0–34.0)
MCHC: 31.7 g/dL (ref 30.0–36.0)
MCV: 85.9 fL (ref 80.0–100.0)
Monocytes Absolute: 0.5 10*3/uL (ref 0.1–1.0)
Monocytes Relative: 6 %
Neutro Abs: 4.3 10*3/uL (ref 1.7–7.7)
Neutrophils Relative %: 55 %
Platelets: 222 10*3/uL (ref 150–400)
RBC: 5.11 MIL/uL (ref 3.87–5.11)
RDW: 14.2 % (ref 11.5–15.5)
WBC: 7.8 10*3/uL (ref 4.0–10.5)
nRBC: 0 % (ref 0.0–0.2)

## 2019-07-15 LAB — BASIC METABOLIC PANEL
Anion gap: 7 (ref 5–15)
BUN: 25 mg/dL — ABNORMAL HIGH (ref 6–20)
CO2: 26 mmol/L (ref 22–32)
Calcium: 9.4 mg/dL (ref 8.9–10.3)
Chloride: 102 mmol/L (ref 98–111)
Creatinine, Ser: 1.27 mg/dL — ABNORMAL HIGH (ref 0.44–1.00)
GFR calc Af Amer: 53 mL/min — ABNORMAL LOW (ref >60)
GFR calc non Af Amer: 46 mL/min — ABNORMAL LOW (ref >60)
Glucose, Bld: 105 mg/dL — ABNORMAL HIGH (ref 70–99)
Potassium: 3.5 mmol/L (ref 3.5–5.1)
Sodium: 135 mmol/L (ref 135–145)

## 2019-07-15 LAB — ETHANOL: Alcohol, Ethyl (B): <10 mg/dL (ref <10)

## 2019-07-15 NOTE — ED Notes (Signed)
Bed alarm on and in place

## 2019-07-15 NOTE — ED Provider Notes (Signed)
[facility] Emergency Department Provider Note ____________________________________________   First MD Initiated Contact with Patient 07/15/19 2034     (approximate)  I have reviewed the triage vital signs and the nursing notes.   HISTORY  Chief Complaint Fall  Level 5 caveat: History of present illness limited due to altered mental status  HPI Holly Espinoza is a 59 y.o. female with PMH as noted below who presents after an unwitnessed fall.  Per EMS, the patient may have taken more than her normal amount of pain medication although the source of this history is unclear.  The patient apparently hit her head when she fell.  At this time, the patient reports pain behind both of her knees but denies other symptoms, although she is not able to answer most of my questions.  Past Medical History:  Diagnosis Date  . Anxiety   . Arthritis    "pretty much from my neck to my feet" (07/08/2012)  . B12 deficiency anemia   . Chronic lower back pain   . Depression   . Diabetic peripheral neuropathy ([place])   . Gout   . High cholesterol   . IDA (iron deficiency anemia) 06/17/2019  . 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 Neuromuscular disorder ([place])   . Rheumatoid arthritis ([place])   . Type II diabetes mellitus (HCC)    recent hgb A1C was 5.4 per Dr. Humphrey Rolls, [place], [place]    Patient Active Problem List   Diagnosis Date Noted  . Vitamin D deficiency 06/20/2019  . Encounter for screening mammogram for malignant neoplasm of breast 06/20/2019  . Breast pain, right 06/20/2019  . IDA (iron deficiency anemia) 06/17/2019  . Other fatigue 05/10/2019  . Fall at home, subsequent encounter 04/24/2019  . Lumbosacral radiculopathy due to intervertebral disc disorder 04/24/2019  . Diastolic dysfunction 65/53/7482  . Acute upper respiratory infection 03/18/2019  . Acute otitis externa of both ears 03/18/2019  . Cough 03/18/2019  . Yeast infection 03/18/2019   . Pain and swelling of left lower leg 03/08/2019  . Pain in both lower extremities 02/19/2019  . Cellulitis and abscess of leg 02/19/2019  . Chronic pain of both shoulders 08/28/2018  . Shortness of breath 07/24/2018  . Varicose veins of leg with edema, bilateral 07/24/2018  . Chronic gout without tophus 04/09/2018  . Post traumatic stress disorder (PTSD) 04/09/2018  . Right flank pain 04/09/2018  . Fracture of radial head, closed 02/26/2018  . Olecranon bursitis 02/26/2018  . Non-recurrent acute suppurative otitis media of right ear without spontaneous rupture of tympanic membrane 02/26/2018  . Encounter for general adult medical examination with abnormal findings 12/02/2017  . Bacterial vaginitis 12/02/2017  . Screening for malignant neoplasm of cervix 12/02/2017  . Edema 09/15/2017  . Uncontrolled type 2 diabetes mellitus with hyperglycemia ([place]) 09/15/2017  . Urinary tract infection with hematuria 07/09/2017  . Bladder prolapse, female, acquired 07/09/2017  . Vaginal candidiasis 07/09/2017  . Generalized anxiety disorder with panic attacks 07/09/2017  . Dysuria 07/09/2017  . Acute left ankle pain 06/08/2017  . Gastroesophageal reflux disease without esophagitis 06/08/2017  . Moderate episode of recurrent major depressive disorder ([place]) 06/08/2017  . Acute non-recurrent pansinusitis 04/16/2017  . Flu-like symptoms 04/16/2017  . Cervicalgia 04/16/2017  . Impingement syndrome of shoulder region 05/08/2016  . Rectocele 11/01/2015  . Arthritis 08/02/2015  . Type 2 diabetes mellitus with hyperglycemia, without long-term current use of insulin ([place]) 08/02/2015  . Neuralgia neuritis, sciatic  nerve 08/02/2015  . Vitamin B12 deficiency 08/02/2015  . Lobular carcinoma in situ of breast 02/16/2013  . Post menopausal syndrome 09/24/2012  . Postmenopausal 09/24/2012  . Absolute anemia 09/18/2012  . Leg edema, left 09/03/2012  . Protein-calorie malnutrition, severe ([place]) 08/15/2012  .  Back ache 01/14/2012  . Diabetes mellitus without complication ([place]) 65/46/5035  . HLD (hyperlipidemia) 01/14/2012  . BP (high blood pressure) 01/14/2012  . Flu vaccine need 01/14/2012  . Diabetic peripheral neuropathy associated with type 2 diabetes mellitus ([address]) 10/12/2011  . Abnormal kidney function study 06/28/2011  . Benign neoplasm of soft tissues 05/31/2011  . Skin mole 05/31/2011    Past Surgical History:  Procedure Laterality Date  . ABDOMINAL HYSTERECTOMY  2001?  . ANTERIOR AND POSTERIOR REPAIR N/A 11/01/2015   Procedure: CYSTOSCOPY, ANTERIOR (CYSTOCELE) AND POSTERIOR REPAIR (RECTOCELE);  Surgeon: Bjorn Loser, MD;  Location: [facility]S;  Service: Urology;  Laterality: N/A;  . ANTERIOR CERVICAL DECOMP/DISCECTOMY FUSION  2012  . BACK SURGERY  2001   laminectomy  . BREAST BIOPSY Left 2014  . BREAST LUMPECTOMY Left 02/25/2013  . BREAST SURGERY Left 02/25/13   lumpectomy  . CHOLECYSTECTOMY  1980's  . DILATION AND CURETTAGE OF UTERUS  [phone]   "probably 3" (07/08/2012)  . FRACTURE SURGERY     C4-7 Surgery for fracture  . LUMBAR WOUND DEBRIDEMENT N/A 08/12/2012   Procedure: LUMBAR WOUND DEBRIDEMENT;  Surgeon: Faythe Ghee, MD;  Location: [facility];  Service: Neurosurgery;  Laterality: N/A;  Lumbar Wound Debridement  . POSTERIOR LUMBAR FUSION  07/08/2012  . TONSILLECTOMY  ~ 1968    Prior to Admission medications   Medication Sig Start Date End Date Taking? Authorizing Provider  allopurinol (ZYLOPRIM) 100 MG tablet Take 2 tab po at bedtime 04/17/19   Ronnell Freshwater, NP  AMITIZA 24 MCG capsule Take 24 mcg by mouth 2 (two) times daily. 02/12/19   [provider]  aspirin 81 MG chewable tablet Chew 81 mg by mouth daily.    [provider]  Colchicine (MITIGARE) 0.6 MG CAPS Take 1.2 mg by mouth See admin instructions. At onset of gout flare. May repeat one additional capsule 1 hour later if symptoms persist. 06/13/17   [provider]    Cyanocobalamin (B-12) 1000 MCG/ML KIT Inject 1,000 mcg as directed every 30 (thirty) days. 04/08/18   Ronnell Freshwater, NP  diazepam (VALIUM) 5 MG tablet Take 1/2 to 1 tablet po BID PRN only. 06/05/19   Ronnell Freshwater, NP  DULoxetine (CYMBALTA) 60 MG capsule Take 1 capsule (60 mg total) by mouth 2 (two) times daily. 06/05/19   Ronnell Freshwater, NP  ergocalciferol (DRISDOL) 1.25 MG (50000 UT) capsule Take 1 capsule (50,000 Units total) by mouth once a week. 06/05/19   Ronnell Freshwater, NP  ferrous sulfate (FERROUSUL) 325 (65 FE) MG tablet Take 1 tablet (325 mg total) by mouth 2 (two) times daily with a meal. 06/10/19   Earlie Server, MD  fexofenadine (ALLEGRA) 180 MG tablet Take 180 mg by mouth daily.    [provider]  fluconazole (DIFLUCAN) 150 MG tablet Take 1 tablet (150 mg total) by mouth daily. Take one tablet once and may repeat if symptoms persist in 3 days Patient not taking: Reported on 06/09/2019 03/12/19   Ronnell Freshwater, NP  furosemide (LASIX) 20 MG tablet Take 1 tablet (20 mg total) by mouth daily as needed for fluid or edema. Patient not taking: Reported on 06/09/2019 10/02/18  Ronnell Freshwater, NP  gemfibrozil (LOPID) 600 MG tablet Take 1 tablet (600 mg total) by mouth 2 (two) times daily before a meal. 02/26/19   Boscia, Heather E, NP  hydrochlorothiazide (HYDRODIURIL) 25 MG tablet Take 1 tablet (25 mg total) by mouth daily. 02/26/19   Ronnell Freshwater, NP  Insulin Pen Needle (PEN NEEDLES 5/16") 30G X 8 MM MISC To use every day with victoza injections - E11.65 Patient not taking: Reported on 07/07/2019 04/08/18   Ronnell Freshwater, NP  levothyroxine (SYNTHROID) 75 MCG tablet Take 1 tablet (75 mcg total) by mouth daily before breakfast. 04/01/19   Ronnell Freshwater, NP  liraglutide (VICTOZA) 18 MG/3ML SOPN Inject  1.8 mg [place]  daily 06/03/19   Ronnell Freshwater, NP  meloxicam (MOBIC) 7.5 MG tablet Take 1 tablet (7.5 mg total) by mouth 2 (two) times daily as needed for pain.  04/06/19   Ronnell Freshwater, NP  mirtazapine (REMERON) 15 MG tablet Take 1 tablet (15 mg total) by mouth at bedtime. Patient not taking: Reported on 06/09/2019 06/09/18   Ronnell Freshwater, NP  mupirocin ointment (BACTROBAN) 2 % Place 1 application into the nose 2 (two) times daily. Patient not taking: Reported on 06/09/2019 06/17/18   Kendell Bane, NP  nystatin ointment (MYCOSTATIN) Apply 1 application topically 3 (three) times daily. Patient not taking: Reported on 06/09/2019 02/26/18   Ronnell Freshwater, NP  omeprazole (PRILOSEC) 40 MG capsule Take 1 capsule (40 mg total) by mouth daily. 05/20/19   Ronnell Freshwater, NP  ondansetron (ZOFRAN) 4 MG tablet Take 1 tablet (4 mg total) by mouth every 8 (eight) hours as needed for nausea. For nausea 05/20/19   Ronnell Freshwater, NP  oxyCODONE-acetaminophen (PERCOCET) 10-325 MG tablet Take 1 tablet by mouth every 8 (eight) hours. 04/10/19   [provider]  OXYCONTIN 10 MG 12 hr tablet SMARTSIG:1 Tablet(s) By Mouth Every 12 Hours 04/10/19   [provider]  pregabalin (LYRICA) 50 MG capsule Take 50 mg by mouth 2 (two) times daily.    [provider]  Syringe/Needle, Disp, (SYRINGE 3CC/20GX1") 20G X 1" 3 ML MISC To sue for B12 injections once monthly Patient not taking: Reported on 07/07/2019 06/03/19   Ronnell Freshwater, NP  topiramate (TOPAMAX) 100 MG tablet Take 100 mg by mouth 2 (two) times daily.    [provider]    Allergies Patient has no known allergies.  Family History  Problem Relation Age of Onset  . Kidney disease Father   . Heart disease Father   . Diabetes Father   . Hypertension Mother   . Depression Mother   . Cancer Other        breast great grandmother  . Breast cancer Other     Social History Social History   Tobacco Use  . Smoking status: Current Every Day Smoker    Packs/day: 0.50    Years: 40.00    Pack years: 20.00    Types: Cigarettes  . Smokeless tobacco: Never Used  Substance  Use Topics  . Alcohol use: No  . Drug use: No    Types: Marijuana, Cocaine    Comment: 07/08/2012 "last drug use >8 yr ago" (07/08/2012)    Review of Systems Level 5 caveat: Unable to 10 review of systems due to altered mental status    ____________________________________________   PHYSICAL EXAM:  VITAL SIGNS: ED Triage Vitals [07/15/19 1838]  Enc Vitals Group  BP 136/79     Pulse Rate 71     Resp 17     Temp 97.8 F (36.6 C)     Temp src      SpO2 100 %     Weight 208 lb 12.8 oz (94.7 kg)     Height '5\' 4"'$  (1.626 m)     Head Circumference      Peak Flow      Pain Score      Pain Loc      Pain Edu?      Excl. in [place]?     Constitutional: Somnolent but arousable.  Able to follow some commands, but difficulty answering most questions or giving history. Eyes: Conjunctivae are normal.  EOMI.  PERRLA. Head: Atraumatic. Nose: No congestion/rhinnorhea. Mouth/Throat: Mucous membranes are slightly dry.   Neck: Normal range of motion.  No midline cervical spinal tenderness. Cardiovascular: Normal rate, regular rhythm. Grossly normal heart sounds.  Good peripheral circulation. Respiratory: Normal respiratory effort.  No retractions. Lungs CTAB. Gastrointestinal: Soft and nontender. No distention.  Genitourinary: No flank tenderness. Musculoskeletal: No lower extremity edema.  No midline spinal tenderness.  No knee tenderness or deformity.  Patient resistant to bending her knees past about 40 degrees, but she is able to move them in this range. Neurologic: Motor intact all extremities. Skin:  Skin is warm and dry. No rash noted. Psychiatric: Unable to assess due to altered mental status.  ____________________________________________   LABS (all labs ordered are listed, but only abnormal results are displayed)  Labs Reviewed  BASIC METABOLIC PANEL - Abnormal; Notable for the following components:      Result Value   Glucose, Bld 105 (*)    BUN 25 (*)    Creatinine, Ser  1.27 (*)    GFR calc non Af Amer 46 (*)    GFR calc Af Amer 53 (*)    All other components within normal limits  CBC WITH DIFFERENTIAL/PLATELET  ETHANOL  URINALYSIS, COMPLETE (UACMP) WITH MICROSCOPIC  URINE DRUG SCREEN, QUALITATIVE ([facility] ONLY)   ____________________________________________  EKG  ED ECG REPORT I, Arta Silence, the attending physician, personally viewed and interpreted this ECG.  Date: 07/15/2019 EKG Time: 1845 Rate: 72 Rhythm: normal sinus rhythm QRS Axis: normal Intervals: normal ST/T Wave abnormalities: normal Narrative Interpretation: no evidence of acute ischemia  ____________________________________________  RADIOLOGY  CT head: No ICH or other acute abnormality CT cervical spine: No acute fracture  ____________________________________________   PROCEDURES  Procedure(s) performed: No  Procedures  Critical Care performed: No ____________________________________________   INITIAL IMPRESSION / ASSESSMENT AND PLAN / ED COURSE  Pertinent labs & imaging results that were available during my care of the patient were reviewed by me and considered in my medical decision making (see chart for details).  59 year old female with PMH as noted above including chronic pain on multiple medications presents after an apparent unwitnessed fall.  The patient herself is quite somnolent and is unable to give much history.  EMS reported that the patient may have taken too much of her pain medications although the source of this information is unclear.  I reviewed the past medical records in [place].  The patient was most recently seen in the ED in February after a mechanical fall.  On exam currently the patient is somnolent but arousable to loud voice.  Her vital signs are normal.  She is able to move all extremities.  There is no midline spinal tenderness or evidence of external trauma.  She reports pain behind both knees although she is able to range  them.  Overall presentation is most consistent with intoxication/oversedation related to opiates or other medication.  Prior records show that the patient is on oxycodone has been on OxyContin and meloxicam.  Differential also includes illicit drug use.  CT head and cervical spine were obtained from triage and both were negative for acute traumatic findings.  Initial lab work-up is unremarkable.  We will plan to observe until the patient's mental status clears.  ----------------------------------------- 11:04 PM on 07/15/2019 -----------------------------------------  Patient remains somnolent.  I have signed her out to the oncoming physician Dr. Owens Shark.  The plan will be to observe for her to return to baseline mental status. ____________________________________________   FINAL CLINICAL IMPRESSION(S) / ED DIAGNOSES  Final diagnoses:  Altered mental status, unspecified altered mental status type      NEW MEDICATIONS STARTED DURING THIS VISIT:  New Prescriptions   No medications on file     Note:  This document was prepared using Dragon voice recognition software and may include unintentional dictation errors.    Arta Silence, MD 07/15/19 [phone]

## 2019-07-15 NOTE — ED Triage Notes (Signed)
FIRST NURSE: Unwitnessed fall, hit head, may have taken extra pain meds today.  C-collar.

## 2019-07-15 NOTE — ED Triage Notes (Signed)
Reference first nurse note. Pt presents via [facility] with c/o unwitnessed fall. Pt lethargic at this time. C-collar in place. EMS reports pt may have taken more than prescribed of pain medication. Pt endorses hitting head with fall. Pt denies blood thinner use. Pt poor historian at this time.

## 2019-07-16 DIAGNOSIS — G9340 Encephalopathy, unspecified: Secondary | ICD-10-CM | POA: Diagnosis present

## 2019-07-16 LAB — URINALYSIS, COMPLETE (UACMP) WITH MICROSCOPIC
Bilirubin Urine: NEGATIVE
Glucose, UA: NEGATIVE mg/dL
Ketones, ur: NEGATIVE mg/dL
Nitrite: NEGATIVE
Protein, ur: NEGATIVE mg/dL
Specific Gravity, Urine: 1.011 (ref 1.005–1.030)
pH: 5 (ref 5.0–8.0)

## 2019-07-16 LAB — URINE DRUG SCREEN, QUALITATIVE (ARMC ONLY)
Amphetamines, Ur Screen: POSITIVE — AB
Barbiturates, Ur Screen: NOT DETECTED
Benzodiazepine, Ur Scrn: POSITIVE — AB
Cannabinoid 50 Ng, Ur ~~LOC~~: NOT DETECTED
Cocaine Metabolite,Ur ~~LOC~~: NOT DETECTED
MDMA (Ecstasy)Ur Screen: NOT DETECTED
Methadone Scn, Ur: NOT DETECTED
Opiate, Ur Screen: POSITIVE — AB
Phencyclidine (PCP) Ur S: NOT DETECTED
Tricyclic, Ur Screen: NOT DETECTED

## 2019-07-16 NOTE — ED Provider Notes (Signed)
Patient.  Patient with Dr. Cherylann Banas 11:00 PM.  Patient is now awake alert oriented x4 without any complaints.  Patient states that she took her "OxyContin and 2 Percocets yesterday" as prescribed.  I reviewed all the labs and imaging that was performed by Dr. Cherylann Banas.  Patient will be discharged home.   Gregor Hams, MD 07/16/19 [phone]

## 2019-07-16 NOTE — ED Notes (Signed)
Gave pt phone to call ride

## 2019-07-16 NOTE — ED Notes (Signed)
Assisted pt to restroom, pt very unsteady on feet

## 2019-07-22 ENCOUNTER — Other Ambulatory Visit: Payer: Self-pay

## 2019-07-22 DIAGNOSIS — E039 Hypothyroidism, unspecified: Secondary | ICD-10-CM

## 2019-07-22 MED ORDER — LEVOTHYROXINE SODIUM 75 MCG PO TABS: 75.0000 ug | ORAL_TABLET | Freq: Every day | ORAL | 3 refills | Status: DC

## 2019-07-27 ENCOUNTER — Encounter: Payer: Self-pay | Admitting: Urology

## 2019-07-27 ENCOUNTER — Ambulatory Visit: Payer: Self-pay | Admitting: Urology

## 2019-08-12 ENCOUNTER — Other Ambulatory Visit: Payer: Self-pay

## 2019-08-12 DIAGNOSIS — M25511 Pain in right shoulder: Secondary | ICD-10-CM

## 2019-08-12 DIAGNOSIS — M542 Cervicalgia: Secondary | ICD-10-CM

## 2019-08-12 DIAGNOSIS — G8929 Other chronic pain: Secondary | ICD-10-CM

## 2019-08-12 MED ORDER — MELOXICAM 7.5 MG PO TABS: 7.5000 mg | ORAL_TABLET | Freq: Two times a day (BID) | ORAL | 3 refills | Status: DC | PRN

## 2019-08-26 ENCOUNTER — Other Ambulatory Visit: Payer: Self-pay

## 2019-08-26 DIAGNOSIS — M1A9XX0 Chronic gout, unspecified, without tophus (tophi): Secondary | ICD-10-CM

## 2019-08-26 MED ORDER — ALLOPURINOL 100 MG PO TABS: ORAL_TABLET | ORAL | 3 refills | Status: DC

## 2019-08-26 MED ORDER — ALLOPURINOL 100 MG PO TABS
ORAL_TABLET | ORAL | 3 refills | Status: DC
Start: 2019-08-26 — End: 2019-11-11

## 2019-09-03 ENCOUNTER — Other Ambulatory Visit: Payer: Self-pay

## 2019-09-03 ENCOUNTER — Telehealth: Payer: Self-pay

## 2019-09-03 DIAGNOSIS — B379 Candidiasis, unspecified: Secondary | ICD-10-CM

## 2019-09-03 MED ORDER — FLUCONAZOLE 150 MG PO TABS: 150.0000 mg | ORAL_TABLET | Freq: Every day | ORAL | 0 refills | Status: DC

## 2019-09-03 NOTE — Telephone Encounter (Signed)
Pt called that she had yeast infection need refills for diflucan as per heather send advised pt to keep her appt

## 2019-09-03 NOTE — Telephone Encounter (Signed)
Lmom to confirm and screen for  09-07-19 ov.

## 2019-09-07 ENCOUNTER — Ambulatory Visit (INDEPENDENT_AMBULATORY_CARE_PROVIDER_SITE_OTHER): Payer: Medicaid Other | Admitting: Nurse Practitioner

## 2019-09-07 ENCOUNTER — Other Ambulatory Visit: Payer: Self-pay

## 2019-09-07 VITALS — Resp 16 | Ht 69.0 in | Wt 199.0 lb

## 2019-09-07 DIAGNOSIS — M7989 Other specified soft tissue disorders: Secondary | ICD-10-CM

## 2019-09-07 DIAGNOSIS — F411 Generalized anxiety disorder: Secondary | ICD-10-CM

## 2019-09-07 DIAGNOSIS — M79662 Pain in left lower leg: Secondary | ICD-10-CM

## 2019-09-07 DIAGNOSIS — E785 Hyperlipidemia, unspecified: Secondary | ICD-10-CM | POA: Diagnosis not present

## 2019-09-07 DIAGNOSIS — F410 Panic disorder [episodic paroxysmal anxiety] without agoraphobia: Secondary | ICD-10-CM

## 2019-09-07 DIAGNOSIS — M542 Cervicalgia: Secondary | ICD-10-CM | POA: Diagnosis not present

## 2019-09-07 DIAGNOSIS — E1165 Type 2 diabetes mellitus with hyperglycemia: Secondary | ICD-10-CM

## 2019-09-07 DIAGNOSIS — K219 Gastro-esophageal reflux disease without esophagitis: Secondary | ICD-10-CM

## 2019-09-07 MED ORDER — OMEPRAZOLE 40 MG PO CPDR: 40.0000 mg | DELAYED_RELEASE_CAPSULE | Freq: Every day | ORAL | 3 refills | Status: DC

## 2019-09-07 MED ORDER — DIAZEPAM 5 MG PO TABS: ORAL_TABLET | ORAL | 2 refills | Status: DC

## 2019-09-07 MED ORDER — GEMFIBROZIL 600 MG PO TABS: 600.0000 mg | ORAL_TABLET | Freq: Two times a day (BID) | ORAL | 3 refills | Status: DC

## 2019-09-07 MED ORDER — HYDROCHLOROTHIAZIDE 25 MG PO TABS: 25.0000 mg | ORAL_TABLET | Freq: Every day | ORAL | 3 refills | Status: DC

## 2019-09-07 MED ORDER — DULOXETINE HCL 60 MG PO CPEP: 60.0000 mg | ORAL_CAPSULE | Freq: Two times a day (BID) | ORAL | 3 refills | Status: DC

## 2019-09-07 NOTE — Progress Notes (Signed)
[facility] [place], [place] [address]  Internal MEDICINE  Telephone Visit  Patient Name: Holly Espinoza  [id number]  [id number]  Date of Service: 09/14/2019  I connected with the patient at 5:12pm by telephone and verified the patients identity using two identifiers.   I discussed the limitations, risks, security and privacy concerns of performing an evaluation and management service by telephone and the availability of in person appointments. I also discussed with the patient that there may be a patient responsible charge related to the service.  The patient expressed understanding and agrees to proceed.    Chief Complaint  Patient presents with  . Telephone Assessment  . Telephone Screen  . Diabetes  . Hyperlipidemia  . Depression  . Quality Metric Gaps    Covid vaccine     The patient has been contacted via telephone for follow up visit due to concerns for spread of novel coronavirus. The patient states that she has good control of blood sugars. Blood pressure also well managed. She continues to have moderate fatigue. Seeing hematology for continued evaluation and management. She does need to have refill of current medication.       Current Medication: Outpatient Encounter Medications as of 09/07/2019  Medication Sig  . allopurinol (ZYLOPRIM) 100 MG tablet Take 2 tab po at bedtime  . AMITIZA 24 MCG capsule Take 24 mcg by mouth 2 (two) times daily.  Marland Kitchen aspirin 81 MG chewable tablet Chew 81 mg by mouth daily.  . Colchicine (MITIGARE) 0.6 MG CAPS Take 1.2 mg by mouth See admin instructions. At onset of gout flare. May repeat one additional capsule 1 hour later if symptoms persist.  . Cyanocobalamin (B-12) 1000 MCG/ML KIT Inject 1,000 mcg as directed every 30 (thirty) days.  . diazepam (VALIUM) 5 MG tablet Take 1/2 to 1 tablet po BID PRN only.  . DULoxetine (CYMBALTA) 60 MG capsule Take 1 capsule (60 mg total) by mouth 2 (two) times daily.  .  ergocalciferol (DRISDOL) 1.25 MG (50000 UT) capsule Take 1 capsule (50,000 Units total) by mouth once a week.  . ferrous sulfate (FERROUSUL) 325 (65 FE) MG tablet Take 1 tablet (325 mg total) by mouth 2 (two) times daily with a meal.  . fexofenadine (ALLEGRA) 180 MG tablet Take 180 mg by mouth daily.  . fluconazole (DIFLUCAN) 150 MG tablet Take 1 tablet (150 mg total) by mouth daily. Take one tablet once and may repeat if symptoms persist in 3 days  . furosemide (LASIX) 20 MG tablet Take 1 tablet (20 mg total) by mouth daily as needed for fluid or edema. (Patient not taking: Reported on 06/09/2019)  . gemfibrozil (LOPID) 600 MG tablet Take 1 tablet (600 mg total) by mouth 2 (two) times daily before a meal.  . hydrochlorothiazide (HYDRODIURIL) 25 MG tablet Take 1 tablet (25 mg total) by mouth daily.  . Insulin Pen Needle (PEN NEEDLES 5/16") 30G X 8 MM MISC To use every day with victoza injections - E11.65 (Patient not taking: Reported on 07/07/2019)  . levothyroxine (SYNTHROID) 75 MCG tablet Take 1 tablet (75 mcg total) by mouth daily before breakfast.  . liraglutide (VICTOZA) 18 MG/3ML SOPN Inject  1.8 mg [place]  daily  . meloxicam (MOBIC) 7.5 MG tablet Take 1 tablet (7.5 mg total) by mouth 2 (two) times daily as needed for pain.  Marland Kitchen omeprazole (PRILOSEC) 40 MG capsule Take 1 capsule (40 mg total) by mouth daily.  . ondansetron (ZOFRAN) 4 MG tablet  Take 1 tablet (4 mg total) by mouth every 8 (eight) hours as needed for nausea. For nausea  . oxyCODONE-acetaminophen (PERCOCET) 10-325 MG tablet Take 1 tablet by mouth every 8 (eight) hours.  . OXYCONTIN 10 MG 12 hr tablet Take 10 mg by mouth every 12 (twelve) hours.   . pregabalin (LYRICA) 50 MG capsule Take 50 mg by mouth 2 (two) times daily.  Marland Kitchen topiramate (TOPAMAX) 100 MG tablet Take 100 mg by mouth 2 (two) times daily.  . [DISCONTINUED] diazepam (VALIUM) 5 MG tablet Take 1/2 to 1 tablet po BID PRN only.  . [DISCONTINUED] DULoxetine (CYMBALTA) 60 MG capsule  Take 1 capsule (60 mg total) by mouth 2 (two) times daily.  . [DISCONTINUED] gemfibrozil (LOPID) 600 MG tablet Take 1 tablet (600 mg total) by mouth 2 (two) times daily before a meal.  . [DISCONTINUED] hydrochlorothiazide (HYDRODIURIL) 25 MG tablet Take 1 tablet (25 mg total) by mouth daily.  . [DISCONTINUED] mirtazapine (REMERON) 15 MG tablet Take 1 tablet (15 mg total) by mouth at bedtime. (Patient not taking: Reported on 06/09/2019)  . [DISCONTINUED] mupirocin ointment (BACTROBAN) 2 % Place 1 application into the nose 2 (two) times daily. (Patient not taking: Reported on 06/09/2019)  . [DISCONTINUED] nystatin ointment (MYCOSTATIN) Apply 1 application topically 3 (three) times daily. (Patient not taking: Reported on 06/09/2019)  . [DISCONTINUED] omeprazole (PRILOSEC) 40 MG capsule Take 1 capsule (40 mg total) by mouth daily.  . [DISCONTINUED] Syringe/Needle, Disp, (SYRINGE 3CC/20GX1") 20G X 1" 3 ML MISC To sue for B12 injections once monthly (Patient not taking: Reported on 07/07/2019)   No facility-administered encounter medications on file as of 09/07/2019.    Surgical History: Past Surgical History:  Procedure Laterality Date  . ABDOMINAL HYSTERECTOMY  2001?  . ANTERIOR AND POSTERIOR REPAIR N/A 11/01/2015   Procedure: CYSTOSCOPY, ANTERIOR (CYSTOCELE) AND POSTERIOR REPAIR (RECTOCELE);  Surgeon: Bjorn Loser, MD;  Location: [facility];  Service: Urology;  Laterality: N/A;  . ANTERIOR CERVICAL DECOMP/DISCECTOMY FUSION  2012  . BACK SURGERY  2001   laminectomy  . BREAST BIOPSY Left 2014  . BREAST LUMPECTOMY Left 02/25/2013  . BREAST SURGERY Left 02/25/13   lumpectomy  . CHOLECYSTECTOMY  1980's  . DILATION AND CURETTAGE OF UTERUS  [id number]   "probably 3" (07/08/2012)  . FRACTURE SURGERY     C4-7 Surgery for fracture  . LUMBAR WOUND DEBRIDEMENT N/A 08/12/2012   Procedure: LUMBAR WOUND DEBRIDEMENT;  Surgeon: Faythe Ghee, MD;  Location: [facility];  Service: Neurosurgery;  Laterality:  N/A;  Lumbar Wound Debridement  . POSTERIOR LUMBAR FUSION  07/08/2012  . TONSILLECTOMY  ~ 1968    Medical History: Past Medical History:  Diagnosis Date  . Anxiety   . Arthritis    "pretty much from my neck to my feet" (07/08/2012)  . B12 deficiency anemia   . Chronic lower back pain   . Depression   . Diabetic peripheral neuropathy ([place])   . Gout   . High cholesterol   . IDA (iron deficiency anemia) 06/17/2019  . 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 Neuromuscular disorder ([place])   . Rheumatoid arthritis ([place])   . Type II diabetes mellitus (HCC)    recent hgb A1C was 5.4 per Dr. Humphrey Rolls, [place], [place]    Family History: Family History  Problem Relation Age of Onset  . Kidney disease Father   . Heart disease Father   . Diabetes Father   . Hypertension Mother   .  Depression Mother   . Cancer Other        breast great grandmother  . Breast cancer Other     Social History   Socioeconomic History  . Marital status: Single    Spouse name: Not on file  . Number of children: Not on file  . Years of education: Not on file  . Highest education level: Not on file  Occupational History  . Occupation: disability  Tobacco Use  . Smoking status: Current Every Day Smoker    Packs/day: 0.50    Years: 40.00    Pack years: 20.00    Types: Cigarettes  . Smokeless tobacco: Never Used  Vaping Use  . Vaping Use: Never used  Substance and Sexual Activity  . Alcohol use: No  . Drug use: No    Types: Marijuana, Cocaine    Comment: 07/08/2012 "last drug use >8 yr ago" (07/08/2012)  . Sexual activity: Yes    Comment: quit a long time ago  Other Topics Concern  . Not on file  Social History Narrative  . Not on file   Social Determinants of Health   Financial Resource Strain:   . Difficulty of Paying Living Expenses:   Food Insecurity:   . Worried About Charity fundraiser in the Last Year:   . Arboriculturist in the Last Year:   Transportation Needs:    . Film/video editor (Medical):   Marland Kitchen Lack of Transportation (Non-Medical):   Physical Activity:   . Days of Exercise per Week:   . Minutes of Exercise per Session:   Stress:   . Feeling of Stress :   Social Connections:   . Frequency of Communication with Friends and Family:   . Frequency of Social Gatherings with Friends and Family:   . Attends Religious Services:   . Active Member of Clubs or Organizations:   . Attends Archivist Meetings:   Marland Kitchen Marital Status:   Intimate Partner Violence:   . Fear of Current or Ex-Partner:   . Emotionally Abused:   Marland Kitchen Physically Abused:   . Sexually Abused:       Review of Systems  Constitutional: Positive for fatigue. Negative for activity change, chills and unexpected weight change.  HENT: Negative for congestion, postnasal drip, rhinorrhea, sneezing and sore throat.   Respiratory: Negative for cough, chest tightness, shortness of breath and wheezing.   Cardiovascular: Negative for chest pain and palpitations.  Gastrointestinal: Negative for abdominal pain, constipation, diarrhea, nausea and vomiting.  Endocrine: Negative for cold intolerance, heat intolerance, polydipsia and polyuria.       Blood sugars doing well   Musculoskeletal: Positive for arthralgias, back pain, gait problem, joint swelling and myalgias. Negative for neck pain.       The patient is seeing neurosurgery and pain management through neurosurgery.   Skin: Negative for rash.  Neurological: Positive for dizziness. Negative for tremors, numbness and headaches.  Hematological: Negative for adenopathy. Does not bruise/bleed easily.  Psychiatric/Behavioral: Positive for dysphoric mood. Negative for behavioral problems (Depression), sleep disturbance and suicidal ideas. The patient is nervous/anxious.     Today's Vitals   09/07/19 1515  Resp: 16  Weight: 199 lb (90.3 kg)  Height: 5' 9"  (1.753 m)   Body mass index is 29.39  kg/m.  Observation/Objective:   The patient is alert and oriented. She is pleasant and answers all questions appropriately. Breathing is non-labored. She is in no acute distress at this time.  Assessment/Plan: 1. Type 2 diabetes mellitus with hyperglycemia, without long-term current use of insulin ([place]) Patient reports good control of blood sugar. Continue medication as prescribed. Refer to opthalmology for diabetic eye exam . - Ambulatory referral to Ophthalmology  2. Hyperlipidemia, unspecified hyperlipidemia type Continue gemfibrozil as prescribed  - gemfibrozil (LOPID) 600 MG tablet; Take 1 tablet (600 mg total) by mouth 2 (two) times daily before a meal.  Dispense: 60 tablet; Refill: 3  3. Gastroesophageal reflux disease without esophagitis - omeprazole (PRILOSEC) 40 MG capsule; Take 1 capsule (40 mg total) by mouth daily.  Dispense: 30 capsule; Refill: 3  4. Cervicalgia May take diazepam 62m up to twice daily as needed for muscle pain and tightness. New prescription sent to her pharmacy today.  - diazepam (VALIUM) 5 MG tablet; Take 1/2 to 1 tablet po BID PRN only.  Dispense: 45 tablet; Refill: 2  5. Pain and swelling of left lower leg Continue HCTZ as prescribed.  - hydrochlorothiazide (HYDRODIURIL) 25 MG tablet; Take 1 tablet (25 mg total) by mouth daily.  Dispense: 30 tablet; Refill: 3  6. Generalized anxiety disorder with panic attacks Take duloxetine 629mtwice daily. May continue diazepam 85m485mp to twice daily as needed for acute anxiety. Refills for both were sent to her pharmacy.  - diazepam (VALIUM) 5 MG tablet; Take 1/2 to 1 tablet po BID PRN only.  Dispense: 45 tablet; Refill: 2 - DULoxetine (CYMBALTA) 60 MG capsule; Take 1 capsule (60 mg total) by mouth 2 (two) times daily.  Dispense: 60 capsule; Refill: 3  General Counseling: CarMarvierbalizes understanding of the findings of today's phone visit and agrees with plan of treatment. I have discussed any further  diagnostic evaluation that may be needed or ordered today. We also reviewed her medications today. she has been encouraged to call the office with any questions or concerns that should arise related to todays visit.  Reviewed risks and possible side effects associated with taking opiates, benzodiazepines and other CNS depressants. Combination of these could cause dizziness and drowsiness. Advised patient not to drive or operate machinery when taking these medications, as patient's and other's life can be at risk and will have consequences. Patient verbalized understanding in this matter. Dependence and abuse for these drugs will be monitored closely. A Controlled substance policy and procedure is on file which allows [facility] to order a urine drug screen test at any visit. Patient understands and agrees with the plan  This patient was seen by [facility]re[facility] Collaboration with Dr FozLavera Guise a part of collaborative care agreement  Orders Placed This Encounter  Procedures  . Ambulatory referral to Ophthalmology    Meds ordered this encounter  Medications  . diazepam (VALIUM) 5 MG tablet    Sig: Take 1/2 to 1 tablet po BID PRN only.    Dispense:  45 tablet    Refill:  2    Take 1 tab po bid prn for muscle pain and tightness.    Order Specific Question:   Supervising Provider    Answer:   KHALavera Guise4[[id number]] DULoxetine (CYMBALTA) 60 MG capsule    Sig: Take 1 capsule (60 mg total) by mouth 2 (two) times daily.    Dispense:  60 capsule    Refill:  3    Please note increased dosing frequency    Order Specific Question:   Supervising Provider    Answer:   KHALavera Guise4[[id number]]  gemfibrozil (LOPID) 600 MG tablet    Sig: Take 1 tablet (600 mg total) by mouth 2 (two) times daily before a meal.    Dispense:  60 tablet    Refill:  3    Order Specific Question:   Supervising Provider    Answer:   Lavera Guise [place]  . hydrochlorothiazide (HYDRODIURIL) 25 MG tablet     Sig: Take 1 tablet (25 mg total) by mouth daily.    Dispense:  30 tablet    Refill:  3    Order Specific Question:   Supervising Provider    Answer:   Lavera Guise [[id number]]  . omeprazole (PRILOSEC) 40 MG capsule    Sig: Take 1 capsule (40 mg total) by mouth daily.    Dispense:  30 capsule    Refill:  3    Order Specific Question:   Supervising Provider    Answer:   Lavera Guise [[id number]]    Time spent: 49 Minutes    Dr Lavera Guise Internal medicine

## 2019-09-14 ENCOUNTER — Encounter: Payer: Self-pay | Admitting: Nurse Practitioner

## 2019-10-30 ENCOUNTER — Other Ambulatory Visit: Payer: Self-pay

## 2019-10-30 DIAGNOSIS — E039 Hypothyroidism, unspecified: Secondary | ICD-10-CM

## 2019-10-30 MED ORDER — LEVOTHYROXINE SODIUM 75 MCG PO TABS: 75.0000 ug | ORAL_TABLET | Freq: Every day | ORAL | 3 refills | Status: DC

## 2019-11-06 ENCOUNTER — Inpatient Hospital Stay: Payer: Medicaid Other | Attending: Oncology

## 2019-11-06 ENCOUNTER — Other Ambulatory Visit: Payer: Self-pay

## 2019-11-06 DIAGNOSIS — D509 Iron deficiency anemia, unspecified: Secondary | ICD-10-CM | POA: Diagnosis not present

## 2019-11-06 DIAGNOSIS — F17210 Nicotine dependence, cigarettes, uncomplicated: Secondary | ICD-10-CM | POA: Diagnosis not present

## 2019-11-06 DIAGNOSIS — R5382 Chronic fatigue, unspecified: Secondary | ICD-10-CM | POA: Insufficient documentation

## 2019-11-06 DIAGNOSIS — E611 Iron deficiency: Secondary | ICD-10-CM

## 2019-11-06 LAB — CBC WITH DIFFERENTIAL/PLATELET
Abs Immature Granulocytes: 0.03 10*3/uL (ref 0.00–0.07)
Basophils Absolute: 0.1 10*3/uL (ref 0.0–0.1)
Basophils Relative: 1 %
Eosinophils Absolute: 0.4 10*3/uL (ref 0.0–0.5)
Eosinophils Relative: 6 %
HCT: 41.4 % (ref 36.0–46.0)
Hemoglobin: 13.9 g/dL (ref 12.0–15.0)
Immature Granulocytes: 1 %
Lymphocytes Relative: 35 %
Lymphs Abs: 2.2 10*3/uL (ref 0.7–4.0)
MCH: 28.4 pg (ref 26.0–34.0)
MCHC: 33.6 g/dL (ref 30.0–36.0)
MCV: 84.7 fL (ref 80.0–100.0)
Monocytes Absolute: 0.4 10*3/uL (ref 0.1–1.0)
Monocytes Relative: 6 %
Neutro Abs: 3.2 10*3/uL (ref 1.7–7.7)
Neutrophils Relative %: 51 %
Platelets: 243 10*3/uL (ref 150–400)
RBC: 4.89 MIL/uL (ref 3.87–5.11)
RDW: 13.5 % (ref 11.5–15.5)
WBC: 6.3 10*3/uL (ref 4.0–10.5)
nRBC: 0 % (ref 0.0–0.2)

## 2019-11-06 LAB — IRON AND TIBC
Iron: 40 ug/dL (ref 28–170)
Saturation Ratios: 9 % — ABNORMAL LOW (ref 10.4–31.8)
TIBC: 465 ug/dL — ABNORMAL HIGH (ref 250–450)
UIBC: 425 ug/dL

## 2019-11-06 LAB — FERRITIN: Ferritin: 18 ng/mL (ref 11–307)

## 2019-11-09 ENCOUNTER — Encounter: Payer: Self-pay | Admitting: Oncology

## 2019-11-09 ENCOUNTER — Other Ambulatory Visit: Payer: Self-pay

## 2019-11-09 ENCOUNTER — Inpatient Hospital Stay (HOSPITAL_BASED_OUTPATIENT_CLINIC_OR_DEPARTMENT_OTHER): Payer: Medicaid Other | Admitting: Oncology

## 2019-11-09 VITALS — BP 136/91 | HR 84 | Temp 97.2°F | Resp 16 | Wt 206.4 lb

## 2019-11-09 DIAGNOSIS — Z720 Tobacco use: Secondary | ICD-10-CM

## 2019-11-09 DIAGNOSIS — E611 Iron deficiency: Secondary | ICD-10-CM | POA: Diagnosis not present

## 2019-11-09 DIAGNOSIS — R5383 Other fatigue: Secondary | ICD-10-CM

## 2019-11-09 DIAGNOSIS — Z86000 Personal history of in-situ neoplasm of breast: Secondary | ICD-10-CM | POA: Diagnosis not present

## 2019-11-09 DIAGNOSIS — D509 Iron deficiency anemia, unspecified: Secondary | ICD-10-CM | POA: Diagnosis not present

## 2019-11-09 NOTE — Progress Notes (Signed)
Hematology/Oncology follow up note [facility] Telephone:([phone] [phone]   Patient Care Team: Ronnell Freshwater, NP as PCP - General (Family Medicine) Lavera Guise, MD (Internal Medicine) Christene Lye, MD (General Surgery)  REFERRING PROVIDER: Ronnell Freshwater, NP CHIEF COMPLAINTS/REASON FOR VISIT:  Follow-up for iron deficiency  HISTORY OF PRESENTING ILLNESS:  Holly Espinoza is a  59 y.o.  female with PMH listed below was seen in consultation at the request of Ronnell Freshwater, NP   for evaluation of iron deficiency anemia.   Reviewed patient's recent labs  06/03/2019 Labs revealed anemia with hemoglobin of 12.7, mcv 82, iron panel showed iron saturation of 9, ferritin 14, TIBC 447.   Associated signs and symptoms: Patient reports fatigue. denies  SOB with exertion.  Denies weight loss, easy bruising, hematochezia, hemoptysis, She noticed blood when she wipes after urination. She has history of bladder prolapse.  Context:   Rectal bleeding: denies.  Menstrual bleeding/ Vaginal bleeding : history of hystectomy.  Hematemesis or hemoptysis : denies  She has started taking oral iron supplementation ferrous sulfate 371m daily, iron makes her nausea and upset stomach.  She also has severe constipation and is on Amitiza.   History of LCIS in 2014,s/p lumpectomy.  She also has breast concerns, PCP has ordered diagnostic mammogram  Holly Espinoza a 59y.o. female who has above history reviewed by me today presents for follow up visit for iron deficiency Problems and complaints are listed below: Continues to feel fatigue and tired.  Chronic history of prolapsed bladder. She feels that she has blood in her urine. UA 06/05/2019 was negative for RBC.   Review of Systems  Constitutional: Positive for fatigue. Negative for appetite change, chills and fever.  HENT:   Negative for hearing loss and voice change.   Eyes:  Negative for eye problems.  Respiratory: Negative for chest tightness and cough.   Cardiovascular: Negative for chest pain.  Gastrointestinal: Positive for constipation. Negative for abdominal distention, abdominal pain and blood in stool.  Endocrine: Negative for hot flashes.  Genitourinary: Negative for difficulty urinating and frequency.        Blood after urination. History of bladder prolapse.   Musculoskeletal: Positive for back pain. Negative for arthralgias.  Skin: Negative for itching and rash.  Neurological: Negative for extremity weakness.  Hematological: Negative for adenopathy.  Psychiatric/Behavioral: Negative for confusion.    MEDICAL HISTORY:  Past Medical History:  Diagnosis Date  . Anxiety   . Arthritis    "pretty much from my neck to my feet" (07/08/2012)  . B12 deficiency anemia   . Chronic lower back pain   . Depression   . Diabetic peripheral neuropathy ([place]   . Gout   . High cholesterol   . IDA (iron deficiency anemia) 06/17/2019  . 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 Neuromuscular disorder ([place]   . Rheumatoid arthritis ([place]   . Type II diabetes mellitus (HCC)    recent hgb A1C was 5.4 per Dr. [place]phrey Rolls [place] Espinoza   SURGICAL HISTORY: Past Surgical History:  Procedure Laterality Date  . ABDOMINAL HYSTERECTOMY  2001?  . ANTERIOR AND POSTERIOR REPAIR N/A 11/01/2015   Procedure: CYSTOSCOPY, ANTERIOR (CYSTOCELE) AND POSTERIOR REPAIR (RECTOCELE);  Surgeon: SBjorn Loser MD;  Location: [facility];  Service: Urology;  Laterality: N/A;  . ANTERIOR CERVICAL DECOMP/DISCECTOMY FUSION  2012  . BACK SURGERY  2001   laminectomy  .  BREAST BIOPSY Left 2014  . BREAST LUMPECTOMY Left 02/25/2013  . BREAST SURGERY Left 02/25/13   lumpectomy  . CHOLECYSTECTOMY  1980's  . DILATION AND CURETTAGE OF UTERUS  [phone]   "probably 3" (07/08/2012)  . FRACTURE SURGERY     C4-7 Surgery for fracture  . LUMBAR WOUND DEBRIDEMENT N/A 08/12/2012    Procedure: LUMBAR WOUND DEBRIDEMENT;  Surgeon: Faythe Ghee, MD;  Location: [place] [facility];  Service: Neurosurgery;  Laterality: N/A;  Lumbar Wound Debridement  . POSTERIOR LUMBAR FUSION  07/08/2012  . TONSILLECTOMY  ~ 1968    SOCIAL HISTORY: Social History   Socioeconomic History  . Marital status: Single    Spouse name: Not on file  . Number of children: Not on file  . Years of education: Not on file  . Highest education level: Not on file  Occupational History  . Occupation: disability  Tobacco Use  . Smoking status: Current Every Day Smoker    Packs/day: 0.50    Years: 40.00    Pack years: 20.00    Types: Cigarettes  . Smokeless tobacco: Never Used  Vaping Use  . Vaping Use: Never used  Substance and Sexual Activity  . Alcohol use: No  . Drug use: No    Types: Marijuana, Cocaine    Comment: 07/08/2012 "last drug use >8 yr ago" (07/08/2012)  . Sexual activity: Yes    Comment: quit a long time ago  Other Topics Concern  . Not on file  Social History Narrative  . Not on file   Social Determinants of Health   Financial Resource Strain:   . Difficulty of Paying Living Expenses: Not on file  Food Insecurity:   . Worried About Charity fundraiser in the Last Year: Not on file  . Ran Out of Food in the Last Year: Not on file  Transportation Needs:   . Lack of Transportation (Medical): Not on file  . Lack of Transportation (Non-Medical): Not on file  Physical Activity:   . Days of Exercise per Week: Not on file  . Minutes of Exercise per Session: Not on file  Stress:   . Feeling of Stress : Not on file  Social Connections:   . Frequency of Communication with Friends and Family: Not on file  . Frequency of Social Gatherings with Friends and Family: Not on file  . Attends Religious Services: Not on file  . Active Member of Clubs or Organizations: Not on file  . Attends Archivist Meetings: Not on file  . Marital Status: Not on file  Intimate Partner  Violence:   . Fear of Current or Ex-Partner: Not on file  . Emotionally Abused: Not on file  . Physically Abused: Not on file  . Sexually Abused: Not on file    FAMILY HISTORY: Family History  Problem Relation Age of Onset  . Kidney disease Father   . Heart disease Father   . Diabetes Father   . Hypertension Mother   . Depression Mother   . Cancer Other        breast great grandmother  . Breast cancer Other     ALLERGIES:  has No Known Allergies.  MEDICATIONS:  Current Outpatient Medications  Medication Sig Dispense Refill  . allopurinol (ZYLOPRIM) 100 MG tablet Take 2 tab po at bedtime 180 tablet 3  . AMITIZA 24 MCG capsule Take 24 mcg by mouth 2 (two) times daily.    Marland Kitchen aspirin 81 MG chewable tablet Chew  81 mg by mouth daily.    . Colchicine (MITIGARE) 0.6 MG CAPS Take 1.2 mg by mouth See admin instructions. At onset of gout flare. May repeat one additional capsule 1 hour later if symptoms persist.    . Cyanocobalamin (B-12) 1000 MCG/ML KIT Inject 1,000 mcg as directed every 30 (thirty) days. 1 kit 3  . diazepam (VALIUM) 5 MG tablet Take 1/2 to 1 tablet po BID PRN only. 45 tablet 2  . DULoxetine (CYMBALTA) 60 MG capsule Take 1 capsule (60 mg total) by mouth 2 (two) times daily. 60 capsule 3  . ferrous sulfate (FERROUSUL) 325 (65 FE) MG tablet Take 1 tablet (325 mg total) by mouth 2 (two) times daily with a meal. 60 tablet 3  . gemfibrozil (LOPID) 600 MG tablet Take 1 tablet (600 mg total) by mouth 2 (two) times daily before a meal. 60 tablet 3  . hydrochlorothiazide (HYDRODIURIL) 25 MG tablet Take 1 tablet (25 mg total) by mouth daily. 30 tablet 3  . levothyroxine (SYNTHROID) 75 MCG tablet Take 1 tablet (75 mcg total) by mouth daily before breakfast. 30 tablet 3  . liraglutide (VICTOZA) 18 MG/3ML SOPN Inject  1.8 mg [place]  daily 5 pen 3  . meloxicam (MOBIC) 7.5 MG tablet Take 1 tablet (7.5 mg total) by mouth 2 (two) times daily as needed for pain. 60 tablet 3  . omeprazole  (PRILOSEC) 40 MG capsule Take 1 capsule (40 mg total) by mouth daily. 30 capsule 3  . ondansetron (ZOFRAN) 4 MG tablet Take 1 tablet (4 mg total) by mouth every 8 (eight) hours as needed for nausea. For nausea 20 tablet 0  . oxyCODONE-acetaminophen (PERCOCET) 10-325 MG tablet Take 1 tablet by mouth every 8 (eight) hours.    . OXYCONTIN 10 MG 12 hr tablet Take 10 mg by mouth every 12 (twelve) hours.     . pregabalin (LYRICA) 50 MG capsule Take 50 mg by mouth 2 (two) times daily.    Marland Kitchen topiramate (TOPAMAX) 100 MG tablet Take 100 mg by mouth 2 (two) times daily.    . ergocalciferol (DRISDOL) 1.25 MG (50000 UT) capsule Take 1 capsule (50,000 Units total) by mouth once a week. (Patient not taking: Reported on 11/09/2019) 4 capsule 5  . fexofenadine (ALLEGRA) 180 MG tablet Take 180 mg by mouth daily. (Patient not taking: Reported on 11/09/2019)    . fluconazole (DIFLUCAN) 150 MG tablet Take 1 tablet (150 mg total) by mouth daily. Take one tablet once and may repeat if symptoms persist in 3 days (Patient not taking: Reported on 11/09/2019) 3 tablet 0  . furosemide (LASIX) 20 MG tablet Take 1 tablet (20 mg total) by mouth daily as needed for fluid or edema. (Patient not taking: Reported on 06/09/2019) 30 tablet 3  . Insulin Pen Needle (PEN NEEDLES 5/16") 30G X 8 MM MISC To use every day with victoza injections - E11.65 (Patient not taking: Reported on 07/07/2019) 30 each 5   No current facility-administered medications for this visit.     PHYSICAL EXAMINATION: ECOG PERFORMANCE STATUS: 1 - Symptomatic but completely ambulatory Vitals:   11/09/19 1319  BP: (!) 136/91  Pulse: 84  Resp: 16  Temp: (!) 97.2 F (36.2 C)   Filed Weights   11/09/19 1319  Weight: 206 lb 6.4 oz (93.6 kg)    Physical Exam Constitutional:      General: She is not in acute distress. HENT:     Head: Normocephalic and atraumatic.  Eyes:  General: No scleral icterus. Cardiovascular:     Rate and Rhythm: Normal rate and  regular rhythm.     Heart sounds: Normal heart sounds.  Pulmonary:     Effort: Pulmonary effort is normal. No respiratory distress.     Breath sounds: No wheezing.  Abdominal:     General: Bowel sounds are normal. There is no distension.     Palpations: Abdomen is soft.  Musculoskeletal:        General: No deformity. Normal range of motion.     Cervical back: Normal range of motion and neck supple.  Skin:    General: Skin is warm and dry.     Findings: No erythema or rash.  Neurological:     Mental Status: She is alert and oriented to person, place, and time. Mental status is at baseline.     Cranial Nerves: No cranial nerve deficit.     Coordination: Coordination normal.  Psychiatric:        Mood and Affect: Mood normal.       CMP Latest Ref Rng & Units 07/15/2019  Glucose 70 - 99 mg/dL 105(H)  BUN 6 - 20 mg/dL 25(H)  Creatinine 0.44 - 1.00 mg/dL 1.27(H)  Sodium 135 - 145 mmol/L 135  Potassium 3.5 - 5.1 mmol/L 3.5  Chloride 98 - 111 mmol/L 102  CO2 22 - 32 mmol/L 26  Calcium 8.9 - 10.3 mg/dL 9.4  Total Protein 6.0 - 8.5 g/dL -  Total Bilirubin 0.0 - 1.2 mg/dL -  Alkaline Phos 39 - 117 IU/L -  AST 0 - 40 IU/L -  ALT 0 - 32 IU/L -   CBC Latest Ref Rng & Units 11/06/2019  WBC 4.0 - 10.5 K/uL 6.3  Hemoglobin 12.0 - 15.0 g/dL 13.9  Hematocrit 36 - 46 % 41.4  Platelets 150 - 400 K/uL 243     LABORATORY DATA:  I have reviewed the data as listed Lab Results  Component Value Date   WBC 6.3 11/06/2019   HGB 13.9 11/06/2019   HCT 41.4 11/06/2019   MCV 84.7 11/06/2019   PLT 243 11/06/2019   Recent Labs    06/03/19 1147 07/15/19 1840  NA 141 135  K 3.9 3.5  CL 106 102  CO2 22 26  GLUCOSE 114* 105*  BUN 16 25*  CREATININE 1.19* 1.27*  CALCIUM 9.5 9.4  GFRNONAA 50* 46*  GFRAA 58* 53*  PROT 6.8  --   ALBUMIN 4.2  --   AST 25  --   ALT 14  --   ALKPHOS 166*  --   BILITOT 0.3  --    Iron/TIBC/Ferritin/ %Sat    Component Value Date/Time   IRON 40  11/06/2019 1108   IRON 40 06/03/2019 1147   IRON 75 01/15/2013 1415   TIBC 465 (H) 11/06/2019 1108   TIBC 447 06/03/2019 1147   TIBC 336 01/15/2013 1415   FERRITIN 18 11/06/2019 1108   FERRITIN 14 (L) 06/03/2019 1147   FERRITIN 255 01/15/2013 1415   IRONPCTSAT 9 (L) 11/06/2019 1108   IRONPCTSAT 9 (LL) 06/03/2019 1147   IRONPCTSAT 22 01/15/2013 1415     RADIOGRAPHIC STUDIES: I have personally reviewed the radiological images as listed and agreed with the findings in the report. No results found.     ASSESSMENT & PLAN:  1. Iron deficiency   2. History of lobular carcinoma in situ (LCIS) of breast   3. Tobacco abuse   4. Other fatigue    #  Iron deficiency,  No anemia.  Labs are reviewed and discussed with patient. Chronic fatigue. She can not tolerate oral iron. Recommend IV venofer 260m x 1.  Etiology of iron deficiency - chronic blood loss vs other etiology.   # Chronic fatigue is unproportional to her hemoglobin level. ? Erythrocytosis which is masked by her iron deficiency.   #Patient complains of history of chronic dysuria, " blood in urine".  History of bladder prolapse. Patient previously followed up with urology and prefers to establish care with a local urology group. Recommend her to further discuss with primary care provider.   Recommend lung cancer screening program.   History of LCIS. mammogram surveillance was independent reviewed by me discussed with patient.  She is due for mammogram in April 2022  Orders Placed This Encounter  Procedures  . CBC with Differential/Platelet    Standing Status:   Future    Standing Expiration Date:   11/08/2020  . Ferritin    Standing Status:   Future    Standing Expiration Date:   11/08/2020  . Iron and TIBC    Standing Status:   Future    Standing Expiration Date:   11/08/2020    All questions were answered. The patient knows to call the clinic with any problems questions or concerns.  Cc BRonnell Freshwater  NP  Return of visit: 6 months  ZEarlie Server MD, PhD Hematology Oncology CM[facility]at [facility]8/30/2021

## 2019-11-09 NOTE — Progress Notes (Signed)
Patient reports chronic fatigue is not improving.

## 2019-11-10 ENCOUNTER — Other Ambulatory Visit: Payer: Self-pay

## 2019-11-10 ENCOUNTER — Inpatient Hospital Stay: Payer: Medicaid Other

## 2019-11-10 VITALS — BP 149/97 | HR 76 | Temp 97.0°F

## 2019-11-10 DIAGNOSIS — Z720 Tobacco use: Secondary | ICD-10-CM

## 2019-11-10 DIAGNOSIS — D509 Iron deficiency anemia, unspecified: Secondary | ICD-10-CM

## 2019-11-10 MED ORDER — IRON SUCROSE 20 MG/ML IV SOLN
200.0000 mg | Freq: Once | INTRAVENOUS | Status: AC
  Administered 2019-11-10: 200 mg via INTRAVENOUS
  Filled 2019-11-10: qty 10

## 2019-11-10 MED ORDER — SODIUM CHLORIDE 0.9 % IV SOLN
Freq: Once | INTRAVENOUS | Status: AC
  Filled 2019-11-10: qty 250

## 2019-11-10 NOTE — Progress Notes (Signed)
MD made aware of pt.'s BP prior to venofer infusion. Per MD to continue with the venofer infusion. Pt has no complaints at the moment. Post infusion VSS. RN educated pt on the importance of monitoring her BP at home and per MD to reach out to her PCP if BP continues to be elevated for possible antihypertensive medications. Pt verbalized understanding. VSS prior to discharge. Pt stable for discharge.   Holly Espinoza [employer]

## 2019-11-11 ENCOUNTER — Other Ambulatory Visit: Payer: Self-pay

## 2019-11-11 DIAGNOSIS — M1A9XX0 Chronic gout, unspecified, without tophus (tophi): Secondary | ICD-10-CM

## 2019-11-11 MED ORDER — ALLOPURINOL 100 MG PO TABS: ORAL_TABLET | ORAL | 3 refills | Status: AC

## 2019-11-22 ENCOUNTER — Telehealth: Payer: Self-pay

## 2019-11-22 NOTE — Telephone Encounter (Signed)
Contacted patient for lung CT screening clinic after receiving referral from Dr. Tasia Catchings.  Message left for patient to call Burgess Estelle, lung navigator to schedule CT scan.

## 2019-12-02 ENCOUNTER — Telehealth: Payer: Self-pay

## 2019-12-02 NOTE — Telephone Encounter (Signed)
Contacted patient for lung CT screening program based on referral from Dr. Tasia Catchings.  Message left for patient to call Burgess Estelle, lung navigator to schedule CT scan.

## 2019-12-08 ENCOUNTER — Ambulatory Visit: Payer: Medicaid Other | Admitting: Nurse Practitioner

## 2019-12-08 ENCOUNTER — Other Ambulatory Visit: Payer: Self-pay

## 2019-12-08 DIAGNOSIS — E538 Deficiency of other specified B group vitamins: Secondary | ICD-10-CM

## 2019-12-08 MED ORDER — CYANOCOBALAMIN 1000 MCG/ML IJ SOLN: 1000.0000 ug | INTRAMUSCULAR | 3 refills | Status: DC

## 2019-12-18 ENCOUNTER — Telehealth: Payer: Self-pay

## 2019-12-18 NOTE — Telephone Encounter (Signed)
Contacted patient again for lung CT screening clinic based on referral from Dr. Tasia Catchings. Message left for patient to call Burgess Estelle, lung navigator to schedule CT scan.

## 2019-12-21 ENCOUNTER — Other Ambulatory Visit: Payer: Self-pay

## 2019-12-21 DIAGNOSIS — M542 Cervicalgia: Secondary | ICD-10-CM

## 2019-12-21 DIAGNOSIS — M25512 Pain in left shoulder: Secondary | ICD-10-CM

## 2019-12-21 DIAGNOSIS — G8929 Other chronic pain: Secondary | ICD-10-CM

## 2019-12-21 MED ORDER — MELOXICAM 7.5 MG PO TABS: 7.5000 mg | ORAL_TABLET | Freq: Two times a day (BID) | ORAL | 3 refills | Status: DC | PRN

## 2019-12-28 ENCOUNTER — Encounter: Payer: Self-pay | Admitting: *Deleted

## 2019-12-29 ENCOUNTER — Other Ambulatory Visit: Payer: Self-pay

## 2019-12-29 DIAGNOSIS — E1165 Type 2 diabetes mellitus with hyperglycemia: Secondary | ICD-10-CM

## 2019-12-29 MED ORDER — LIRAGLUTIDE 18 MG/3ML ~~LOC~~ SOPN: PEN_INJECTOR | SUBCUTANEOUS | 0 refills | Status: DC

## 2019-12-30 ENCOUNTER — Ambulatory Visit: Payer: Medicaid Other | Admitting: Hospice and Palliative Medicine

## 2019-12-30 ENCOUNTER — Encounter: Payer: Self-pay | Admitting: Hospice and Palliative Medicine

## 2019-12-30 DIAGNOSIS — J0140 Acute pansinusitis, unspecified: Secondary | ICD-10-CM | POA: Diagnosis not present

## 2019-12-30 DIAGNOSIS — F410 Panic disorder [episodic paroxysmal anxiety] without agoraphobia: Secondary | ICD-10-CM | POA: Diagnosis not present

## 2019-12-30 DIAGNOSIS — F411 Generalized anxiety disorder: Secondary | ICD-10-CM | POA: Diagnosis not present

## 2019-12-30 MED ORDER — AMOXICILLIN-POT CLAVULANATE 875-125 MG PO TABS: 1.0000 | ORAL_TABLET | Freq: Two times a day (BID) | ORAL | 0 refills | Status: DC

## 2019-12-30 NOTE — Progress Notes (Signed)
[facility] [place], [place] [address]  Internal MEDICINE  Telephone Visit  Patient Name: Holly Espinoza  [id number]  [id number]  Date of Service: 01/02/2020  I connected with the patient at 1632 by telephone and verified the patients identity using two identifiers.   I discussed the limitations, risks, security and privacy concerns of performing an evaluation and management service by telephone and the availability of in person appointments. I also discussed with the patient that there may be a patient responsible charge related to the service.  The patient expressed understanding and agrees to proceed.    Chief Complaint  Patient presents with  . Acute Visit    cough, cheast pressure, mucus in throat, nose feels full, green mucus, right ear hurts, headache top of head but not forhead, not had covid test, left foot hurts  . Telephone Screen    [phone]  . Telephone Assessment    phone call  . Diabetes  . Hyperlipidemia  . Anxiety  . Depression  . Anemia  . Quality Metric Gaps    A1c, TDAP  . Cough  . Sinusitis    HPI Patient is being seen for a sick visit She has been sick for about 3 weeks Complains of chest pressure, sinus pressure and headache, congestion, ear ache, productive coughing Symptoms have not gotten any better since initial onset, she feels they have gotten worse She has been taking Sudafed over the counter which helps her symptoms but they are not going away She has been running low grade fevers, highest temp of 100 Her granddaughter lives with her and she has also recently  Current Medication: Outpatient Encounter Medications as of 12/30/2019  Medication Sig  . allopurinol (ZYLOPRIM) 100 MG tablet Take 2 tab po at bedtime  . AMITIZA 24 MCG capsule Take 24 mcg by mouth 2 (two) times daily.  Marland Kitchen aspirin 81 MG chewable tablet Chew 81 mg by mouth daily.  . Colchicine (MITIGARE) 0.6 MG CAPS Take 1.2 mg by mouth See admin  instructions. At onset of gout flare. May repeat one additional capsule 1 hour later if symptoms persist.  . cyanocobalamin (,VITAMIN B-12,) 1000 MCG/ML injection Inject 1 mL (1,000 mcg total) into the muscle every 30 (thirty) days.  . Cyanocobalamin (B-12) 1000 MCG/ML KIT Inject 1,000 mcg as directed every 30 (thirty) days.  . diazepam (VALIUM) 5 MG tablet Take 1/2 to 1 tablet po BID PRN only.  . DULoxetine (CYMBALTA) 60 MG capsule Take 1 capsule (60 mg total) by mouth 2 (two) times daily.  Marland Kitchen gemfibrozil (LOPID) 600 MG tablet Take 1 tablet (600 mg total) by mouth 2 (two) times daily before a meal.  . hydrochlorothiazide (HYDRODIURIL) 25 MG tablet Take 1 tablet (25 mg total) by mouth daily.  . Insulin Pen Needle (PEN NEEDLES 5/16") 30G X 8 MM MISC To use every day with victoza injections - E11.65  . levothyroxine (SYNTHROID) 75 MCG tablet Take 1 tablet (75 mcg total) by mouth daily before breakfast.  . liraglutide (VICTOZA) 18 MG/3ML SOPN Inject  1.8 mg [place]  daily  . meloxicam (MOBIC) 7.5 MG tablet Take 1 tablet (7.5 mg total) by mouth 2 (two) times daily as needed for pain.  Marland Kitchen omeprazole (PRILOSEC) 40 MG capsule Take 1 capsule (40 mg total) by mouth daily.  . ondansetron (ZOFRAN) 4 MG tablet Take 1 tablet (4 mg total) by mouth every 8 (eight) hours as needed for nausea. For nausea  . oxyCODONE-acetaminophen (PERCOCET) 10-325  MG tablet Take 1 tablet by mouth every 8 (eight) hours.  . OXYCONTIN 10 MG 12 hr tablet Take 10 mg by mouth every 12 (twelve) hours.   . pregabalin (LYRICA) 50 MG capsule Take 50 mg by mouth 2 (two) times daily.  Marland Kitchen topiramate (TOPAMAX) 100 MG tablet Take 100 mg by mouth 2 (two) times daily.  Marland Kitchen amoxicillin-clavulanate (AUGMENTIN) 875-125 MG tablet Take 1 tablet by mouth 2 (two) times daily.  . [DISCONTINUED] ergocalciferol (DRISDOL) 1.25 MG (50000 UT) capsule Take 1 capsule (50,000 Units total) by mouth once a week. (Patient not taking: Reported on 11/09/2019)  . [DISCONTINUED]  ferrous sulfate (FERROUSUL) 325 (65 FE) MG tablet Take 1 tablet (325 mg total) by mouth 2 (two) times daily with a meal. (Patient not taking: Reported on 12/30/2019)  . [DISCONTINUED] fexofenadine (ALLEGRA) 180 MG tablet Take 180 mg by mouth daily. (Patient not taking: Reported on 11/09/2019)  . [DISCONTINUED] fluconazole (DIFLUCAN) 150 MG tablet Take 1 tablet (150 mg total) by mouth daily. Take one tablet once and may repeat if symptoms persist in 3 days (Patient not taking: Reported on 11/09/2019)  . [DISCONTINUED] furosemide (LASIX) 20 MG tablet Take 1 tablet (20 mg total) by mouth daily as needed for fluid or edema. (Patient not taking: Reported on 06/09/2019)   No facility-administered encounter medications on file as of 12/30/2019.    Surgical History: Past Surgical History:  Procedure Laterality Date  . ABDOMINAL HYSTERECTOMY  2001?  . ANTERIOR AND POSTERIOR REPAIR N/A 11/01/2015   Procedure: CYSTOSCOPY, ANTERIOR (CYSTOCELE) AND POSTERIOR REPAIR (RECTOCELE);  Surgeon: Bjorn Loser, MD;  Location: [facility];  Service: Urology;  Laterality: N/A;  . ANTERIOR CERVICAL DECOMP/DISCECTOMY FUSION  2012  . BACK SURGERY  2001   laminectomy  . BREAST BIOPSY Left 2014  . BREAST LUMPECTOMY Left 02/25/2013  . BREAST SURGERY Left 02/25/13   lumpectomy  . CHOLECYSTECTOMY  1980's  . DILATION AND CURETTAGE OF UTERUS  [phone]   "probably 3" (07/08/2012)  . FRACTURE SURGERY     C4-7 Surgery for fracture  . LUMBAR WOUND DEBRIDEMENT N/A 08/12/2012   Procedure: LUMBAR WOUND DEBRIDEMENT;  Surgeon: Faythe Ghee, MD;  Location: [facility];  Service: Neurosurgery;  Laterality: N/A;  Lumbar Wound Debridement  . POSTERIOR LUMBAR FUSION  07/08/2012  . TONSILLECTOMY  ~ 1968    Medical History: Past Medical History:  Diagnosis Date  . Anxiety   . Arthritis    "pretty much from my neck to my feet" (07/08/2012)  . B12 deficiency anemia   . Chronic lower back pain   . Depression   . Diabetic  peripheral neuropathy ([place])   . Gout   . High cholesterol   . IDA (iron deficiency anemia) 06/17/2019  . 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 Neuromuscular disorder ([place])   . Rheumatoid arthritis ([place])   . Type II diabetes mellitus (HCC)    recent hgb A1C was 5.4 per Dr. Humphrey Rolls, [place], [place]    Family History: Family History  Problem Relation Age of Onset  . Kidney disease Father   . Heart disease Father   . Diabetes Father   . Hypertension Mother   . Depression Mother   . Cancer Other        breast great grandmother  . Breast cancer Other   . COPD Brother   . Heart failure Brother   . Diabetes Brother   . COPD Brother     Social History  Socioeconomic History  . Marital status: Single    Spouse name: Not on file  . Number of children: Not on file  . Years of education: Not on file  . Highest education level: Not on file  Occupational History  . Occupation: disability  Tobacco Use  . Smoking status: Current Every Day Smoker    Packs/day: 0.50    Years: 40.00    Pack years: 20.00    Types: Cigarettes  . Smokeless tobacco: Never Used  Vaping Use  . Vaping Use: Never used  Substance and Sexual Activity  . Alcohol use: No  . Drug use: No    Types: Marijuana, Cocaine    Comment: 07/08/2012 "last drug use >8 yr ago" (07/08/2012)  . Sexual activity: Yes    Comment: quit a long time ago  Other Topics Concern  . Not on file  Social History Narrative  . Not on file   Social Determinants of Health   Financial Resource Strain:   . Difficulty of Paying Living Expenses: Not on file  Food Insecurity:   . Worried About Charity fundraiser in the Last Year: Not on file  . Ran Out of Food in the Last Year: Not on file  Transportation Needs:   . Lack of Transportation (Medical): Not on file  . Lack of Transportation (Non-Medical): Not on file  Physical Activity:   . Days of Exercise per Week: Not on file  . Minutes of Exercise per Session: Not  on file  Stress:   . Feeling of Stress : Not on file  Social Connections:   . Frequency of Communication with Friends and Family: Not on file  . Frequency of Social Gatherings with Friends and Family: Not on file  . Attends Religious Services: Not on file  . Active Member of Clubs or Organizations: Not on file  . Attends Archivist Meetings: Not on file  . Marital Status: Not on file  Intimate Partner Violence:   . Fear of Current or Ex-Partner: Not on file  . Emotionally Abused: Not on file  . Physically Abused: Not on file  . Sexually Abused: Not on file   Review of Systems  Constitutional: Positive for fatigue. Negative for chills and diaphoresis.  HENT: Positive for congestion, ear pain, sinus pressure and sinus pain. Negative for postnasal drip.   Eyes: Negative for photophobia, discharge, redness, itching and visual disturbance.  Respiratory: Positive for cough. Negative for shortness of breath and wheezing.   Cardiovascular: Negative for chest pain, palpitations and leg swelling.  Gastrointestinal: Negative for abdominal pain, constipation, diarrhea, nausea and vomiting.  Genitourinary: Negative for dysuria and flank pain.  Musculoskeletal: Negative for arthralgias, back pain, gait problem and neck pain.  Skin: Negative for color change.  Allergic/Immunologic: Negative for environmental allergies and food allergies.  Neurological: Negative for dizziness and headaches.  Hematological: Does not bruise/bleed easily.  Psychiatric/Behavioral: Negative for agitation, behavioral problems (depression) and hallucinations.    Vital Signs: Temp 100 F (37.8 C)   Resp 16   Ht _0  (1.753 m)   Wt 200 lb (90.7 kg)   BMI 29.53 kg/m    Observation/Objective: No acute distress noted, congestion heard in nose and voice   Assessment/Plan: 1. Acute non-recurrent pansinusitis Will treat with 5 days of Augmentin--advised to contact office if symptoms worsen or have not  resolved within 10 days - amoxicillin-clavulanate (AUGMENTIN) 875-125 MG tablet; Take 1 tablet by mouth 2 (two) times daily.  Dispense: 10  tablet; Refill: 0  2. Generalized anxiety disorder with panic attacks Requesting refills of Valium today--advised due to updated controlled substance policy, unable to refill controlled substances via a virtual visit, will need in office visit to discuss anxiety management  General Counseling: akirah storck understanding of the findings of today's phone visit and agrees with plan of treatment. I have discussed any further diagnostic evaluation that may be needed or ordered today. We also reviewed her medications today. she has been encouraged to call the office with any questions or concerns that should arise related to todays visit.   Meds ordered this encounter  Medications  . amoxicillin-clavulanate (AUGMENTIN) 875-125 MG tablet    Sig: Take 1 tablet by mouth 2 (two) times daily.    Dispense:  10 tablet    Refill:  0     Time spent:30 Minutes Time spent includes review of chart, medications, test results and follow-up plan with the patient.  Tanna Furry Arnel Wymer AGNP-C Internal medicine

## 2020-01-02 ENCOUNTER — Encounter: Payer: Self-pay | Admitting: Hospice and Palliative Medicine

## 2020-01-04 ENCOUNTER — Other Ambulatory Visit: Payer: Self-pay

## 2020-01-04 ENCOUNTER — Encounter: Payer: Self-pay | Admitting: Internal Medicine

## 2020-01-04 ENCOUNTER — Telehealth: Payer: Self-pay

## 2020-01-04 ENCOUNTER — Ambulatory Visit (INDEPENDENT_AMBULATORY_CARE_PROVIDER_SITE_OTHER): Payer: Medicaid Other | Admitting: Internal Medicine

## 2020-01-04 VITALS — BP 166/108 | HR 85 | Temp 97.6°F | Resp 16 | Ht 69.0 in | Wt 206.8 lb

## 2020-01-04 DIAGNOSIS — J209 Acute bronchitis, unspecified: Secondary | ICD-10-CM

## 2020-01-04 DIAGNOSIS — F17210 Nicotine dependence, cigarettes, uncomplicated: Secondary | ICD-10-CM | POA: Diagnosis not present

## 2020-01-04 DIAGNOSIS — F410 Panic disorder [episodic paroxysmal anxiety] without agoraphobia: Secondary | ICD-10-CM

## 2020-01-04 DIAGNOSIS — I10 Essential (primary) hypertension: Secondary | ICD-10-CM

## 2020-01-04 DIAGNOSIS — Z122 Encounter for screening for malignant neoplasm of respiratory organs: Secondary | ICD-10-CM

## 2020-01-04 DIAGNOSIS — E782 Mixed hyperlipidemia: Secondary | ICD-10-CM

## 2020-01-04 DIAGNOSIS — J440 Chronic obstructive pulmonary disease with acute lower respiratory infection: Secondary | ICD-10-CM

## 2020-01-04 DIAGNOSIS — F411 Generalized anxiety disorder: Secondary | ICD-10-CM

## 2020-01-04 DIAGNOSIS — Z87891 Personal history of nicotine dependence: Secondary | ICD-10-CM

## 2020-01-04 DIAGNOSIS — E1165 Type 2 diabetes mellitus with hyperglycemia: Secondary | ICD-10-CM | POA: Diagnosis not present

## 2020-01-04 DIAGNOSIS — N182 Chronic kidney disease, stage 2 (mild): Secondary | ICD-10-CM

## 2020-01-04 LAB — POCT GLYCOSYLATED HEMOGLOBIN (HGB A1C): Hemoglobin A1C: 5.7 % — AB (ref 4.0–5.6)

## 2020-01-04 MED ORDER — DIAZEPAM 5 MG PO TABS: ORAL_TABLET | ORAL | 2 refills | Status: DC

## 2020-01-04 MED ORDER — BUDESONIDE-FORMOTEROL FUMARATE 80-4.5 MCG/ACT IN AERO: 2.0000 | INHALATION_SPRAY | Freq: Two times a day (BID) | RESPIRATORY_TRACT | 3 refills | Status: DC

## 2020-01-04 MED ORDER — BISOPROLOL-HYDROCHLOROTHIAZIDE 5-6.25 MG PO TABS: ORAL_TABLET | ORAL | 1 refills | Status: DC

## 2020-01-04 MED ORDER — LEVOFLOXACIN 500 MG PO TABS: 500.0000 mg | ORAL_TABLET | Freq: Every day | ORAL | 0 refills | Status: DC

## 2020-01-04 NOTE — Progress Notes (Signed)
[facility] [place], [place] [address]  Internal MEDICINE  Office Visit Note  Patient Name: Holly Espinoza  [id number]  [id number]  Date of Service: 01/04/2020  Chief Complaint  Patient presents with  . Follow-up    left foot has been hurting from a fall a year ago, sometimes swells, refill request, discuss BP  . Anemia  . Anxiety  . Depression  . Diabetes  . Hyperlipidemia  . policy update form    received  . Quality Metric Gaps    eye exam    HPI Pt is here with multiple complaints.  1. Ongoing cough and congestion, she was treated with abx however symptoms have not improved, she is having sob as well, she is a current smoker. CXR 2021 did show chronic bronchitis and interstitial changes  2. She will need to scheduled for diabetic eye exam, diabetes is under very good control.  3. Labs reviewed with her and has CKD  4. BP is elevated and does not seem to be well controlled. 5. Previous echo did show diastolic dysfunction and sleep study was ordered, pt was unable to have this done 6. I have reviewed  PDMP with her  Current Medication: Outpatient Encounter Medications as of 01/04/2020  Medication Sig  . allopurinol (ZYLOPRIM) 100 MG tablet Take 2 tab po at bedtime  . AMITIZA 24 MCG capsule Take 24 mcg by mouth 2 (two) times daily.  Marland Kitchen amoxicillin-clavulanate (AUGMENTIN) 875-125 MG tablet Take 1 tablet by mouth 2 (two) times daily.  Marland Kitchen aspirin 81 MG chewable tablet Chew 81 mg by mouth daily.  . bisoprolol-hydrochlorothiazide (ZIAC) 5-6.25 MG tablet Take one tab po qd for bp  . budesonide-formoterol (SYMBICORT) 80-4.5 MCG/ACT inhaler Inhale 2 puffs into the lungs 2 (two) times daily.  . Colchicine (MITIGARE) 0.6 MG CAPS Take 1.2 mg by mouth See admin instructions. At onset of gout flare. May repeat one additional capsule 1 hour later if symptoms persist.  . cyanocobalamin (,VITAMIN B-12,) 1000 MCG/ML injection Inject 1 mL (1,000 mcg total) into the  muscle every 30 (thirty) days.  . Cyanocobalamin (B-12) 1000 MCG/ML KIT Inject 1,000 mcg as directed every 30 (thirty) days.  . diazepam (VALIUM) 5 MG tablet Take 1/2 to 1 tablet po BID PRN only.  . DULoxetine (CYMBALTA) 60 MG capsule Take 1 capsule (60 mg total) by mouth 2 (two) times daily.  Marland Kitchen gemfibrozil (LOPID) 600 MG tablet Take 1 tablet (600 mg total) by mouth 2 (two) times daily before a meal.  . hydrochlorothiazide (HYDRODIURIL) 25 MG tablet Take 1 tablet (25 mg total) by mouth daily.  . Insulin Pen Needle (PEN NEEDLES 5/16") 30G X 8 MM MISC To use every day with victoza injections - E11.65  . levofloxacin (LEVAQUIN) 500 MG tablet Take 1 tablet (500 mg total) by mouth daily.  Marland Kitchen levothyroxine (SYNTHROID) 75 MCG tablet Take 1 tablet (75 mcg total) by mouth daily before breakfast.  . liraglutide (VICTOZA) 18 MG/3ML SOPN Inject  1.8 mg [place]  daily  . meloxicam (MOBIC) 7.5 MG tablet Take 1 tablet (7.5 mg total) by mouth 2 (two) times daily as needed for pain.  Marland Kitchen omeprazole (PRILOSEC) 40 MG capsule Take 1 capsule (40 mg total) by mouth daily.  . ondansetron (ZOFRAN) 4 MG tablet Take 1 tablet (4 mg total) by mouth every 8 (eight) hours as needed for nausea. For nausea  . oxyCODONE-acetaminophen (PERCOCET) 10-325 MG tablet Take 1 tablet by mouth every 8 (eight) hours.  Marland Kitchen  OXYCONTIN 10 MG 12 hr tablet Take 10 mg by mouth every 12 (twelve) hours.   . pregabalin (LYRICA) 50 MG capsule Take 50 mg by mouth 2 (two) times daily.  Marland Kitchen topiramate (TOPAMAX) 100 MG tablet Take 100 mg by mouth 2 (two) times daily.  . [DISCONTINUED] diazepam (VALIUM) 5 MG tablet Take 1/2 to 1 tablet po BID PRN only.   No facility-administered encounter medications on file as of 01/04/2020.    Surgical History: Past Surgical History:  Procedure Laterality Date  . ABDOMINAL HYSTERECTOMY  2001?  . ANTERIOR AND POSTERIOR REPAIR N/A 11/01/2015   Procedure: CYSTOSCOPY, ANTERIOR (CYSTOCELE) AND POSTERIOR REPAIR (RECTOCELE);   Surgeon: Bjorn Loser, MD;  Location: [facility];  Service: Urology;  Laterality: N/A;  . ANTERIOR CERVICAL DECOMP/DISCECTOMY FUSION  2012  . BACK SURGERY  2001   laminectomy  . BREAST BIOPSY Left 2014  . BREAST LUMPECTOMY Left 02/25/2013  . BREAST SURGERY Left 02/25/13   lumpectomy  . CHOLECYSTECTOMY  1980's  . DILATION AND CURETTAGE OF UTERUS  [phone]   "probably 3" (07/08/2012)  . FRACTURE SURGERY     C4-7 Surgery for fracture  . LUMBAR WOUND DEBRIDEMENT N/A 08/12/2012   Procedure: LUMBAR WOUND DEBRIDEMENT;  Surgeon: Faythe Ghee, MD;  Location: [facility];  Service: Neurosurgery;  Laterality: N/A;  Lumbar Wound Debridement  . POSTERIOR LUMBAR FUSION  07/08/2012  . TONSILLECTOMY  ~ 1968    Medical History: Past Medical History:  Diagnosis Date  . Anxiety   . Arthritis    "pretty much from my neck to my feet" (07/08/2012)  . B12 deficiency anemia   . Chronic lower back pain   . Depression   . Diabetic peripheral neuropathy ([place])   . Gout   . High cholesterol   . IDA (iron deficiency anemia) 06/17/2019  . 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 Neuromuscular disorder ([place])   . Rheumatoid arthritis ([place])   . Type II diabetes mellitus (HCC)    recent hgb A1C was 5.4 per Dr. Humphrey Rolls, [place], [place]    Family History: Family History  Problem Relation Age of Onset  . Kidney disease Father   . Heart disease Father   . Diabetes Father   . Hypertension Mother   . Depression Mother   . Cancer Other        breast great grandmother  . Breast cancer Other   . COPD Brother   . Heart failure Brother   . Diabetes Brother   . COPD Brother     Social History   Socioeconomic History  . Marital status: Single    Spouse name: Not on file  . Number of children: Not on file  . Years of education: Not on file  . Highest education level: Not on file  Occupational History  . Occupation: disability  Tobacco Use  . Smoking status: Current Every Day  Smoker    Packs/day: 0.50    Years: 40.00    Pack years: 20.00    Types: Cigarettes  . Smokeless tobacco: Never Used  Vaping Use  . Vaping Use: Never used  Substance and Sexual Activity  . Alcohol use: No  . Drug use: No    Types: Marijuana, Cocaine    Comment: 07/08/2012 "last drug use >8 yr ago" (07/08/2012)  . Sexual activity: Yes    Comment: quit a long time ago  Other Topics Concern  . Not on file  Social History Narrative  .  Not on file   Social Determinants of Health   Financial Resource Strain:   . Difficulty of Paying Living Expenses: Not on file  Food Insecurity:   . Worried About Charity fundraiser in the Last Year: Not on file  . Ran Out of Food in the Last Year: Not on file  Transportation Needs:   . Lack of Transportation (Medical): Not on file  . Lack of Transportation (Non-Medical): Not on file  Physical Activity:   . Days of Exercise per Week: Not on file  . Minutes of Exercise per Session: Not on file  Stress:   . Feeling of Stress : Not on file  Social Connections:   . Frequency of Communication with Friends and Family: Not on file  . Frequency of Social Gatherings with Friends and Family: Not on file  . Attends Religious Services: Not on file  . Active Member of Clubs or Organizations: Not on file  . Attends Archivist Meetings: Not on file  . Marital Status: Not on file  Intimate Partner Violence:   . Fear of Current or Ex-Partner: Not on file  . Emotionally Abused: Not on file  . Physically Abused: Not on file  . Sexually Abused: Not on file    Review of Systems  Constitutional: Negative for chills, diaphoresis and fatigue.  HENT: Negative for ear pain, postnasal drip and sinus pressure.   Eyes: Negative for photophobia, discharge, redness, itching and visual disturbance.  Respiratory: Positive for cough and shortness of breath. Negative for wheezing.   Cardiovascular: Negative for chest pain, palpitations and leg swelling.   Gastrointestinal: Negative for abdominal pain, constipation, diarrhea, nausea and vomiting.  Genitourinary: Negative for dysuria and flank pain.  Musculoskeletal: Positive for arthralgias. Negative for back pain, gait problem and neck pain.  Skin: Negative for color change.  Allergic/Immunologic: Negative for environmental allergies and food allergies.  Neurological: Negative for dizziness and headaches.  Hematological: Does not bruise/bleed easily.  Psychiatric/Behavioral: Positive for dysphoric mood. Negative for agitation, behavioral problems (depression) and hallucinations.    Vital Signs: BP (!) 166/108   Pulse 85   Temp 97.6 F (36.4 C)   Resp 16   Ht 5' 9"  (1.753 m)   Wt 206 lb 12.8 oz (93.8 kg)   SpO2 98%   BMI 30.54 kg/m    Physical Exam Constitutional:      General: She is not in acute distress.    Appearance: She is well-developed. She is not diaphoretic.  HENT:     Head: Normocephalic and atraumatic.     Mouth/Throat:     Pharynx: No oropharyngeal exudate.  Eyes:     Pupils: Pupils are equal, round, and reactive to light.  Neck:     Thyroid: No thyromegaly.     Vascular: No JVD.     Trachea: No tracheal deviation.  Cardiovascular:     Rate and Rhythm: Normal rate and regular rhythm.     Heart sounds: Normal heart sounds. No murmur heard.  No friction rub. No gallop.   Pulmonary:     Effort: No respiratory distress.     Breath sounds: Wheezing present. No rales.     Comments: Decreased air entry  Chest:     Chest wall: No tenderness.  Abdominal:     General: Bowel sounds are normal.     Palpations: Abdomen is soft.  Musculoskeletal:     Cervical back: Normal range of motion and neck supple.  Comments: Poor posture,   Lymphadenopathy:     Cervical: No cervical adenopathy.  Skin:    General: Skin is warm and dry.  Neurological:     Mental Status: She is alert and oriented to person, place, and time.     Cranial Nerves: No cranial nerve deficit.   Psychiatric:        Behavior: Behavior normal.        Thought Content: Thought content normal.        Judgment: Judgment normal.    Assessment/Plan: 1. Type 2 diabetes mellitus with hyperglycemia, without long-term current use of insulin (HCC) Continue all medications as before, good target hg A1c of 5.8 - POCT HgB A1C - Ambulatory referral to Ophthalmology  2. Acute bronchitis with COPD ([place]) Will start Levaquin 500 mg qd x 10 days, sample of Breo is given until Symbicort is approved - CT CHEST LUNG CA SCREEN LOW DOSE W/O CM; Future - might need PFT;s   3. Smoking greater than 20 pack years Pt continues to be a smoker,s he is not ready to quit at this moment  - CT CHEST LUNG CA SCREEN LOW DOSE W/O CM; Future Smoking cessation counseling: 1. Pt acknowledges the risks of long term smoking, she will try to quite smoking. 2. Options for different medications including nicotine products, chewing gum, patch etc, Wellbutrin and Chantix is discussed 3. Goal and date of compete cessation is discussed 4. Total time spent in smoking cessation is 10 min.  4. Generalized anxiety disorder with panic attacks Her overdose score is reviewed with her, pt will need sleep study or overnight pox to monitor her degree of hypoxia, did have diastolic dysfunction  - diazepam (VALIUM) 5 MG tablet; Take 1/2 to 1 tablet po BID PRN only.  Dispense: 45 tablet; Refill: 2  5. Mixed hyperlipidemia Pt is on Gemfibrozil, will get benefit from statin therapy  - Lipid Panel With LDL/HDL Ratio  6. Hypertension, essential DC hctx, will start bisoprolol, will need to add ACE inh as well  - Comprehensive metabolic panel  7. Chronic renal disease, stage II Pt has baseline elevated renal functions, will repeat and look into it further, needs better BP control   - Comprehensive metabolic panel  General Counseling: aprill banko understanding of the findings of todays visit and agrees with plan of treatment. I  have discussed any further diagnostic evaluation that may be needed or ordered today. We also reviewed her medications today. she has been encouraged to call the office with any questions or concerns that should arise related to todays visit.  Orders Placed This Encounter  Procedures  . CT CHEST LUNG CA SCREEN LOW DOSE W/O CM  . Ambulatory referral to Ophthalmology  . POCT HgB A1C    Meds ordered this encounter  Medications  . levofloxacin (LEVAQUIN) 500 MG tablet    Sig: Take 1 tablet (500 mg total) by mouth daily.    Dispense:  10 tablet    Refill:  0  . diazepam (VALIUM) 5 MG tablet    Sig: Take 1/2 to 1 tablet po BID PRN only.    Dispense:  45 tablet    Refill:  2    Take 1 tab po bid prn for muscle pain and tightness.  . bisoprolol-hydrochlorothiazide (ZIAC) 5-6.25 MG tablet    Sig: Take one tab po qd for bp    Dispense:  30 tablet    Refill:  1  . budesonide-formoterol (SYMBICORT) 80-4.5 MCG/ACT inhaler  Sig: Inhale 2 puffs into the lungs 2 (two) times daily.    Dispense:  1 each    Refill:  3    Total time spent: 30 Minutes Time spent includes review of chart, medications, test results, and follow up plan with the patient.    Dr Lavera Guise Internal medicine

## 2020-01-04 NOTE — Telephone Encounter (Signed)
Ms. Brailsford called to schedule lung screening CT. We will reach out and get this scheduled for her.

## 2020-01-04 NOTE — Telephone Encounter (Signed)
Returned patient call for lung CT screening program.  Patient agreeable to program and CT scheduled for Tues Nov 23 at 1:45.  She was given address to imaging center and phone number to [facility], lung navigator.  She confirmed that she has Medicaid [employer].  Patient started smoking at age 59 and is a current smoker.  She smokes less than a pack a day, around 75%.  She was grateful for the call to participate in the program.

## 2020-01-05 ENCOUNTER — Telehealth: Payer: Self-pay

## 2020-01-05 NOTE — Telephone Encounter (Signed)
Pt will go get her fasting lab work done when she is finished with abx

## 2020-01-05 NOTE — Telephone Encounter (Signed)
-----   Message from Lavera Guise, MD sent at 01/04/2020 11:58 AM EDT ----- Please let the pt know about her labs work, she needs to go fasting next week after finishing her abx

## 2020-01-06 ENCOUNTER — Telehealth: Payer: Self-pay | Admitting: *Deleted

## 2020-01-06 ENCOUNTER — Encounter: Payer: Self-pay | Admitting: *Deleted

## 2020-01-06 ENCOUNTER — Other Ambulatory Visit: Payer: Self-pay

## 2020-01-06 DIAGNOSIS — F17210 Nicotine dependence, cigarettes, uncomplicated: Secondary | ICD-10-CM

## 2020-01-06 NOTE — Telephone Encounter (Signed)
Received referral for low dose lung cancer screening CT scan. Message left at phone number listed in EMR for patient to call me back to facilitate scheduling scan.  

## 2020-01-06 NOTE — Addendum Note (Signed)
Addended by: Lieutenant Diego on: 01/06/2020 02:12 PM   Modules accepted: Orders

## 2020-01-06 NOTE — Telephone Encounter (Signed)
Current smoker, 33.75 pack year

## 2020-02-02 ENCOUNTER — Ambulatory Visit
Admission: RE | Admit: 2020-02-02 | Discharge: 2020-02-02 | Disposition: A | Discharge status: Home / Self Care | Payer: Medicaid Other | Source: Ambulatory Visit | Attending: Oncology | Admitting: Oncology

## 2020-02-02 ENCOUNTER — Inpatient Hospital Stay: Payer: Medicaid Other | Attending: Oncology | Admitting: Nurse Practitioner

## 2020-02-02 ENCOUNTER — Other Ambulatory Visit: Payer: Self-pay

## 2020-02-02 DIAGNOSIS — Z122 Encounter for screening for malignant neoplasm of respiratory organs: Secondary | ICD-10-CM | POA: Diagnosis present

## 2020-02-02 DIAGNOSIS — Z87891 Personal history of nicotine dependence: Secondary | ICD-10-CM

## 2020-02-02 DIAGNOSIS — F17210 Nicotine dependence, cigarettes, uncomplicated: Secondary | ICD-10-CM | POA: Diagnosis not present

## 2020-02-02 NOTE — Progress Notes (Signed)
Virtual Visit via Video Enabled Telemedicine Note   I connected with Holly Espinoza on 02/02/20 at 1:45 PM EST by video enabled telemedicine visit and verified that I am speaking with the correct person using two identifiers.   I discussed the limitations, risks, security and privacy concerns of performing an evaluation and management service by telemedicine and the availability of in-person appointments. I also discussed with the patient that there may be a patient responsible charge related to this service. The patient expressed understanding and agreed to proceed.   Other persons participating in the visit and their role in the encounter: Burgess Estelle, RN- checking in patient & navigation  Patient's location: [place]  Provider's location: Clinic  Chief Complaint: Low Dose CT Screening  Patient agreed to evaluation by telemedicine to discuss shared decision making for consideration of low dose CT lung cancer screening.    In accordance with CMS guidelines, patient has met eligibility criteria including age, absence of signs or symptoms of lung cancer.  Social History   Tobacco Use  . Smoking status: Current Every Day Smoker    Packs/day: 0.75    Years: 45.00    Pack years: 33.75    Types: Cigarettes  . Smokeless tobacco: Never Used  Substance Use Topics  . Alcohol use: No     A shared decision-making session was conducted prior to the performance of CT scan. This includes one or more decision aids, includes benefits and harms of screening, follow-up diagnostic testing, over-diagnosis, false positive rate, and total radiation exposure.   Counseling on the importance of adherence to annual lung cancer LDCT screening, impact of co-morbidities, and ability or willingness to undergo diagnosis and treatment is imperative for compliance of the program.   Counseling on the importance of continued smoking cessation for former smokers; the importance of smoking cessation for current  smokers, and information about tobacco cessation interventions have been given to patient including [place] and [employer] [place] programs.   Written order for lung cancer screening with LDCT has been given to the patient and any and all questions have been answered to the best of my abilities.    Yearly follow up will be coordinated by Burgess Estelle, Thoracic Navigator.  I discussed the assessment and treatment plan with the patient. The patient was provided an opportunity to ask questions and all were answered. The patient agreed with the plan and demonstrated an understanding of the instructions.   The patient was advised to call back or seek an in-person evaluation if the symptoms worsen or if the condition fails to improve as anticipated.   I provided 15 minutes of face-to-face video visit time during this encounter, and > 50% was spent counseling as documented under my assessment & plan.   Beckey Rutter, DNP, AGNP-C [place] at [facility] [phone] (clinic)

## 2020-02-08 ENCOUNTER — Telehealth: Payer: Self-pay | Admitting: *Deleted

## 2020-02-08 NOTE — Telephone Encounter (Signed)
Notified patient of LDCT lung cancer screening program results with recommendation for 12 month follow up imaging. Also notified of incidental findings noted below and is encouraged to discuss further with PCP who will receive a copy of this note and/or the CT report. Patient verbalizes understanding.   IMPRESSION: 1. Lung-RADS 2, benign appearance or behavior.  Aortic Atherosclerosis (ICD10-I70.0) and Emphysema (ICD10-J43.9).

## 2020-02-09 ENCOUNTER — Ambulatory Visit: Payer: Self-pay | Admitting: Internal Medicine

## 2020-02-13 LAB — COMPREHENSIVE METABOLIC PANEL
ALT: 22 IU/L (ref 0–32)
AST: 27 IU/L (ref 0–40)
Albumin/Globulin Ratio: 1.7 (ref 1.2–2.2)
Albumin: 4.3 g/dL (ref 3.8–4.9)
Alkaline Phosphatase: 157 IU/L — ABNORMAL HIGH (ref 44–121)
BUN/Creatinine Ratio: 16 (ref 12–28)
BUN: 15 mg/dL (ref 8–27)
Bilirubin Total: 0.4 mg/dL (ref 0.0–1.2)
CO2: 20 mmol/L (ref 20–29)
Calcium: 9.2 mg/dL (ref 8.7–10.3)
Chloride: 105 mmol/L (ref 96–106)
Creatinine, Ser: 0.94 mg/dL (ref 0.57–1.00)
GFR calc Af Amer: 76 mL/min/{1.73_m2} (ref >59)
GFR calc non Af Amer: 66 mL/min/{1.73_m2} (ref >59)
Globulin, Total: 2.6 g/dL (ref 1.5–4.5)
Glucose: 162 mg/dL — ABNORMAL HIGH (ref 65–99)
Potassium: 3.6 mmol/L (ref 3.5–5.2)
Sodium: 139 mmol/L (ref 134–144)
Total Protein: 6.9 g/dL (ref 6.0–8.5)

## 2020-02-13 LAB — LIPID PANEL WITH LDL/HDL RATIO
Cholesterol, Total: 208 mg/dL — ABNORMAL HIGH (ref 100–199)
HDL: 46 mg/dL (ref >39)
LDL Chol Calc (NIH): 124 mg/dL — ABNORMAL HIGH (ref 0–99)
LDL/HDL Ratio: 2.7 ratio (ref 0.0–3.2)
Triglycerides: 214 mg/dL — ABNORMAL HIGH (ref 0–149)
VLDL Cholesterol Cal: 38 mg/dL (ref 5–40)

## 2020-02-18 ENCOUNTER — Ambulatory Visit: Payer: Medicaid Other | Admitting: Internal Medicine

## 2020-02-22 ENCOUNTER — Ambulatory Visit: Payer: Medicaid Other | Admitting: Internal Medicine

## 2020-02-23 ENCOUNTER — Telehealth: Payer: Self-pay

## 2020-02-23 ENCOUNTER — Encounter: Payer: Self-pay | Admitting: Internal Medicine

## 2020-02-23 ENCOUNTER — Ambulatory Visit: Payer: Medicaid Other | Admitting: Internal Medicine

## 2020-02-23 ENCOUNTER — Other Ambulatory Visit: Payer: Self-pay

## 2020-02-23 ENCOUNTER — Other Ambulatory Visit: Payer: Self-pay | Admitting: Internal Medicine

## 2020-02-23 VITALS — BP 138/76 | HR 70 | Temp 97.1°F | Resp 16 | Ht 69.0 in | Wt 221.6 lb

## 2020-02-23 DIAGNOSIS — E063 Autoimmune thyroiditis: Secondary | ICD-10-CM

## 2020-02-23 DIAGNOSIS — E033 Postinfectious hypothyroidism: Secondary | ICD-10-CM | POA: Diagnosis not present

## 2020-02-23 DIAGNOSIS — I700 Atherosclerosis of aorta: Secondary | ICD-10-CM | POA: Diagnosis not present

## 2020-02-23 DIAGNOSIS — M79672 Pain in left foot: Secondary | ICD-10-CM

## 2020-02-23 DIAGNOSIS — E1165 Type 2 diabetes mellitus with hyperglycemia: Secondary | ICD-10-CM

## 2020-02-23 MED ORDER — LIRAGLUTIDE 18 MG/3ML ~~LOC~~ SOPN: PEN_INJECTOR | SUBCUTANEOUS | 12 refills | Status: DC

## 2020-02-23 MED ORDER — FLUCONAZOLE 150 MG PO TABS: ORAL_TABLET | ORAL | 0 refills | Status: DC

## 2020-02-23 MED ORDER — AZITHROMYCIN 250 MG PO TABS: ORAL_TABLET | ORAL | 0 refills | Status: DC

## 2020-02-23 NOTE — Telephone Encounter (Signed)
Done

## 2020-02-23 NOTE — Progress Notes (Signed)
[facility],  [address]  Internal MEDICINE  Office Visit Note  Patient Name: Holly Espinoza  [id number]  [id number]  Date of Service: 03/02/2020  Chief Complaint  Patient presents with  . Follow-up    Review labs and CT, pt fell for the 3rd time in the last 2 or 3 weeks, can hardly stand on left foot, very painful  . Hyperlipidemia  . Diabetes  . Anemia  . Anxiety    HPI  Pt is here for routine follow up but is in acute pain, she feel few days ago and has in pain. She does have h/o gout. Left ankle is swollen with minimum at bearing. She has chronic back pain after multiple surgeries and is on chronic opoid therapy with dependence.  She has not been taking her Victoza, will like to get refill on it. Recent CT chest has the following findings  1. Lung-RADS 2, benign appearance or behavior. 2. Aortic Atherosclerosis (ICD10-I70.0) and Emphysema (ICD10-J43.9). Current Medication: Outpatient Encounter Medications as of 02/23/2020  Medication Sig  . allopurinol (ZYLOPRIM) 100 MG tablet Take 2 tab po at bedtime  . AMITIZA 24 MCG capsule Take 24 mcg by mouth 2 (two) times daily.  Marland Kitchen aspirin 81 MG chewable tablet Chew 81 mg by mouth daily.  . bisoprolol-hydrochlorothiazide (ZIAC) 5-6.25 MG tablet Take one tab po qd for bp  . budesonide-formoterol (SYMBICORT) 80-4.5 MCG/ACT inhaler Inhale 2 puffs into the lungs 2 (two) times daily.  . Colchicine (MITIGARE) 0.6 MG CAPS Take 1.2 mg by mouth See admin instructions. At onset of gout flare. May repeat one additional capsule 1 hour later if symptoms persist.  . cyanocobalamin (,VITAMIN B-12,) 1000 MCG/ML injection Inject 1 mL (1,000 mcg total) into the muscle every 30 (thirty) days.  . Cyanocobalamin (B-12) 1000 MCG/ML KIT Inject 1,000 mcg as directed every 30 (thirty) days.  . diazepam (VALIUM) 5 MG tablet Take 1/2 to 1 tablet po BID PRN only.  . DULoxetine (CYMBALTA) 60 MG capsule Take 1 capsule (60 mg  total) by mouth 2 (two) times daily.  . hydrochlorothiazide (HYDRODIURIL) 25 MG tablet Take 1 tablet (25 mg total) by mouth daily.  . Insulin Pen Needle (PEN NEEDLES 5/16") 30G X 8 MM MISC To use every day with victoza injections - E11.65  . levothyroxine (SYNTHROID) 75 MCG tablet Take 1 tablet (75 mcg total) by mouth daily before breakfast.  . liraglutide (VICTOZA) 18 MG/3ML SOPN Inject  1.8 mg [place]  daily  . meloxicam (MOBIC) 7.5 MG tablet Take 1 tablet (7.5 mg total) by mouth 2 (two) times daily as needed for pain.  Marland Kitchen omeprazole (PRILOSEC) 40 MG capsule Take 1 capsule (40 mg total) by mouth daily.  . ondansetron (ZOFRAN) 4 MG tablet Take 1 tablet (4 mg total) by mouth every 8 (eight) hours as needed for nausea. For nausea  . oxyCODONE-acetaminophen (PERCOCET) 10-325 MG tablet Take 1 tablet by mouth every 8 (eight) hours.  . OXYCONTIN 10 MG 12 hr tablet Take 10 mg by mouth every 12 (twelve) hours.   . pregabalin (LYRICA) 50 MG capsule Take 50 mg by mouth 2 (two) times daily.  Marland Kitchen topiramate (TOPAMAX) 100 MG tablet Take 100 mg by mouth 2 (two) times daily.  . [DISCONTINUED] amoxicillin-clavulanate (AUGMENTIN) 875-125 MG tablet Take 1 tablet by mouth 2 (two) times daily.  . [DISCONTINUED] gemfibrozil (LOPID) 600 MG tablet Take 1 tablet (600 mg total) by mouth 2 (two) times daily before a  meal.  . [DISCONTINUED] levofloxacin (LEVAQUIN) 500 MG tablet Take 1 tablet (500 mg total) by mouth daily.  . [DISCONTINUED] liraglutide (VICTOZA) 18 MG/3ML SOPN Inject  1.8 mg [place]  daily   No facility-administered encounter medications on file as of 02/23/2020.    Surgical History: Past Surgical History:  Procedure Laterality Date  . ABDOMINAL HYSTERECTOMY  2001?  . ANTERIOR AND POSTERIOR REPAIR N/A 11/01/2015   Procedure: CYSTOSCOPY, ANTERIOR (CYSTOCELE) AND POSTERIOR REPAIR (RECTOCELE);  Surgeon: Bjorn Loser, MD;  Location: [facility];  Service: Urology;  Laterality: N/A;  . ANTERIOR CERVICAL  DECOMP/DISCECTOMY FUSION  2012  . BACK SURGERY  2001   laminectomy  . BREAST BIOPSY Left 2014  . BREAST LUMPECTOMY Left 02/25/2013  . BREAST SURGERY Left 02/25/13   lumpectomy  . CHOLECYSTECTOMY  1980's  . DILATION AND CURETTAGE OF UTERUS  [id number]   "probably 3" (07/08/2012)  . FRACTURE SURGERY     C4-7 Surgery for fracture  . LUMBAR WOUND DEBRIDEMENT N/A 08/12/2012   Procedure: LUMBAR WOUND DEBRIDEMENT;  Surgeon: Faythe Ghee, MD;  Location: [facility];  Service: Neurosurgery;  Laterality: N/A;  Lumbar Wound Debridement  . POSTERIOR LUMBAR FUSION  07/08/2012  . TONSILLECTOMY  ~ 1968    Medical History: Past Medical History:  Diagnosis Date  . Anxiety   . Arthritis    "pretty much from my neck to my feet" (07/08/2012)  . B12 deficiency anemia   . Chronic lower back pain   . Depression   . Diabetic peripheral neuropathy ([place])   . Gout   . High cholesterol   . IDA (iron deficiency anemia) 06/17/2019  . 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 Neuromuscular disorder ([place])   . Rheumatoid arthritis ([place])   . Type II diabetes mellitus (HCC)    recent hgb A1C was 5.4 per Dr. Humphrey Rolls, [place], [place]    Family History: Family History  Problem Relation Age of Onset  . Kidney disease Father   . Heart disease Father   . Diabetes Father   . Hypertension Mother   . Depression Mother   . Cancer Other        breast great grandmother  . Breast cancer Other   . COPD Brother   . Heart failure Brother   . Diabetes Brother   . COPD Brother     Social History   Socioeconomic History  . Marital status: Single    Spouse name: Not on file  . Number of children: Not on file  . Years of education: Not on file  . Highest education level: Not on file  Occupational History  . Occupation: disability  Tobacco Use  . Smoking status: Current Every Day Smoker    Packs/day: 0.75    Years: 45.00    Pack years: 33.75    Types: Cigarettes  . Smokeless tobacco:  Never Used  Vaping Use  . Vaping Use: Never used  Substance and Sexual Activity  . Alcohol use: No  . Drug use: No    Types: Marijuana, Cocaine    Comment: 07/08/2012 "last drug use >8 yr ago" (07/08/2012)  . Sexual activity: Yes    Comment: quit a long time ago  Other Topics Concern  . Not on file  Social History Narrative  . Not on file   Social Determinants of Health   Financial Resource Strain: Not on file  Food Insecurity: Not on file  Transportation Needs: Not on file  Physical  Activity: Not on file  Stress: Not on file  Social Connections: Not on file  Intimate Partner Violence: Not on file      Review of Systems  Constitutional: Negative for chills, diaphoresis and fatigue.  HENT: Negative for ear pain, postnasal drip and sinus pressure.   Eyes: Negative for photophobia, discharge, redness, itching and visual disturbance.  Respiratory: Negative for cough, shortness of breath and wheezing.   Cardiovascular: Negative for chest pain, palpitations and leg swelling.  Gastrointestinal: Negative for abdominal pain, constipation, diarrhea, nausea and vomiting.  Genitourinary: Negative for dysuria and flank pain.  Musculoskeletal: Positive for arthralgias and back pain. Negative for gait problem and neck pain.  Skin: Negative for color change.  Allergic/Immunologic: Negative for environmental allergies and food allergies.  Neurological: Negative for dizziness and headaches.  Hematological: Does not bruise/bleed easily.  Psychiatric/Behavioral: Negative for agitation, behavioral problems (depression) and hallucinations.    Vital Signs: BP 138/76   Pulse 70   Temp (!) 97.1 F (36.2 C)   Resp 16   Ht 5' 9"  (1.753 m)   Wt 221 lb 9.6 oz (100.5 kg)   SpO2 97%   BMI 32.72 kg/m    Physical Exam Constitutional:      General: She is not in acute distress.    Appearance: She is well-developed and well-nourished. She is not diaphoretic.  HENT:     Head: Normocephalic  and atraumatic.     Mouth/Throat:     Mouth: Oropharynx is clear and moist.     Pharynx: No oropharyngeal exudate.  Eyes:     Extraocular Movements: EOM normal.     Pupils: Pupils are equal, round, and reactive to light.  Neck:     Thyroid: No thyromegaly.     Vascular: No JVD.     Trachea: No tracheal deviation.  Cardiovascular:     Rate and Rhythm: Normal rate and regular rhythm.     Heart sounds: Normal heart sounds. No murmur heard. No friction rub. No gallop.   Pulmonary:     Effort: Pulmonary effort is normal. No respiratory distress.     Breath sounds: No wheezing or rales.  Chest:     Chest wall: No tenderness.  Abdominal:     General: Bowel sounds are normal.     Palpations: Abdomen is soft.  Musculoskeletal:        General: Swelling, tenderness, deformity and signs of injury present.     Cervical back: Normal range of motion and neck supple.  Lymphadenopathy:     Cervical: No cervical adenopathy.  Skin:    General: Skin is warm and dry.     Comments: Left foot pain   Neurological:     Mental Status: She is alert and oriented to person, place, and time.     Cranial Nerves: No cranial nerve deficit.  Psychiatric:        Mood and Affect: Mood and affect normal.        Behavior: Behavior normal.        Thought Content: Thought content normal.        Judgment: Judgment normal.     Assessment/Plan: 1. Acute foot pain, left Pt is sent to urgent care for possible xray and treatment  - Ambulatory referral to Orthopedic Surgery  2. Uncontrolled type 2 diabetes mellitus with hyperglycemia (HCC) Continue and refilled Victoza  - liraglutide (VICTOZA) 18 MG/3ML SOPN; Inject  1.8 mg [place]  daily  Dispense: 3 mL; Refill: 12  3. Abdominal aortic atherosclerosis ([place]) Start low dose Crestor   4. Postinfectious hypothyroidism Check Synthroid   5. Hashimoto's disease - TSH + free T4  General Counseling: Marveen verbalizes understanding of the findings of todays visit  and agrees with plan of treatment. I have discussed any further diagnostic evaluation that may be needed or ordered today. We also reviewed her medications today. she has been encouraged to call the office with any questions or concerns that should arise related to todays visit.   Orders Placed This Encounter  Procedures  . Ambulatory referral to Orthopedic Surgery    Meds ordered this encounter  Medications  . liraglutide (VICTOZA) 18 MG/3ML SOPN    Sig: Inject  1.8 mg [place]  daily    Dispense:  3 mL    Refill:  12    Total time spent: 35 Minutes Time spent includes review of chart, medications, test results, and follow up plan with the patient.    Dr Lavera Guise Internal medicine

## 2020-03-04 ENCOUNTER — Other Ambulatory Visit: Payer: Self-pay | Admitting: Internal Medicine

## 2020-03-07 MED ORDER — ROSUVASTATIN CALCIUM 5 MG PO TABS: ORAL_TABLET | ORAL | 3 refills | Status: DC

## 2020-03-21 ENCOUNTER — Telehealth: Payer: Self-pay

## 2020-03-21 ENCOUNTER — Other Ambulatory Visit: Payer: Self-pay

## 2020-03-21 DIAGNOSIS — E039 Hypothyroidism, unspecified: Secondary | ICD-10-CM

## 2020-03-21 MED ORDER — LEVOTHYROXINE SODIUM 75 MCG PO TABS: 75.0000 ug | ORAL_TABLET | Freq: Every day | ORAL | 3 refills | Status: DC

## 2020-03-22 ENCOUNTER — Other Ambulatory Visit: Payer: Self-pay | Admitting: Internal Medicine

## 2020-03-25 NOTE — Telephone Encounter (Signed)
Send message 

## 2020-04-07 ENCOUNTER — Other Ambulatory Visit: Payer: Self-pay

## 2020-04-07 ENCOUNTER — Other Ambulatory Visit: Payer: Self-pay | Admitting: Internal Medicine

## 2020-04-07 ENCOUNTER — Telehealth: Payer: Self-pay

## 2020-04-07 DIAGNOSIS — F410 Panic disorder [episodic paroxysmal anxiety] without agoraphobia: Secondary | ICD-10-CM

## 2020-04-07 DIAGNOSIS — E782 Mixed hyperlipidemia: Secondary | ICD-10-CM

## 2020-04-07 DIAGNOSIS — F411 Generalized anxiety disorder: Secondary | ICD-10-CM

## 2020-04-07 MED ORDER — FLUCONAZOLE 150 MG PO TABS: ORAL_TABLET | ORAL | 0 refills | Status: DC

## 2020-04-07 NOTE — Telephone Encounter (Signed)
PT notified

## 2020-04-07 NOTE — Telephone Encounter (Signed)
I will be able to send Diflucan however Valium cannot be sent without a visit,

## 2020-04-12 ENCOUNTER — Ambulatory Visit: Payer: Medicaid Other | Admitting: Internal Medicine

## 2020-04-15 ENCOUNTER — Other Ambulatory Visit: Payer: Self-pay

## 2020-04-15 MED ORDER — ONDANSETRON HCL 4 MG PO TABS: 4.0000 mg | ORAL_TABLET | Freq: Three times a day (TID) | ORAL | 0 refills | Status: AC | PRN

## 2020-04-25 ENCOUNTER — Other Ambulatory Visit: Payer: Self-pay

## 2020-04-25 DIAGNOSIS — F410 Panic disorder [episodic paroxysmal anxiety] without agoraphobia: Secondary | ICD-10-CM

## 2020-04-25 MED ORDER — DULOXETINE HCL 60 MG PO CPEP: 60.0000 mg | ORAL_CAPSULE | Freq: Two times a day (BID) | ORAL | 3 refills | Status: DC

## 2020-05-05 ENCOUNTER — Other Ambulatory Visit: Payer: Self-pay | Admitting: Internal Medicine

## 2020-05-11 ENCOUNTER — Inpatient Hospital Stay: Payer: Medicaid Other | Attending: Oncology

## 2020-05-11 ENCOUNTER — Other Ambulatory Visit: Payer: Self-pay

## 2020-05-11 ENCOUNTER — Other Ambulatory Visit: Payer: Self-pay | Admitting: Nurse Practitioner

## 2020-05-11 DIAGNOSIS — R5383 Other fatigue: Secondary | ICD-10-CM

## 2020-05-11 DIAGNOSIS — Z720 Tobacco use: Secondary | ICD-10-CM

## 2020-05-11 DIAGNOSIS — E611 Iron deficiency: Secondary | ICD-10-CM

## 2020-05-11 DIAGNOSIS — D509 Iron deficiency anemia, unspecified: Secondary | ICD-10-CM | POA: Insufficient documentation

## 2020-05-11 DIAGNOSIS — Z86000 Personal history of in-situ neoplasm of breast: Secondary | ICD-10-CM

## 2020-05-11 LAB — COMPREHENSIVE METABOLIC PANEL
ALT: 14 U/L (ref 0–44)
AST: 18 U/L (ref 15–41)
Albumin: 3.8 g/dL (ref 3.5–5.0)
Alkaline Phosphatase: 129 U/L — ABNORMAL HIGH (ref 38–126)
Anion gap: 9 (ref 5–15)
BUN: 27 mg/dL — ABNORMAL HIGH (ref 6–20)
CO2: 23 mmol/L (ref 22–32)
Calcium: 9 mg/dL (ref 8.9–10.3)
Chloride: 104 mmol/L (ref 98–111)
Creatinine, Ser: 1.34 mg/dL — ABNORMAL HIGH (ref 0.44–1.00)
GFR, Estimated: 45 mL/min — ABNORMAL LOW (ref >60)
Glucose, Bld: 98 mg/dL (ref 70–99)
Potassium: 3.8 mmol/L (ref 3.5–5.1)
Sodium: 136 mmol/L (ref 135–145)
Total Bilirubin: 0.5 mg/dL (ref 0.3–1.2)
Total Protein: 7.4 g/dL (ref 6.5–8.1)

## 2020-05-11 LAB — CBC WITH DIFFERENTIAL/PLATELET
Abs Immature Granulocytes: 0.02 10*3/uL (ref 0.00–0.07)
Basophils Absolute: 0.1 10*3/uL (ref 0.0–0.1)
Basophils Relative: 1 %
Eosinophils Absolute: 0.3 10*3/uL (ref 0.0–0.5)
Eosinophils Relative: 5 %
HCT: 36.3 % (ref 36.0–46.0)
Hemoglobin: 12.1 g/dL (ref 12.0–15.0)
Immature Granulocytes: 0 %
Lymphocytes Relative: 33 %
Lymphs Abs: 2.1 10*3/uL (ref 0.7–4.0)
MCH: 29.2 pg (ref 26.0–34.0)
MCHC: 33.3 g/dL (ref 30.0–36.0)
MCV: 87.5 fL (ref 80.0–100.0)
Monocytes Absolute: 0.5 10*3/uL (ref 0.1–1.0)
Monocytes Relative: 8 %
Neutro Abs: 3.3 10*3/uL (ref 1.7–7.7)
Neutrophils Relative %: 53 %
Platelets: 239 10*3/uL (ref 150–400)
RBC: 4.15 MIL/uL (ref 3.87–5.11)
RDW: 13.4 % (ref 11.5–15.5)
WBC: 6.3 10*3/uL (ref 4.0–10.5)
nRBC: 0 % (ref 0.0–0.2)

## 2020-05-11 LAB — IRON AND TIBC
Iron: 67 ug/dL (ref 28–170)
Saturation Ratios: 14 % (ref 10.4–31.8)
TIBC: 490 ug/dL — ABNORMAL HIGH (ref 250–450)
UIBC: 423 ug/dL

## 2020-05-11 LAB — FERRITIN: Ferritin: 69 ng/mL (ref 11–307)

## 2020-05-13 ENCOUNTER — Inpatient Hospital Stay (HOSPITAL_BASED_OUTPATIENT_CLINIC_OR_DEPARTMENT_OTHER): Payer: Medicaid Other | Admitting: Oncology

## 2020-05-13 ENCOUNTER — Encounter: Payer: Self-pay | Admitting: Oncology

## 2020-05-13 ENCOUNTER — Inpatient Hospital Stay: Payer: Medicaid Other

## 2020-05-13 VITALS — BP 126/72 | HR 76 | Temp 97.7°F | Resp 16 | Wt 212.5 lb

## 2020-05-13 DIAGNOSIS — Z720 Tobacco use: Secondary | ICD-10-CM | POA: Diagnosis not present

## 2020-05-13 DIAGNOSIS — D509 Iron deficiency anemia, unspecified: Secondary | ICD-10-CM | POA: Diagnosis not present

## 2020-05-13 DIAGNOSIS — E611 Iron deficiency: Secondary | ICD-10-CM | POA: Diagnosis not present

## 2020-05-13 DIAGNOSIS — R5383 Other fatigue: Secondary | ICD-10-CM | POA: Diagnosis not present

## 2020-05-13 DIAGNOSIS — F32A Depression, unspecified: Secondary | ICD-10-CM

## 2020-05-13 NOTE — Progress Notes (Signed)
Patient reports increase in fatigue and can sleep for days.

## 2020-05-13 NOTE — Progress Notes (Signed)
Hematology/Oncology follow up note [facility] [phone] [phone]   Patient Care Team: Ronnell Freshwater, NP as PCP - General (Family Medicine) Lavera Guise, MD (Internal Medicine) Christene Lye, MD (General Surgery)  REFERRING PROVIDER: Ronnell Freshwater, NP CHIEF COMPLAINTS/REASON FOR VISIT:  Follow-up for iron deficiency  HISTORY OF PRESENTING ILLNESS:  Holly Espinoza is a  60 y.o.  female with PMH listed below was seen in consultation at the request of Ronnell Freshwater, NP   for evaluation of iron deficiency anemia.   Reviewed patient's recent labs  06/03/2019 Labs revealed anemia with hemoglobin of 12.7, mcv 82, iron panel showed iron saturation of 9, ferritin 14, TIBC 447.   Associated signs and symptoms: Patient reports fatigue. denies  SOB with exertion.  Denies weight loss, easy bruising, hematochezia, hemoptysis, Holly Espinoza noticed blood when Holly Espinoza wipes after urination. Holly Espinoza has history of bladder prolapse.  Context:   Rectal bleeding: denies.  Menstrual bleeding/ Vaginal bleeding : history of hystectomy.  Hematemesis or hemoptysis : denies  Holly Espinoza has started taking oral iron supplementation ferrous sulfate 369m daily, iron makes her nausea and upset stomach.  Holly Espinoza also has severe constipation and is on Amitiza.   History of LCIS in 2014,s/p lumpectomy.  Holly Espinoza also has breast concerns, PCP has ordered diagnostic mammogram  ISeamanis a 60y.o. female who has above history reviewed by me today presents for follow up visit for iron deficiency Problems and complaints are listed below: Patient is accompanied by her daughter.  Continues to feel fatigued.  Daughter reports that patient sleeps for  3 to 4 days.  Patient has depression and do not feel the symptoms being controlled by her antidepressant. [place].   Review  of Systems  Constitutional: Positive for fatigue. Negative for appetite change, chills and fever.  HENT:   Negative for hearing loss and voice change.   Eyes: Negative for eye problems.  Respiratory: Negative for chest tightness and cough.   Cardiovascular: Negative for chest pain.  Gastrointestinal: Negative for abdominal distention, abdominal pain, blood in stool and constipation.  Endocrine: Negative for hot flashes.  Genitourinary: Negative for difficulty urinating and frequency.         History of bladder prolapse.   Musculoskeletal: Negative for arthralgias and back pain.  Skin: Negative for itching and rash.  Neurological: Negative for extremity weakness.  Hematological: Negative for adenopathy.  Psychiatric/Behavioral: Negative for confusion.    MEDICAL HISTORY:  Past Medical History:  Diagnosis Date  . Anxiety   . Arthritis    "pretty much from my neck to my feet" (07/08/2012)  . B12 deficiency anemia   . Chronic lower back pain   . Depression   . Diabetic peripheral neuropathy ([place]   . Gout   . High cholesterol   . IDA (iron deficiency anemia) 06/17/2019  . 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 Neuromuscular disorder ([place]   . Rheumatoid arthritis ([place]   . Type II diabetes mellitus (HCC)    recent hgb A1C was 5.4 per Dr. [place]phrey Rolls [place]   SURGICAL HISTORY: Past Surgical History:  Procedure Laterality Date  . ABDOMINAL HYSTERECTOMY  2001?  . ANTERIOR AND POSTERIOR REPAIR N/A 11/01/2015   Procedure: CYSTOSCOPY, ANTERIOR (CYSTOCELE) AND POSTERIOR REPAIR (RECTOCELE);  Surgeon: SBjorn Loser MD;  Location: [facility];  Service: Urology;  Laterality: N/A;  . ANTERIOR CERVICAL DECOMP/DISCECTOMY FUSION  2012  . BACK SURGERY  2001   laminectomy  . BREAST BIOPSY Left 2014  . BREAST LUMPECTOMY Left 02/25/2013  . BREAST SURGERY Left 02/25/13   lumpectomy  . CHOLECYSTECTOMY  1980's  . DILATION AND CURETTAGE OF UTERUS  [phone]    "probably 3" (07/08/2012)  . FRACTURE SURGERY     C4-7 Surgery for fracture  . LUMBAR WOUND DEBRIDEMENT N/A 08/12/2012   Procedure: LUMBAR WOUND DEBRIDEMENT;  Surgeon: Faythe Ghee, MD;  Location: [place] [facility];  Service: Neurosurgery;  Laterality: N/A;  Lumbar Wound Debridement  . POSTERIOR LUMBAR FUSION  07/08/2012  . TONSILLECTOMY  ~ 1968    SOCIAL HISTORY: Social History   Socioeconomic History  . Marital status: Single    Spouse name: Not on file  . Number of children: Not on file  . Years of education: Not on file  . Highest education level: Not on file  Occupational History  . Occupation: disability  Tobacco Use  . Smoking status: Current Every Day Smoker    Packs/day: 0.75    Years: 45.00    Pack years: 33.75    Types: Cigarettes  . Smokeless tobacco: Never Used  Vaping Use  . Vaping Use: Never used  Substance and Sexual Activity  . Alcohol use: No  . Drug use: No    Types: Marijuana, Cocaine    Comment: 07/08/2012 "last drug use >8 yr ago" (07/08/2012)  . Sexual activity: Yes    Comment: quit a long time ago  Other Topics Concern  . Not on file  Social History Narrative  . Not on file   Social Determinants of Health   Financial Resource Strain: Not on file  Food Insecurity: Not on file  Transportation Needs: Not on file  Physical Activity: Not on file  Stress: Not on file  Social Connections: Not on file  Intimate Partner Violence: Not on file    FAMILY HISTORY: Family History  Problem Relation Age of Onset  . Kidney disease Father   . Heart disease Father   . Diabetes Father   . Hypertension Mother   . Depression Mother   . Cancer Other        breast great grandmother  . Breast cancer Other   . COPD Brother   . Heart failure Brother   . Diabetes Brother   . COPD Brother     ALLERGIES:  has No Known Allergies.  MEDICATIONS:  Current Outpatient Medications  Medication Sig Dispense Refill  . allopurinol (ZYLOPRIM) 100 MG tablet Take 2 tab  po at bedtime 180 tablet 3  . AMITIZA 24 MCG capsule Take 24 mcg by mouth 2 (two) times daily.    Marland Kitchen aspirin 81 MG chewable tablet Chew 81 mg by mouth daily.    . bisoprolol-hydrochlorothiazide (ZIAC) 5-6.25 MG tablet TAKE 1 TABLET BY MOUTH EVERY DAY FOR BLOOD PRESSURE 90 tablet 1  . cyanocobalamin (,VITAMIN B-12,) 1000 MCG/ML injection Inject 1 mL (1,000 mcg total) into the muscle every 30 (thirty) days. 1 mL 3  . diazepam (VALIUM) 5 MG tablet Take 1/2 to 1 tablet po BID PRN only. 45 tablet 2  . DULoxetine (CYMBALTA) 60 MG capsule Take 1 capsule (60 mg total) by mouth 2 (two) times daily. 60 capsule 3  . hydrochlorothiazide (HYDRODIURIL) 25 MG tablet Take 1 tablet (25 mg total) by mouth daily. 30 tablet 3  . Insulin Pen Needle (PEN NEEDLES  5/16") 30G X 8 MM MISC To use every day with victoza injections - E11.65 30 each 5  . levothyroxine (SYNTHROID) 75 MCG tablet Take 1 tablet (75 mcg total) by mouth daily before breakfast. 30 tablet 3  . liraglutide (VICTOZA) 18 MG/3ML SOPN Inject  1.8 mg [place]  daily 3 mL 12  . meloxicam (MOBIC) 7.5 MG tablet Take 1 tablet (7.5 mg total) by mouth 2 (two) times daily as needed for pain. 60 tablet 3  . omeprazole (PRILOSEC) 40 MG capsule Take 1 capsule (40 mg total) by mouth daily. 30 capsule 3  . ondansetron (ZOFRAN) 4 MG tablet Take 1 tablet (4 mg total) by mouth every 8 (eight) hours as needed for nausea. For nausea 20 tablet 0  . oxyCODONE-acetaminophen (PERCOCET) 10-325 MG tablet Take 1 tablet by mouth every 8 (eight) hours.    . pregabalin (LYRICA) 50 MG capsule Take 50 mg by mouth 2 (two) times daily.    . rosuvastatin (CRESTOR) 5 MG tablet Take one tab every other day for cholesterol 45 tablet 3  . SYMBICORT 80-4.5 MCG/ACT inhaler INHALE 2 PUFFS INTO THE LUNGS TWICE DAILY 10.2 g 3  . topiramate (TOPAMAX) 100 MG tablet Take 100 mg by mouth 2 (two) times daily.    Marland Kitchen XTAMPZA ER 9 MG C12A Take 1 capsule by mouth 2 (two) times daily.    Marland Kitchen azithromycin (ZITHROMAX)  250 MG tablet Take one tab a day for 10 days for uri (Patient not taking: No sig reported) 10 tablet 0  . Colchicine 0.6 MG CAPS Take 1.2 mg by mouth See admin instructions. At onset of gout flare. May repeat one additional capsule 1 hour later if symptoms persist. (Patient not taking: Reported on 05/13/2020)    . Cyanocobalamin (B-12) 1000 MCG/ML KIT Inject 1,000 mcg as directed every 30 (thirty) days. 1 kit 3  . fluconazole (DIFLUCAN) 150 MG tablet Take one tab po qd x 3 days (Patient not taking: Reported on 05/13/2020) 3 tablet 0  . OXYCONTIN 10 MG 12 hr tablet Take 10 mg by mouth every 12 (twelve) hours.  (Patient not taking: Reported on 05/13/2020)     No current facility-administered medications for this visit.     PHYSICAL EXAMINATION: ECOG PERFORMANCE STATUS: 1 - Symptomatic but completely ambulatory Vitals:   05/13/20 1322  BP: 126/72  Pulse: 76  Resp: 16  Temp: 97.7 F (36.5 C)   Filed Weights   05/13/20 1322  Weight: 212 lb 8 oz (96.4 kg)    Physical Exam Constitutional:      General: Holly Espinoza is not in acute distress. HENT:     Head: Normocephalic and atraumatic.  Eyes:     General: No scleral icterus. Cardiovascular:     Rate and Rhythm: Normal rate and regular rhythm.     Heart sounds: Normal heart sounds.  Pulmonary:     Effort: Pulmonary effort is normal. No respiratory distress.     Breath sounds: No wheezing.  Abdominal:     General: Bowel sounds are normal. There is no distension.     Palpations: Abdomen is soft.  Musculoskeletal:        General: No deformity. Normal range of motion.     Cervical back: Normal range of motion and neck supple.  Skin:    General: Skin is warm and dry.     Findings: No erythema or rash.  Neurological:     Mental Status: Holly Espinoza is alert and oriented to person, place, and  time. Mental status is at baseline.     Cranial Nerves: No cranial nerve deficit.     Coordination: Coordination normal.  Psychiatric:        Mood and Affect: Mood  normal.       CMP Latest Ref Rng & Units 05/11/2020  Glucose 70 - 99 mg/dL 98  BUN 6 - 20 mg/dL 27(H)  Creatinine 0.44 - 1.00 mg/dL 1.34(H)  Sodium 135 - 145 mmol/L 136  Potassium 3.5 - 5.1 mmol/L 3.8  Chloride 98 - 111 mmol/L 104  CO2 22 - 32 mmol/L 23  Calcium 8.9 - 10.3 mg/dL 9.0  Total Protein 6.5 - 8.1 g/dL 7.4  Total Bilirubin 0.3 - 1.2 mg/dL 0.5  Alkaline Phos 38 - 126 U/L 129(H)  AST 15 - 41 U/L 18  ALT 0 - 44 U/L 14   CBC Latest Ref Rng & Units 05/11/2020  WBC 4.0 - 10.5 K/uL 6.3  Hemoglobin 12.0 - 15.0 g/dL 12.1  Hematocrit 36.0 - 46.0 % 36.3  Platelets 150 - 400 K/uL 239     LABORATORY DATA:  I have reviewed the data as listed Lab Results  Component Value Date   WBC 6.3 05/11/2020   HGB 12.1 05/11/2020   HCT 36.3 05/11/2020   MCV 87.5 05/11/2020   PLT 239 05/11/2020   Recent Labs    06/03/19 1147 07/15/19 1840 02/12/20 1359 05/11/20 1259  NA 141 135 139 136  K 3.9 3.5 3.6 3.8  CL 106 102 105 104  CO2 _0 GLUCOSE 114* 105* 162* 98  BUN 16 25* 15 27*  CREATININE 1.19* 1.27* 0.94 1.34*  CALCIUM 9.5 9.4 9.2 9.0  GFRNONAA 50* 46* 66 45*  GFRAA 58* 53* 76  --   PROT 6.8  --  6.9 7.4  ALBUMIN 4.2  --  4.3 3.8  AST 25  --  27 18  ALT 14  --  22 14  ALKPHOS 166*  --  157* 129*  BILITOT 0.3  --  0.4 0.5   Iron/TIBC/Ferritin/ %Sat    Component Value Date/Time   IRON 67 05/11/2020 1259   IRON 40 06/03/2019 1147   IRON 75 01/15/2013 1415   TIBC 490 (H) 05/11/2020 1259   TIBC 447 06/03/2019 1147   TIBC 336 01/15/2013 1415   FERRITIN 69 05/11/2020 1259   FERRITIN 14 (L) 06/03/2019 1147   FERRITIN 255 01/15/2013 1415   IRONPCTSAT 14 05/11/2020 1259   IRONPCTSAT 9 (LL) 06/03/2019 1147   IRONPCTSAT 22 01/15/2013 1415     RADIOGRAPHIC STUDIES: I have personally reviewed the radiological images as listed and agreed with the findings in the report. No results found.     ASSESSMENT & PLAN:  1. Iron deficiency   2. Other fatigue   3.  Tobacco abuse   4. Depression, unspecified depression type    #Iron deficiency,  No anemia.  Labs are reviewed and are discussed with patient .  Iron level has improved.  Hemoglobin remained stable and normal.   #Chronic severe fatigue, discussed with patient that I recommend patient to get sleep study done. Also the fatigue level is not proportionate to her iron level and normal hemoglobin.  I suspect that the fatigue is probably secondary to depression symptoms. Holly Espinoza is in the process of getting care from psychiatrist in [place].   Tobacco use, Up-to-date for lung cancer screening   History of LCIS. mammogram surveillance was independent reviewed by me discussed with patient.  Holly Espinoza is due for mammogram in April 2022.  Patient gets mammogram ordered through her primary care provider's office.   All questions were answered.   Cc Ronnell Freshwater, NP  Return of visit: Patient is doing well from hematology aspect.  Since Holly Espinoza is relocating to [facility], I will discharge her from our clinic.  Continue follow-up with primary care provider.   Earlie Server, MD, PhD Hematology Oncology [place] at [facility] 05/13/2020

## 2020-05-23 ENCOUNTER — Other Ambulatory Visit: Payer: Self-pay | Admitting: Nurse Practitioner

## 2020-05-31 ENCOUNTER — Other Ambulatory Visit: Payer: Self-pay | Admitting: Hospice and Palliative Medicine

## 2020-05-31 ENCOUNTER — Other Ambulatory Visit: Payer: Self-pay

## 2020-05-31 ENCOUNTER — Ambulatory Visit: Payer: Medicaid Other | Admitting: Hospice and Palliative Medicine

## 2020-05-31 ENCOUNTER — Encounter: Payer: Self-pay | Admitting: Hospice and Palliative Medicine

## 2020-05-31 VITALS — BP 104/70 | HR 70 | Temp 97.3°F | Resp 16 | Ht 68.0 in | Wt 221.2 lb

## 2020-05-31 DIAGNOSIS — G4719 Other hypersomnia: Secondary | ICD-10-CM

## 2020-05-31 DIAGNOSIS — R5383 Other fatigue: Secondary | ICD-10-CM

## 2020-05-31 DIAGNOSIS — F339 Major depressive disorder, recurrent, unspecified: Secondary | ICD-10-CM

## 2020-05-31 DIAGNOSIS — G8929 Other chronic pain: Secondary | ICD-10-CM

## 2020-05-31 DIAGNOSIS — Z79899 Other long term (current) drug therapy: Secondary | ICD-10-CM

## 2020-05-31 DIAGNOSIS — E782 Mixed hyperlipidemia: Secondary | ICD-10-CM

## 2020-05-31 DIAGNOSIS — M542 Cervicalgia: Secondary | ICD-10-CM

## 2020-05-31 DIAGNOSIS — Z1231 Encounter for screening mammogram for malignant neoplasm of breast: Secondary | ICD-10-CM

## 2020-05-31 DIAGNOSIS — M79662 Pain in left lower leg: Secondary | ICD-10-CM

## 2020-05-31 DIAGNOSIS — E1165 Type 2 diabetes mellitus with hyperglycemia: Secondary | ICD-10-CM

## 2020-05-31 DIAGNOSIS — M25512 Pain in left shoulder: Secondary | ICD-10-CM

## 2020-05-31 DIAGNOSIS — Z0001 Encounter for general adult medical examination with abnormal findings: Secondary | ICD-10-CM

## 2020-05-31 DIAGNOSIS — R300 Dysuria: Secondary | ICD-10-CM

## 2020-05-31 DIAGNOSIS — M25511 Pain in right shoulder: Secondary | ICD-10-CM

## 2020-05-31 DIAGNOSIS — I10 Essential (primary) hypertension: Secondary | ICD-10-CM

## 2020-05-31 DIAGNOSIS — E538 Deficiency of other specified B group vitamins: Secondary | ICD-10-CM

## 2020-05-31 LAB — POCT GLYCOSYLATED HEMOGLOBIN (HGB A1C): Hemoglobin A1C: 6.2 % — AB (ref 4.0–5.6)

## 2020-05-31 MED ORDER — MELOXICAM 7.5 MG PO TABS
7.5000 mg | ORAL_TABLET | Freq: Two times a day (BID) | ORAL | 3 refills | Status: DC | PRN
Start: 2020-05-31 — End: 2020-05-31

## 2020-05-31 MED ORDER — DIAZEPAM 5 MG PO TABS: 5.0000 mg | ORAL_TABLET | Freq: Every day | ORAL | 0 refills | Status: AC | PRN

## 2020-05-31 MED ORDER — CYANOCOBALAMIN 1000 MCG/ML IJ SOLN: 1000.0000 ug | INTRAMUSCULAR | 0 refills | Status: DC

## 2020-05-31 MED ORDER — HYDROCHLOROTHIAZIDE 25 MG PO TABS: 25.0000 mg | ORAL_TABLET | Freq: Every day | ORAL | 3 refills | Status: DC

## 2020-05-31 MED ORDER — BUPROPION HCL ER (XL) 150 MG PO TB24: 150.0000 mg | ORAL_TABLET | Freq: Every day | ORAL | 0 refills | Status: DC

## 2020-05-31 MED ORDER — MELOXICAM 15 MG PO TABS: 15.0000 mg | ORAL_TABLET | Freq: Every day | ORAL | 3 refills | Status: AC

## 2020-05-31 MED ORDER — ROSUVASTATIN CALCIUM 5 MG PO TABS: ORAL_TABLET | ORAL | 3 refills | Status: AC

## 2020-05-31 NOTE — Progress Notes (Signed)
[facility] [place], [place] [address]  Internal MEDICINE  Office Visit Note  Patient Name: Holly Espinoza  [id number]  [id number]  Date of Service: 06/01/2020  Chief Complaint  Patient presents with  . Annual Exam    Discuss meds, depression, mammogram, refills, sleep study  . Depression  . Diabetes  . Hyperlipidemia  . Anxiety  . Anemia     HPI Pt is here for routine health maintenance examination Multiple chronic conditions and followed by many specialists High risk for polypharmacy  Continues to have worsening back pain--followed and managed by pain management Requesting refills of diazepam--complaining of worsening depression--keeping her from being able to function from day to day--will stay in bed for days at a time  Sleep study ordered last year--never completed, explained due to prolonged use and increased dosing of narcotic pain medications she is at risk of CSA-willing to go forward with HST  Due for screening mammogram Colonoscopy will need repeating in 2023   Current Medication: Outpatient Encounter Medications as of 05/31/2020  Medication Sig  . buPROPion (WELLBUTRIN XL) 150 MG 24 hr tablet Take 1 tablet (150 mg total) by mouth daily.  . cyanocobalamin (,VITAMIN B-12,) 1000 MCG/ML injection Inject 1 mL (1,000 mcg total) into the muscle every 30 (thirty) days.  . diazepam (VALIUM) 5 MG tablet Take 1 tablet (5 mg total) by mouth daily as needed for anxiety.  . meloxicam (MOBIC) 15 MG tablet Take 1 tablet (15 mg total) by mouth daily.  Marland Kitchen allopurinol (ZYLOPRIM) 100 MG tablet Take 2 tab po at bedtime  . AMITIZA 24 MCG capsule Take 24 mcg by mouth 2 (two) times daily.  Marland Kitchen aspirin 81 MG chewable tablet Chew 81 mg by mouth daily.  . bisoprolol-hydrochlorothiazide (ZIAC) 5-6.25 MG tablet TAKE 1 TABLET BY MOUTH EVERY DAY FOR BLOOD PRESSURE  . DULoxetine (CYMBALTA) 60 MG capsule Take 1 capsule (60 mg total) by mouth 2 (two) times daily.  .  hydrochlorothiazide (HYDRODIURIL) 25 MG tablet Take 1 tablet (25 mg total) by mouth daily.  . Insulin Pen Needle (PEN NEEDLES 5/16") 30G X 8 MM MISC To use every day with victoza injections - E11.65  . levothyroxine (SYNTHROID) 75 MCG tablet Take 1 tablet (75 mcg total) by mouth daily before breakfast.  . liraglutide (VICTOZA) 18 MG/3ML SOPN Inject  1.8 mg [place]  daily  . omeprazole (PRILOSEC) 40 MG capsule Take 1 capsule (40 mg total) by mouth daily.  . ondansetron (ZOFRAN) 4 MG tablet Take 1 tablet (4 mg total) by mouth every 8 (eight) hours as needed for nausea. For nausea  . oxyCODONE-acetaminophen (PERCOCET) 10-325 MG tablet Take 1 tablet by mouth every 8 (eight) hours.  . pregabalin (LYRICA) 50 MG capsule Take 50 mg by mouth 2 (two) times daily.  . rosuvastatin (CRESTOR) 5 MG tablet Take one tab every other day for cholesterol  . SYMBICORT 80-4.5 MCG/ACT inhaler INHALE 2 PUFFS INTO THE LUNGS TWICE DAILY  . topiramate (TOPAMAX) 100 MG tablet Take 100 mg by mouth 2 (two) times daily.  Marland Kitchen XTAMPZA ER 9 MG C12A Take 1 capsule by mouth 2 (two) times daily.  . [DISCONTINUED] Colchicine 0.6 MG CAPS Take 1.2 mg by mouth See admin instructions. At onset of gout flare. May repeat one additional capsule 1 hour later if symptoms persist. (Patient not taking: Reported on 05/13/2020)  . [DISCONTINUED] cyanocobalamin (,VITAMIN B-12,) 1000 MCG/ML injection Inject 1 mL (1,000 mcg total) into the muscle every 30 (thirty)  days.  . [DISCONTINUED] Cyanocobalamin (B-12) 1000 MCG/ML KIT Inject 1,000 mcg as directed every 30 (thirty) days.  . [DISCONTINUED] diazepam (VALIUM) 5 MG tablet Take 1/2 to 1 tablet po BID PRN only.  . [DISCONTINUED] fluconazole (DIFLUCAN) 150 MG tablet Take one tab po qd x 3 days (Patient not taking: Reported on 05/13/2020)  . [DISCONTINUED] hydrochlorothiazide (HYDRODIURIL) 25 MG tablet Take 1 tablet (25 mg total) by mouth daily.  . [DISCONTINUED] meloxicam (MOBIC) 7.5 MG tablet Take 1 tablet (7.5  mg total) by mouth 2 (two) times daily as needed for pain.  . [DISCONTINUED] meloxicam (MOBIC) 7.5 MG tablet Take 1 tablet (7.5 mg total) by mouth 2 (two) times daily as needed for pain.  . [DISCONTINUED] OXYCONTIN 10 MG 12 hr tablet Take 10 mg by mouth every 12 (twelve) hours.  (Patient not taking: Reported on 05/13/2020)  . [DISCONTINUED] rosuvastatin (CRESTOR) 5 MG tablet Take one tab every other day for cholesterol   No facility-administered encounter medications on file as of 05/31/2020.    Surgical History: Past Surgical History:  Procedure Laterality Date  . ABDOMINAL HYSTERECTOMY  2001?  . ANTERIOR AND POSTERIOR REPAIR N/A 11/01/2015   Procedure: CYSTOSCOPY, ANTERIOR (CYSTOCELE) AND POSTERIOR REPAIR (RECTOCELE);  Surgeon: Bjorn Loser, MD;  Location: [facility];  Service: Urology;  Laterality: N/A;  . ANTERIOR CERVICAL DECOMP/DISCECTOMY FUSION  2012  . BACK SURGERY  2001   laminectomy  . BREAST BIOPSY Left 2014  . BREAST LUMPECTOMY Left 02/25/2013  . BREAST SURGERY Left 02/25/13   lumpectomy  . CHOLECYSTECTOMY  1980's  . DILATION AND CURETTAGE OF UTERUS  [phone]   "probably 3" (07/08/2012)  . FRACTURE SURGERY     C4-7 Surgery for fracture  . LUMBAR WOUND DEBRIDEMENT N/A 08/12/2012   Procedure: LUMBAR WOUND DEBRIDEMENT;  Surgeon: Faythe Ghee, MD;  Location: [facility];  Service: Neurosurgery;  Laterality: N/A;  Lumbar Wound Debridement  . POSTERIOR LUMBAR FUSION  07/08/2012  . TONSILLECTOMY  ~ 1968    Medical History: Past Medical History:  Diagnosis Date  . Anxiety   . Arthritis    "pretty much from my neck to my feet" (07/08/2012)  . B12 deficiency anemia   . Chronic lower back pain   . Depression   . Diabetic peripheral neuropathy ([place])   . Gout   . High cholesterol   . IDA (iron deficiency anemia) 06/17/2019  . 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 Neuromuscular disorder ([place])   . Rheumatoid arthritis ([place])   . Type II diabetes  mellitus (HCC)    recent hgb A1C was 5.4 per Dr. Humphrey Rolls, [place], [place]    Family History: Family History  Problem Relation Age of Onset  . Kidney disease Father   . Heart disease Father   . Diabetes Father   . Hypertension Mother   . Depression Mother   . Cancer Other        breast great grandmother  . Breast cancer Other   . COPD Brother   . Heart failure Brother   . Diabetes Brother   . COPD Brother       Review of Systems  Constitutional: Negative for chills, diaphoresis and fatigue.  HENT: Negative for ear pain, postnasal drip and sinus pressure.   Eyes: Negative for photophobia, discharge, redness, itching and visual disturbance.  Respiratory: Negative for cough, shortness of breath and wheezing.   Cardiovascular: Negative for chest pain, palpitations and leg swelling.  Gastrointestinal: Negative  for abdominal pain, constipation, diarrhea, nausea and vomiting.  Genitourinary: Negative for dysuria and flank pain.  Musculoskeletal: Positive for arthralgias, back pain and gait problem. Negative for neck pain.  Skin: Negative for color change.  Allergic/Immunologic: Negative for environmental allergies and food allergies.  Neurological: Negative for dizziness and headaches.  Hematological: Does not bruise/bleed easily.  Psychiatric/Behavioral: Positive for behavioral problems (depression) and sleep disturbance. Negative for agitation and hallucinations.     Vital Signs: BP 104/70   Pulse 70   Temp (!) 97.3 F (36.3 C)   Resp 16   Ht 5' 8"  (1.727 m)   Wt 221 lb 3.2 oz (100.3 kg)   SpO2 98%   BMI 33.63 kg/m    Physical Exam Vitals reviewed.  Constitutional:      Appearance: Normal appearance. She is normal weight.  Cardiovascular:     Rate and Rhythm: Normal rate and regular rhythm.     Pulses: Normal pulses.     Heart sounds: Normal heart sounds.  Pulmonary:     Effort: Pulmonary effort is normal.     Breath sounds: Normal breath sounds.  Chest:   Breasts:     Right: Normal.     Left: Normal.    Abdominal:     General: Abdomen is flat.     Palpations: Abdomen is soft.  Musculoskeletal:        General: Normal range of motion.     Cervical back: Normal range of motion.  Skin:    General: Skin is warm.  Neurological:     General: No focal deficit present.     Mental Status: She is alert and oriented to person, place, and time. Mental status is at baseline.  Psychiatric:        Mood and Affect: Mood normal.        Behavior: Behavior normal.        Thought Content: Thought content normal.        Judgment: Judgment normal.      LABS: Recent Results (from the past 2160 hour(s))  Comprehensive metabolic panel     Status: Abnormal   Collection Time: 05/11/20 12:59 PM  Result Value Ref Range   Sodium 136 135 - 145 mmol/L   Potassium 3.8 3.5 - 5.1 mmol/L   Chloride 104 98 - 111 mmol/L   CO2 23 22 - 32 mmol/L   Glucose, Bld 98 70 - 99 mg/dL    Comment: Glucose reference range applies only to samples taken after fasting for at least 8 hours.   BUN 27 (H) 6 - 20 mg/dL   Creatinine, Ser 1.34 (H) 0.44 - 1.00 mg/dL   Calcium 9.0 8.9 - 10.3 mg/dL   Total Protein 7.4 6.5 - 8.1 g/dL   Albumin 3.8 3.5 - 5.0 g/dL   AST 18 15 - 41 U/L   ALT 14 0 - 44 U/L   Alkaline Phosphatase 129 (H) 38 - 126 U/L   Total Bilirubin 0.5 0.3 - 1.2 mg/dL   GFR, Estimated 45 (L) >60 mL/min    Comment: (NOTE) Calculated using the CKD-EPI Creatinine Equation (2021)    Anion gap 9 5 - 15    Comment: Performed at [facility] [place], [place] [address]  Iron and TIBC     Status: Abnormal   Collection Time: 05/11/20 12:59 PM  Result Value Ref Range   Iron 67 28 - 170 ug/dL   TIBC 490 (H) 250 - 450 ug/dL  Saturation Ratios 14 10.4 - 31.8 %   UIBC 423 ug/dL    Comment: Performed at [facility], [address] [place], [place] [address]  Ferritin     Status: None   Collection Time: 05/11/20 12:59 PM  Result  Value Ref Range   Ferritin 69 11 - 307 ng/mL    Comment: Performed at [facility], [address] [place], [place] [address]  CBC with Differential/Platelet     Status: None   Collection Time: 05/11/20 12:59 PM  Result Value Ref Range   WBC 6.3 4.0 - 10.5 K/uL   RBC 4.15 3.87 - 5.11 MIL/uL   Hemoglobin 12.1 12.0 - 15.0 g/dL   HCT 36.3 36.0 - 46.0 %   MCV 87.5 80.0 - 100.0 fL   MCH 29.2 26.0 - 34.0 pg   MCHC 33.3 30.0 - 36.0 g/dL   RDW 13.4 11.5 - 15.5 %   Platelets 239 150 - 400 K/uL   nRBC 0.0 0.0 - 0.2 %   Neutrophils Relative % 53 %   Neutro Abs 3.3 1.7 - 7.7 K/uL   Lymphocytes Relative 33 %   Lymphs Abs 2.1 0.7 - 4.0 K/uL   Monocytes Relative 8 %   Monocytes Absolute 0.5 0.1 - 1.0 K/uL   Eosinophils Relative 5 %   Eosinophils Absolute 0.3 0.0 - 0.5 K/uL   Basophils Relative 1 %   Basophils Absolute 0.1 0.0 - 0.1 K/uL   Immature Granulocytes 0 %   Abs Immature Granulocytes 0.02 0.00 - 0.07 K/uL    Comment: Performed at [facility], [place], [place] [address]  POCT HgB A1C     Status: Abnormal   Collection Time: 05/31/20 12:27 PM  Result Value Ref Range   Hemoglobin A1C 6.2 (A) 4.0 - 5.6 %   HbA1c POC (<> result, manual entry)     HbA1c, POC (prediabetic range)     HbA1c, POC (controlled diabetic range)    TSH + free T4     Status: None   Collection Time: 05/31/20  4:07 PM  Result Value Ref Range   TSH 0.569 0.450 - 4.500 uIU/mL   Free T4 0.91 0.82 - 1.77 ng/dL  B12     Status: None   Collection Time: 05/31/20  4:07 PM  Result Value Ref Range   Vitamin B-12 367 232 - 1,245 pg/mL  Vitamin D (25 hydroxy)     Status: Abnormal   Collection Time: 05/31/20  4:07 PM  Result Value Ref Range   Vit D, 25-Hydroxy 9.7 (L) 30.0 - 100.0 ng/mL    Comment: Vitamin D deficiency has been defined by the Institute of Medicine and an Endocrine Society practice guideline as a level of serum 25-OH vitamin D less than 20 ng/mL (1,2). The Endocrine  Society went on to further define vitamin D insufficiency as a level between 21 and 29 ng/mL (2). 1. IOM (Institute of Medicine). 2010. Dietary reference    intakes for calcium and D. [place]: The    [employer]. 2. Holick MF, Binkley [place], Bischoff-Ferrari HA, et al.    Evaluation, treatment, and prevention of vitamin D    deficiency: an Endocrine Society clinical practice    guideline. JCEM. 2011 Jul; 96(7):1911-30.   UA/M w/rflx Culture, Routine     Status: Abnormal (Preliminary result)   Collection Time: 05/31/20  4:12 PM   Specimen: Urine  Result Value Ref Range   Specific Gravity, UA  1.012 1.005 - 1.030   pH, UA 6.0 5.0 - 7.5   Color, UA Yellow Yellow   Appearance Ur Clear Clear   Leukocytes,UA Trace (A) Negative   Protein,UA Negative Negative/Trace   Glucose, UA Negative Negative   Ketones, UA Negative Negative   RBC, UA Negative Negative   Bilirubin, UA Negative Negative   Urobilinogen, Ur 0.2 0.2 - 1.0 mg/dL   Nitrite, UA Negative Negative   Microscopic Examination See below:     Comment: Microscopic was indicated and was performed.   Urinalysis Reflex Comment     Comment: This specimen has reflexed to a Urine Culture.  Microscopic Examination     Status: None   Collection Time: 05/31/20  4:12 PM  Result Value Ref Range   WBC, UA 0-5 0 - 5 /hpf   RBC 0-2 0 - 2 /hpf   Epithelial Cells (non renal) 0-10 0 - 10 /hpf   Casts None seen None seen /lpf   Bacteria, UA None seen None seen/Few  Urine Culture, Reflex     Status: None (Preliminary result)   Collection Time: 05/31/20  4:12 PM  Result Value Ref Range   Urine Culture, Routine WILL FOLLOW    Assessment/Plan: 1. Encounter for routine adult health examination with abnormal findings Well appearing 60 year old female Up date further routine labs and adjust plan accordingly Orders placed to update PHM - TSH + free T4 - B12 - Vitamin D (25 hydroxy)  2. Encounter for screening mammogram for  malignant neoplasm of breast - MM Digital Screening; Future  3. Uncontrolled type 2 diabetes mellitus with hyperglycemia (HCC) A1C 6.2--continue with current with current management - POCT HgB A1C  4. Essential hypertension BP and HR stable, monitor for hypotension - hydrochlorothiazide (HYDRODIURIL) 25 MG tablet; Take 1 tablet (25 mg total) by mouth daily.  Dispense: 30 tablet; Refill: 3  5. Depression, recurrent ([place]) Add Wellbutrin for further symptom control Advised to wean herself off of Valium as further refills will not be prescribed due to polypharmacy and long term use of opioid medications [place] Controlled Substance Database was reviewed by me for overdose risk score (ORS) Reviewed risks and possible side effects associated with taking opiates, benzodiazepines and other CNS depressants. Combination of these could cause dizziness and drowsiness. Advised patient not to drive or operate machinery when taking these medications, as patient's and other's life can be at risk and will have consequences. Patient verbalized understanding in this matter. Dependence and abuse for these drugs will be monitored closely. A Controlled substance policy and procedure is on file which allows [facility] to order a urine drug screen test at any visit. Patient understands and agrees with the plan - buPROPion (WELLBUTRIN XL) 150 MG 24 hr tablet; Take 1 tablet (150 mg total) by mouth daily.  Dispense: 90 tablet; Refill: 0 - diazepam (VALIUM) 5 MG tablet; Take 1 tablet (5 mg total) by mouth daily as needed for anxiety.  Dispense: 10 tablet; Refill: 0  6. Vitamin B12 deficiency - cyanocobalamin (,VITAMIN B-12,) 1000 MCG/ML injection; Inject 1 mL (1,000 mcg total) into the muscle every 30 (thirty) days.  Dispense: 10 mL; Refill: 0  7. Excessive daytime sleepiness Sleeping for days at a time--possibly linked to uncontrolled depression Increased risk of CSA due to use of opioid pain medications -  Home sleep test  8. Mixed hyperlipidemia Start low dose statin therapy - rosuvastatin (CRESTOR) 5 MG tablet; Take one tab every other day for cholesterol  Dispense: 45 tablet; Refill: 3  9. Cervicalgia Requesting refills--followed by pain management - meloxicam (MOBIC) 15 MG tablet; Take 1 tablet (15 mg total) by mouth daily.  Dispense: 30 tablet; Refill: 3  10. Chronic pain of both shoulders Requesting refills of Meloxicam--followed by pain management - meloxicam (MOBIC) 15 MG tablet; Take 1 tablet (15 mg total) by mouth daily.  Dispense: 30 tablet; Refill: 3  11. Other fatigue - TSH + free T4 - B12 - Vitamin D (25 hydroxy)  12. High risk medication use Chronic use of narcotic pain medications at risk of CSA - Home sleep test  13. Dysuria - UA/M w/rflx Culture, Routine - Microscopic Examination - Urine Culture, Reflex  General Counseling: Janelis verbalizes understanding of the findings of todays visit and agrees with plan of treatment. I have discussed any further diagnostic evaluation that may be needed or ordered today. We also reviewed her medications today. she has been encouraged to call the office with any questions or concerns that should arise related to todays visit.    Counseling:    Orders Placed This Encounter  Procedures  . Microscopic Examination  . Urine Culture, Reflex  . MM Digital Screening  . UA/M w/rflx Culture, Routine  . TSH + free T4  . B12  . Vitamin D (25 hydroxy)  . POCT HgB A1C  . Home sleep test    Meds ordered this encounter  Medications  . buPROPion (WELLBUTRIN XL) 150 MG 24 hr tablet    Sig: Take 1 tablet (150 mg total) by mouth daily.    Dispense:  90 tablet    Refill:  0  . diazepam (VALIUM) 5 MG tablet    Sig: Take 1 tablet (5 mg total) by mouth daily as needed for anxiety.    Dispense:  10 tablet    Refill:  0  . cyanocobalamin (,VITAMIN B-12,) 1000 MCG/ML injection    Sig: Inject 1 mL (1,000 mcg total) into the muscle  every 30 (thirty) days.    Dispense:  10 mL    Refill:  0  . DISCONTD: meloxicam (MOBIC) 7.5 MG tablet    Sig: Take 1 tablet (7.5 mg total) by mouth 2 (two) times daily as needed for pain.    Dispense:  60 tablet    Refill:  3  . hydrochlorothiazide (HYDRODIURIL) 25 MG tablet    Sig: Take 1 tablet (25 mg total) by mouth daily.    Dispense:  30 tablet    Refill:  3  . rosuvastatin (CRESTOR) 5 MG tablet    Sig: Take one tab every other day for cholesterol    Dispense:  45 tablet    Refill:  3  . meloxicam (MOBIC) 15 MG tablet    Sig: Take 1 tablet (15 mg total) by mouth daily.    Dispense:  30 tablet    Refill:  3    Total time spent: 40 Minutes  Time spent includes review of chart, medications, test results, and follow up plan with the patient.   This patient was seen by Theodoro Grist AGNP-C Collaboration with Dr Lavera Guise as a part of collaborative care agreement   Tanna Furry. [facility] Internal Medicine

## 2020-06-01 ENCOUNTER — Encounter: Payer: Self-pay | Admitting: Hospice and Palliative Medicine

## 2020-06-01 ENCOUNTER — Other Ambulatory Visit: Payer: Self-pay | Admitting: Hospice and Palliative Medicine

## 2020-06-01 LAB — VITAMIN D 25 HYDROXY (VIT D DEFICIENCY, FRACTURES): Vit D, 25-Hydroxy: 9.7 ng/mL — ABNORMAL LOW (ref 30.0–100.0)

## 2020-06-01 LAB — VITAMIN B12: Vitamin B-12: 367 pg/mL (ref 232–1245)

## 2020-06-01 LAB — TSH+FREE T4
Free T4: 0.91 ng/dL (ref 0.82–1.77)
TSH: 0.569 u[IU]/mL (ref 0.450–4.500)

## 2020-06-01 MED ORDER — VITAMIN D (ERGOCALCIFEROL) 1.25 MG (50000 UNIT) PO CAPS: 50000.0000 [IU] | ORAL_CAPSULE | ORAL | 1 refills | Status: DC

## 2020-06-01 NOTE — Progress Notes (Signed)
Please call and let her know I have reviewed her labs. She needs to continue with monthly B12 injections and I am sending Vitamin D she needs to start weekly.

## 2020-06-04 LAB — UA/M W/RFLX CULTURE, ROUTINE
Bilirubin, UA: NEGATIVE
Glucose, UA: NEGATIVE
Ketones, UA: NEGATIVE
Nitrite, UA: NEGATIVE
Protein,UA: NEGATIVE
RBC, UA: NEGATIVE
Specific Gravity, UA: 1.012 (ref 1.005–1.030)
Urobilinogen, Ur: 0.2 mg/dL (ref 0.2–1.0)
pH, UA: 6 (ref 5.0–7.5)

## 2020-06-04 LAB — MICROSCOPIC EXAMINATION
Bacteria, UA: NONE SEEN
Casts: NONE SEEN /lpf

## 2020-06-04 LAB — URINE CULTURE, REFLEX

## 2020-06-13 ENCOUNTER — Other Ambulatory Visit: Payer: Self-pay | Admitting: Hospice and Palliative Medicine

## 2020-06-21 ENCOUNTER — Other Ambulatory Visit: Payer: Self-pay

## 2020-06-21 ENCOUNTER — Ambulatory Visit
Admission: RE | Admit: 2020-06-21 | Discharge: 2020-06-21 | Disposition: A | Discharge status: Home / Self Care | Payer: Medicaid Other | Source: Ambulatory Visit | Attending: Hospice and Palliative Medicine | Admitting: Hospice and Palliative Medicine

## 2020-06-21 DIAGNOSIS — Z1231 Encounter for screening mammogram for malignant neoplasm of breast: Secondary | ICD-10-CM | POA: Insufficient documentation

## 2020-06-22 NOTE — Progress Notes (Signed)
Normal mammogram

## 2020-07-05 ENCOUNTER — Other Ambulatory Visit: Payer: Self-pay | Admitting: Hospice and Palliative Medicine

## 2020-07-07 ENCOUNTER — Telehealth: Payer: Self-pay

## 2020-07-07 NOTE — Telephone Encounter (Signed)
Pt's daughter called and was requesting refill on Diazepam and gemfibrozil.  Per [facility] pt will need to wait til her OV appt for refill on Diazepam and daughter was informed that pt was to start weaning off of diazepam as the last OV she was not prescribing anymore.  The Gemfibrozil pt was taken off the medication in Dec 2021 and was started on Crestor.  Pt's daughter will have pt start on the Crestor and will wait til OV with pt to discuss the diazepam.

## 2020-07-12 ENCOUNTER — Encounter: Payer: Self-pay | Admitting: Hospice and Palliative Medicine

## 2020-07-12 ENCOUNTER — Other Ambulatory Visit: Payer: Self-pay

## 2020-07-12 ENCOUNTER — Ambulatory Visit (INDEPENDENT_AMBULATORY_CARE_PROVIDER_SITE_OTHER): Payer: Medicaid Other | Admitting: Hospice and Palliative Medicine

## 2020-07-12 VITALS — BP 102/56 | HR 65 | Temp 98.2°F | Resp 16 | Ht 68.0 in | Wt 218.6 lb

## 2020-07-12 DIAGNOSIS — E538 Deficiency of other specified B group vitamins: Secondary | ICD-10-CM

## 2020-07-12 DIAGNOSIS — K219 Gastro-esophageal reflux disease without esophagitis: Secondary | ICD-10-CM | POA: Diagnosis not present

## 2020-07-12 DIAGNOSIS — E039 Hypothyroidism, unspecified: Secondary | ICD-10-CM | POA: Diagnosis not present

## 2020-07-12 DIAGNOSIS — E1165 Type 2 diabetes mellitus with hyperglycemia: Secondary | ICD-10-CM | POA: Diagnosis not present

## 2020-07-12 DIAGNOSIS — R41840 Attention and concentration deficit: Secondary | ICD-10-CM

## 2020-07-12 DIAGNOSIS — F339 Major depressive disorder, recurrent, unspecified: Secondary | ICD-10-CM

## 2020-07-12 MED ORDER — BUPROPION HCL ER (XL) 150 MG PO TB24: 150.0000 mg | ORAL_TABLET | Freq: Every day | ORAL | 1 refills | Status: AC

## 2020-07-12 MED ORDER — OMEPRAZOLE 40 MG PO CPDR: 40.0000 mg | DELAYED_RELEASE_CAPSULE | Freq: Every day | ORAL | 3 refills | Status: AC

## 2020-07-12 MED ORDER — LEVOTHYROXINE SODIUM 75 MCG PO TABS: 75.0000 ug | ORAL_TABLET | Freq: Every day | ORAL | 3 refills | Status: DC

## 2020-07-12 MED ORDER — CYANOCOBALAMIN 1000 MCG/ML IJ SOLN
1000.0000 ug | INTRAMUSCULAR | 0 refills | Status: AC
Start: 2020-07-12 — End: ?

## 2020-07-12 MED ORDER — LIRAGLUTIDE 18 MG/3ML ~~LOC~~ SOPN
PEN_INJECTOR | SUBCUTANEOUS | 12 refills | Status: DC
Start: 2020-07-12 — End: 2020-09-27

## 2020-07-12 NOTE — Progress Notes (Signed)
[facility] [place], [place] [address]  Internal MEDICINE  Office Visit Note  Patient Name: Holly Espinoza  [id number]  [id number]  Date of Service: 07/14/2020  Chief Complaint  Patient presents with  . Follow-up  . Anemia  . Anxiety  . Arthritis  . Cancer  . Diabetes  . Depression  . Hyperlipidemia    HPI Patient is here for routine follow-up Daughter complaining of her being "scattered" and unable to focus on one topic--explains these symptoms have been going on for several years Last head CT 2021--unremarkable exam Wellbutrin has improved symptoms of depression Scheduled for her home sleep study later this month Followed by pain management for chronic back pain   Current Medication: Outpatient Encounter Medications as of 07/12/2020  Medication Sig  . allopurinol (ZYLOPRIM) 100 MG tablet Take 2 tab po at bedtime  . AMITIZA 24 MCG capsule Take 24 mcg by mouth 2 (two) times daily.  Marland Kitchen aspirin 81 MG chewable tablet Chew 81 mg by mouth daily.  . bisoprolol-hydrochlorothiazide (ZIAC) 5-6.25 MG tablet TAKE 1 TABLET BY MOUTH EVERY DAY FOR BLOOD PRESSURE  . diazepam (VALIUM) 5 MG tablet Take 1 tablet (5 mg total) by mouth daily as needed for anxiety.  . DULoxetine (CYMBALTA) 60 MG capsule Take 1 capsule (60 mg total) by mouth 2 (two) times daily.  . hydrochlorothiazide (HYDRODIURIL) 25 MG tablet Take 1 tablet (25 mg total) by mouth daily.  . Insulin Pen Needle (PEN NEEDLES 5/16") 30G X 8 MM MISC To use every day with victoza injections - E11.65  . meloxicam (MOBIC) 15 MG tablet Take 1 tablet (15 mg total) by mouth daily.  . ondansetron (ZOFRAN) 4 MG tablet Take 1 tablet (4 mg total) by mouth every 8 (eight) hours as needed for nausea. For nausea  . oxyCODONE-acetaminophen (PERCOCET) 10-325 MG tablet Take 1 tablet by mouth every 8 (eight) hours.  . pregabalin (LYRICA) 50 MG capsule Take 50 mg by mouth 2 (two) times daily.  . rosuvastatin (CRESTOR) 5 MG  tablet Take one tab every other day for cholesterol  . SYMBICORT 80-4.5 MCG/ACT inhaler INHALE 2 PUFFS INTO THE LUNGS TWICE DAILY  . topiramate (TOPAMAX) 100 MG tablet Take 100 mg by mouth 2 (two) times daily.  . Vitamin D, Ergocalciferol, (DRISDOL) 1.25 MG (50000 UNIT) CAPS capsule Take 1 capsule (50,000 Units total) by mouth every 7 (seven) days.  Marland Kitchen XTAMPZA ER 9 MG C12A Take 1 capsule by mouth 2 (two) times daily.  . [DISCONTINUED] buPROPion (WELLBUTRIN XL) 150 MG 24 hr tablet Take 1 tablet (150 mg total) by mouth daily.  . [DISCONTINUED] cyanocobalamin (,VITAMIN B-12,) 1000 MCG/ML injection Inject 1 mL (1,000 mcg total) into the muscle every 30 (thirty) days.  . [DISCONTINUED] levothyroxine (SYNTHROID) 75 MCG tablet Take 1 tablet (75 mcg total) by mouth daily before breakfast.  . [DISCONTINUED] liraglutide (VICTOZA) 18 MG/3ML SOPN Inject  1.8 mg [place]  daily  . [DISCONTINUED] omeprazole (PRILOSEC) 40 MG capsule Take 1 capsule (40 mg total) by mouth daily.  Marland Kitchen buPROPion (WELLBUTRIN XL) 150 MG 24 hr tablet Take 1 tablet (150 mg total) by mouth daily.  . cyanocobalamin (,VITAMIN B-12,) 1000 MCG/ML injection Inject 1 mL (1,000 mcg total) into the muscle every 30 (thirty) days.  Marland Kitchen levothyroxine (SYNTHROID) 75 MCG tablet Take 1 tablet (75 mcg total) by mouth daily before breakfast.  . liraglutide (VICTOZA) 18 MG/3ML SOPN Inject  1.8 mg [place]  daily  . omeprazole (PRILOSEC) 40  MG capsule Take 1 capsule (40 mg total) by mouth daily.   No facility-administered encounter medications on file as of 07/12/2020.    Surgical History: Past Surgical History:  Procedure Laterality Date  . ABDOMINAL HYSTERECTOMY  2001?  . ANTERIOR AND POSTERIOR REPAIR N/A 11/01/2015   Procedure: CYSTOSCOPY, ANTERIOR (CYSTOCELE) AND POSTERIOR REPAIR (RECTOCELE);  Surgeon: Bjorn Loser, MD;  Location: [facility];  Service: Urology;  Laterality: N/A;  . ANTERIOR CERVICAL DECOMP/DISCECTOMY FUSION  2012  . BACK SURGERY  2001    laminectomy  . BREAST BIOPSY Left 2014  . BREAST LUMPECTOMY Left 02/25/2013  . BREAST SURGERY Left 02/25/13   lumpectomy  . CHOLECYSTECTOMY  1980's  . DILATION AND CURETTAGE OF UTERUS  [phone]   "probably 3" (07/08/2012)  . FRACTURE SURGERY     C4-7 Surgery for fracture  . LUMBAR WOUND DEBRIDEMENT N/A 08/12/2012   Procedure: LUMBAR WOUND DEBRIDEMENT;  Surgeon: Faythe Ghee, MD;  Location: [facility];  Service: Neurosurgery;  Laterality: N/A;  Lumbar Wound Debridement  . POSTERIOR LUMBAR FUSION  07/08/2012  . TONSILLECTOMY  ~ 1968    Medical History: Past Medical History:  Diagnosis Date  . Anxiety   . Arthritis    "pretty much from my neck to my feet" (07/08/2012)  . B12 deficiency anemia   . Breast cancer ([place])    left  . Chronic lower back pain   . Depression   . Diabetic peripheral neuropathy ([place])   . Gout   . High cholesterol   . IDA (iron deficiency anemia) 06/17/2019  . 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 Neuromuscular disorder ([place])   . Rheumatoid arthritis ([place])   . Type II diabetes mellitus (HCC)    recent hgb A1C was 5.4 per Dr. Humphrey Rolls, [place], [place]    Family History: Family History  Problem Relation Age of Onset  . Kidney disease Father   . Heart disease Father   . Diabetes Father   . Hypertension Mother   . Depression Mother   . Cancer Other        breast great grandmother  . Breast cancer Other   . COPD Brother   . Heart failure Brother   . Diabetes Brother   . COPD Brother   . Breast cancer Maternal Aunt        mat great great aunt    Social History   Socioeconomic History  . Marital status: Single    Spouse name: Not on file  . Number of children: Not on file  . Years of education: Not on file  . Highest education level: Not on file  Occupational History  . Occupation: disability  Tobacco Use  . Smoking status: Current Every Day Smoker    Packs/day: 0.75    Years: 45.00    Pack years: 33.75    Types:  Cigarettes  . Smokeless tobacco: Never Used  Vaping Use  . Vaping Use: Never used  Substance and Sexual Activity  . Alcohol use: No  . Drug use: No    Types: Marijuana, Cocaine    Comment: 07/08/2012 "last drug use >8 yr ago" (07/08/2012)  . Sexual activity: Yes    Comment: quit a long time ago  Other Topics Concern  . Not on file  Social History Narrative  . Not on file   Social Determinants of Health   Financial Resource Strain: Not on file  Food Insecurity: Not on file  Transportation Needs: Not  on file  Physical Activity: Not on file  Stress: Not on file  Social Connections: Not on file  Intimate Partner Violence: Not on file      Review of Systems  Constitutional: Negative for chills, diaphoresis and fatigue.  HENT: Negative for ear pain, postnasal drip and sinus pressure.   Eyes: Negative for photophobia, discharge, redness, itching and visual disturbance.  Respiratory: Negative for cough, shortness of breath and wheezing.   Cardiovascular: Negative for chest pain, palpitations and leg swelling.  Gastrointestinal: Negative for abdominal pain, constipation, diarrhea, nausea and vomiting.  Genitourinary: Negative for dysuria and flank pain.  Musculoskeletal: Negative for arthralgias, back pain, gait problem and neck pain.  Skin: Negative for color change.  Allergic/Immunologic: Negative for environmental allergies and food allergies.  Neurological: Negative for dizziness and headaches.  Hematological: Does not bruise/bleed easily.  Psychiatric/Behavioral: Positive for decreased concentration. Negative for agitation, behavioral problems (depression) and hallucinations.    Vital Signs: BP (!) 102/56   Pulse 65   Temp 98.2 F (36.8 C)   Resp 16   Ht 5\' 8"  (1.727 m)   Wt 218 lb 9.6 oz (99.2 kg)   SpO2 99%   BMI 33.24 kg/m    Physical Exam Vitals reviewed.  Constitutional:      Appearance: Normal appearance. She is normal weight.  Cardiovascular:     Rate  and Rhythm: Normal rate and regular rhythm.     Pulses: Normal pulses.     Heart sounds: Normal heart sounds.  Pulmonary:     Effort: Pulmonary effort is normal.     Breath sounds: Normal breath sounds.  Abdominal:     General: Abdomen is flat.     Palpations: Abdomen is soft.  Musculoskeletal:        General: Normal range of motion.     Cervical back: Normal range of motion.  Skin:    General: Skin is warm.  Neurological:     General: No focal deficit present.     Mental Status: She is alert and oriented to person, place, and time. Mental status is at baseline.  Psychiatric:        Mood and Affect: Mood normal.        Behavior: Behavior normal.        Thought Content: Thought content normal.        Judgment: Judgment normal.   Assessment/Plan: 1. Uncontrolled type 2 diabetes mellitus with hyperglycemia (HCC) Next A1C due in June Requesting refills of Victoza - liraglutide (VICTOZA) 18 MG/3ML SOPN; Inject  1.8 mg [place]  daily  Dispense: 3 mL; Refill: 12  2. Gastroesophageal reflux disease without esophagitis Symptoms remain well controlled, requesting refills - omeprazole (PRILOSEC) 40 MG capsule; Take 1 capsule (40 mg total) by mouth daily.  Dispense: 30 capsule; Refill: 3  3. Vitamin B12 deficiency Requesting refills - cyanocobalamin (,VITAMIN B-12,) 1000 MCG/ML injection; Inject 1 mL (1,000 mcg total) into the muscle every 30 (thirty) days.  Dispense: 10 mL; Refill: 0  4. Acquired hypothyroidism Levels remain well controlled on current dosing, requesting refills - levothyroxine (SYNTHROID) 75 MCG tablet; Take 1 tablet (75 mcg total) by mouth daily before breakfast.  Dispense: 30 tablet; Refill: 3  5. Depression, recurrent ([place]) Symptoms improved with addition of Wellbutrin, requesting refills - buPROPion (WELLBUTRIN XL) 150 MG 24 hr tablet; Take 1 tablet (150 mg total) by mouth daily.  Dispense: 90 tablet; Refill: 1  6. Lack of concentration Likely multi-factorial in  origin, non acute  will hold off of CT scan for now Underlying CSA possibly contributing, scheduled for home sleep study later this month  General Counseling: Warden Fillers understanding of the findings of todays visit and agrees with plan of treatment. I have discussed any further diagnostic evaluation that may be needed or ordered today. We also reviewed her medications today. she has been encouraged to call the office with any questions or concerns that should arise related to todays visit.   Meds ordered this encounter  Medications  . omeprazole (PRILOSEC) 40 MG capsule    Sig: Take 1 capsule (40 mg total) by mouth daily.    Dispense:  30 capsule    Refill:  3  . liraglutide (VICTOZA) 18 MG/3ML SOPN    Sig: Inject  1.8 mg [place]  daily    Dispense:  3 mL    Refill:  12  . cyanocobalamin (,VITAMIN B-12,) 1000 MCG/ML injection    Sig: Inject 1 mL (1,000 mcg total) into the muscle every 30 (thirty) days.    Dispense:  10 mL    Refill:  0  . levothyroxine (SYNTHROID) 75 MCG tablet    Sig: Take 1 tablet (75 mcg total) by mouth daily before breakfast.    Dispense:  30 tablet    Refill:  3  . buPROPion (WELLBUTRIN XL) 150 MG 24 hr tablet    Sig: Take 1 tablet (150 mg total) by mouth daily.    Dispense:  90 tablet    Refill:  1    Time spent:30 Minutes Time spent includes review of chart, medications, test results and follow-up plan with the patient.  This patient was seen by Theodoro Grist AGNP-C in Collaboration with Dr Lavera Guise as a part of collaborative care agreement     Tanna Furry. Frankee Gritz AGNP-C Internal medicine

## 2020-07-14 ENCOUNTER — Encounter: Payer: Self-pay | Admitting: Hospice and Palliative Medicine

## 2020-07-20 ENCOUNTER — Telehealth: Payer: Self-pay

## 2020-07-20 ENCOUNTER — Other Ambulatory Visit: Payer: Self-pay

## 2020-07-20 MED ORDER — DOXYCYCLINE MONOHYDRATE 100 MG PO TABS: 100.0000 mg | ORAL_TABLET | Freq: Two times a day (BID) | ORAL | 0 refills | Status: DC

## 2020-07-20 MED ORDER — DOXYCYCLINE HYCLATE 100 MG PO TABS: 100.0000 mg | ORAL_TABLET | Freq: Two times a day (BID) | ORAL | 0 refills | Status: AC

## 2020-07-20 MED ORDER — MUPIROCIN 2 % EX OINT: 1.0000 "application " | TOPICAL_OINTMENT | Freq: Two times a day (BID) | CUTANEOUS | 0 refills | Status: AC

## 2020-07-20 NOTE — Telephone Encounter (Signed)
As per dr Humphrey Rolls send medication and pt advised by vanessa that we send antibiotic and ointment

## 2020-07-20 NOTE — Telephone Encounter (Signed)
-----   Message from Lavera Guise, MD sent at 07/20/2020  2:42 PM EDT ----- Call in doxy 100 mg po bid X 10 days # 20 and Mupricion bid to the affected area .Marland Kitchen # 15 gr no refills  ----- Message ----- From: Gerrie Nordmann, RN Sent: 07/20/2020  12:08 PM EDT To: Lavera Guise, MD  Patient called to see if we can schedule her in for tomorrow for blisters in her nose. She stated it may be MRSA would you say the pt can come in or would you prefer a virtual for this pt?  Thanks in advance!  - Lorriane Shire

## 2020-07-20 NOTE — Telephone Encounter (Signed)
Daughter called and stated that the doxycycline we sent in needed a PA, I called pharmacy and the one we sent in wasn't covered by medicaid.  I resent another type of doxycycline and called pharmacy to make sure it went thru and called pt's daughter back and let her know it went thru

## 2020-07-23 ENCOUNTER — Other Ambulatory Visit: Payer: Self-pay | Admitting: Internal Medicine

## 2020-07-23 DIAGNOSIS — E039 Hypothyroidism, unspecified: Secondary | ICD-10-CM

## 2020-07-27 ENCOUNTER — Other Ambulatory Visit: Payer: Self-pay | Admitting: Internal Medicine

## 2020-07-27 DIAGNOSIS — F410 Panic disorder [episodic paroxysmal anxiety] without agoraphobia: Secondary | ICD-10-CM

## 2020-08-03 ENCOUNTER — Other Ambulatory Visit: Payer: Self-pay

## 2020-08-03 MED ORDER — VITAMIN D (ERGOCALCIFEROL) 1.25 MG (50000 UNIT) PO CAPS: 50000.0000 [IU] | ORAL_CAPSULE | ORAL | 1 refills | Status: DC

## 2020-08-23 ENCOUNTER — Ambulatory Visit: Payer: Medicaid Other | Admitting: Nurse Practitioner

## 2020-08-31 ENCOUNTER — Other Ambulatory Visit: Payer: Self-pay | Admitting: Internal Medicine

## 2020-08-31 ENCOUNTER — Other Ambulatory Visit: Payer: Self-pay

## 2020-08-31 MED ORDER — BUDESONIDE-FORMOTEROL FUMARATE 80-4.5 MCG/ACT IN AERO: 2.0000 | INHALATION_SPRAY | Freq: Two times a day (BID) | RESPIRATORY_TRACT | 3 refills | Status: AC

## 2020-09-11 ENCOUNTER — Other Ambulatory Visit: Payer: Self-pay | Admitting: Nurse Practitioner

## 2020-09-11 DIAGNOSIS — K219 Gastro-esophageal reflux disease without esophagitis: Secondary | ICD-10-CM

## 2020-09-27 ENCOUNTER — Other Ambulatory Visit: Payer: Self-pay

## 2020-09-27 DIAGNOSIS — F410 Panic disorder [episodic paroxysmal anxiety] without agoraphobia: Secondary | ICD-10-CM

## 2020-09-27 DIAGNOSIS — E1165 Type 2 diabetes mellitus with hyperglycemia: Secondary | ICD-10-CM

## 2020-09-27 MED ORDER — DULOXETINE HCL 60 MG PO CPEP: 60.0000 mg | ORAL_CAPSULE | Freq: Two times a day (BID) | ORAL | 1 refills | Status: DC

## 2020-09-27 MED ORDER — LIRAGLUTIDE 18 MG/3ML ~~LOC~~ SOPN: PEN_INJECTOR | SUBCUTANEOUS | 2 refills | Status: DC

## 2020-09-29 ENCOUNTER — Other Ambulatory Visit: Payer: Self-pay

## 2020-09-29 DIAGNOSIS — I10 Essential (primary) hypertension: Secondary | ICD-10-CM

## 2020-09-29 MED ORDER — HYDROCHLOROTHIAZIDE 25 MG PO TABS: 25.0000 mg | ORAL_TABLET | Freq: Every day | ORAL | 1 refills | Status: AC

## 2020-10-15 ENCOUNTER — Other Ambulatory Visit: Payer: Self-pay | Admitting: Internal Medicine

## 2020-10-24 ENCOUNTER — Other Ambulatory Visit: Payer: Self-pay | Admitting: Internal Medicine

## 2020-10-24 DIAGNOSIS — E1165 Type 2 diabetes mellitus with hyperglycemia: Secondary | ICD-10-CM

## 2020-10-31 ENCOUNTER — Telehealth: Payer: Self-pay

## 2020-10-31 NOTE — Telephone Encounter (Signed)
PATIENTS DAUGHTER CALLED TO CANCEL APPT. MOM WAS SUPER DEHYDRATED AND SHE CALLED THE AMBULENCE TO SEEK FURTHER MEDICAL ATTENTION AS MOM DID NOT LOOK GOOD ACCORDING TO PT.

## 2020-11-01 ENCOUNTER — Ambulatory Visit: Payer: Medicaid Other | Admitting: Nurse Practitioner

## 2020-11-16 ENCOUNTER — Telehealth: Payer: Self-pay

## 2020-11-16 NOTE — Telephone Encounter (Signed)
Received a [facility] order for patient. Patient has not been seen since 07-2020. Called patient to reschedule last cancelled appointment and patient stated she got really sick and had to move in with her daughter in [place]. She states she is not sure if she will be able to continue her primary care here as she has moved out of the area. She advised me she will have her daughter contact our office tomorrow. At this time provider will not sign off on patients home health orders under it is clarified if patient is continuing their primary care with [facility].

## 2020-11-22 ENCOUNTER — Other Ambulatory Visit: Payer: Self-pay | Admitting: Internal Medicine

## 2020-11-22 DIAGNOSIS — E1165 Type 2 diabetes mellitus with hyperglycemia: Secondary | ICD-10-CM

## 2020-11-24 ENCOUNTER — Other Ambulatory Visit: Payer: Self-pay | Admitting: Internal Medicine

## 2020-11-24 DIAGNOSIS — F410 Panic disorder [episodic paroxysmal anxiety] without agoraphobia: Secondary | ICD-10-CM

## 2020-11-28 ENCOUNTER — Other Ambulatory Visit: Payer: Self-pay | Admitting: Nurse Practitioner

## 2020-11-28 DIAGNOSIS — M1A9XX0 Chronic gout, unspecified, without tophus (tophi): Secondary | ICD-10-CM

## 2020-11-30 ENCOUNTER — Other Ambulatory Visit: Payer: Self-pay

## 2020-11-30 ENCOUNTER — Telehealth: Payer: Self-pay

## 2020-11-30 ENCOUNTER — Other Ambulatory Visit: Payer: Self-pay | Admitting: Internal Medicine

## 2020-11-30 MED ORDER — BISOPROLOL-HYDROCHLOROTHIAZIDE 5-6.25 MG PO TABS: ORAL_TABLET | ORAL | 0 refills | Status: AC

## 2020-11-30 NOTE — Telephone Encounter (Signed)
Called and spoke to pt's daughter Thomasene Mohair, and informed her that we can send a 30 day supply of her Ziac to her requested pharmacy [facility] [phone] [place].  Due to pt having a new PCP she will need to get other prescriptions from her new PCP.  Daughter asked about having records sent to new PCP and I informed daughter that Rob Hickman is with [employer] and they can review her medical records in the Epic system

## 2020-12-05 ENCOUNTER — Other Ambulatory Visit: Payer: Self-pay | Admitting: Internal Medicine

## 2020-12-05 DIAGNOSIS — I10 Essential (primary) hypertension: Secondary | ICD-10-CM

## 2020-12-13 ENCOUNTER — Other Ambulatory Visit: Payer: Self-pay | Admitting: Internal Medicine

## 2020-12-13 DIAGNOSIS — E1165 Type 2 diabetes mellitus with hyperglycemia: Secondary | ICD-10-CM

## 2020-12-23 ENCOUNTER — Other Ambulatory Visit: Payer: Self-pay | Admitting: Internal Medicine

## 2020-12-23 DIAGNOSIS — E1165 Type 2 diabetes mellitus with hyperglycemia: Secondary | ICD-10-CM

## 2020-12-31 ENCOUNTER — Other Ambulatory Visit: Payer: Self-pay | Admitting: Internal Medicine

## 2021-05-15 ENCOUNTER — Telehealth: Payer: Self-pay | Admitting: Acute Care

## 2021-05-15 NOTE — Telephone Encounter (Signed)
Left message for pt to call back to schedule f/u lung screening CT scan.  ?

## 2021-06-27 ENCOUNTER — Telehealth: Payer: Self-pay | Admitting: *Deleted

## 2021-06-27 NOTE — Telephone Encounter (Signed)
Left message for pt to call back to schedule f/u lung screening CT scan.  ?

## 2021-07-13 ENCOUNTER — Other Ambulatory Visit: Payer: Self-pay | Admitting: Internal Medicine

## 2021-07-13 DIAGNOSIS — F410 Panic disorder [episodic paroxysmal anxiety] without agoraphobia: Secondary | ICD-10-CM
